# Patient Record
Sex: Female | Born: 1945
Health system: Southern US, Community
[De-identification: ages and names within clinical notes are randomized; demographics above are authoritative.]

## PROBLEM LIST (undated history)

## (undated) DIAGNOSIS — R059 Cough, unspecified: Secondary | ICD-10-CM

## (undated) DIAGNOSIS — K644 Residual hemorrhoidal skin tags: Secondary | ICD-10-CM

## (undated) DIAGNOSIS — I10 Essential (primary) hypertension: Secondary | ICD-10-CM

## (undated) DIAGNOSIS — K219 Gastro-esophageal reflux disease without esophagitis: Secondary | ICD-10-CM

## (undated) DIAGNOSIS — K589 Irritable bowel syndrome without diarrhea: Secondary | ICD-10-CM

## (undated) DIAGNOSIS — G4730 Sleep apnea, unspecified: Secondary | ICD-10-CM

## (undated) DIAGNOSIS — F329 Major depressive disorder, single episode, unspecified: Secondary | ICD-10-CM

## (undated) DIAGNOSIS — R112 Nausea with vomiting, unspecified: Secondary | ICD-10-CM

## (undated) DIAGNOSIS — L719 Rosacea, unspecified: Secondary | ICD-10-CM

## (undated) DIAGNOSIS — F419 Anxiety disorder, unspecified: Secondary | ICD-10-CM

## (undated) DIAGNOSIS — E785 Hyperlipidemia, unspecified: Secondary | ICD-10-CM

## (undated) DIAGNOSIS — T783XXA Angioneurotic edema, initial encounter: Secondary | ICD-10-CM

## (undated) DIAGNOSIS — F32A Depression, unspecified: Secondary | ICD-10-CM

## (undated) DIAGNOSIS — M1990 Unspecified osteoarthritis, unspecified site: Secondary | ICD-10-CM

## (undated) DIAGNOSIS — R05 Cough: Secondary | ICD-10-CM

## (undated) DIAGNOSIS — T782XXA Anaphylactic shock, unspecified, initial encounter: Principal | ICD-10-CM

## (undated) DIAGNOSIS — K648 Other hemorrhoids: Secondary | ICD-10-CM

## (undated) DIAGNOSIS — Z205 Contact with and (suspected) exposure to viral hepatitis: Secondary | ICD-10-CM

## (undated) DIAGNOSIS — Z9889 Other specified postprocedural states: Secondary | ICD-10-CM

## (undated) HISTORY — DX: Irritable bowel syndrome, unspecified: K58.9

## (undated) HISTORY — DX: Unspecified osteoarthritis, unspecified site: M19.90

## (undated) HISTORY — DX: Anaphylactic shock, unspecified, initial encounter: T78.2XXA

## (undated) HISTORY — PX: COSMETIC SURGERY: SHX468

## (undated) HISTORY — DX: Other hemorrhoids: K64.8

## (undated) HISTORY — DX: Residual hemorrhoidal skin tags: K64.4

## (undated) HISTORY — DX: Hyperlipidemia, unspecified: E78.5

## (undated) HISTORY — DX: Cough, unspecified: R05.9

## (undated) HISTORY — DX: Angioneurotic edema, initial encounter: T78.3XXA

## (undated) HISTORY — DX: Anxiety disorder, unspecified: F41.9

## (undated) HISTORY — DX: Rosacea, unspecified: L71.9

## (undated) HISTORY — DX: Sleep apnea, unspecified: G47.30

## (undated) HISTORY — DX: Cough: R05

## (undated) HISTORY — DX: Essential (primary) hypertension: I10

## (undated) HISTORY — DX: Contact with and (suspected) exposure to viral hepatitis: Z20.5

## (undated) HISTORY — DX: Depression, unspecified: F32.A

## (undated) HISTORY — DX: Major depressive disorder, single episode, unspecified: F32.9

## (undated) HISTORY — PX: TOTAL SHOULDER REPLACEMENT: SUR1217

---
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
## 1998-01-28 ENCOUNTER — Encounter: Admission: RE | Admit: 1998-01-28 | Discharge: 1998-04-28 | Payer: Self-pay | Admitting: Family Medicine

## 1998-09-26 ENCOUNTER — Ambulatory Visit (HOSPITAL_BASED_OUTPATIENT_CLINIC_OR_DEPARTMENT_OTHER): Admission: RE | Admit: 1998-09-26 | Discharge: 1998-09-26 | Payer: Self-pay | Admitting: Specialist

## 1998-10-15 HISTORY — PX: ABDOMINOPLASTY: SUR9

## 1999-02-23 ENCOUNTER — Other Ambulatory Visit: Admission: RE | Admit: 1999-02-23 | Discharge: 1999-02-23 | Payer: Self-pay | Admitting: Family Medicine

## 2000-03-18 ENCOUNTER — Encounter: Payer: Self-pay | Admitting: Family Medicine

## 2000-03-18 ENCOUNTER — Encounter: Admission: RE | Admit: 2000-03-18 | Discharge: 2000-03-18 | Payer: Self-pay | Admitting: Family Medicine

## 2000-10-15 HISTORY — PX: REDUCTION MAMMAPLASTY: SUR839

## 2001-02-26 ENCOUNTER — Other Ambulatory Visit: Admission: RE | Admit: 2001-02-26 | Discharge: 2001-02-26 | Payer: Self-pay | Admitting: Obstetrics and Gynecology

## 2002-03-05 ENCOUNTER — Encounter: Payer: Self-pay | Admitting: Family Medicine

## 2002-03-05 ENCOUNTER — Other Ambulatory Visit: Admission: RE | Admit: 2002-03-05 | Discharge: 2002-03-05 | Payer: Self-pay | Admitting: *Deleted

## 2002-07-16 ENCOUNTER — Encounter: Admission: RE | Admit: 2002-07-16 | Discharge: 2002-07-16 | Payer: Self-pay | Admitting: *Deleted

## 2002-07-16 ENCOUNTER — Encounter: Payer: Self-pay | Admitting: *Deleted

## 2002-07-22 ENCOUNTER — Inpatient Hospital Stay (HOSPITAL_COMMUNITY): Admission: EM | Admit: 2002-07-22 | Discharge: 2002-07-23 | Payer: Self-pay | Admitting: Emergency Medicine

## 2002-07-23 ENCOUNTER — Encounter (INDEPENDENT_AMBULATORY_CARE_PROVIDER_SITE_OTHER): Payer: Self-pay | Admitting: *Deleted

## 2002-10-15 HISTORY — PX: APPENDECTOMY: SHX54

## 2003-04-27 ENCOUNTER — Encounter: Admission: RE | Admit: 2003-04-27 | Discharge: 2003-07-26 | Payer: Self-pay | Admitting: *Deleted

## 2004-05-12 ENCOUNTER — Encounter: Payer: Self-pay | Admitting: Family Medicine

## 2004-05-12 ENCOUNTER — Other Ambulatory Visit: Admission: RE | Admit: 2004-05-12 | Discharge: 2004-05-12 | Payer: Self-pay | Admitting: *Deleted

## 2004-05-24 ENCOUNTER — Encounter: Admission: RE | Admit: 2004-05-24 | Discharge: 2004-05-24 | Payer: Self-pay | Admitting: *Deleted

## 2005-12-19 ENCOUNTER — Ambulatory Visit (HOSPITAL_COMMUNITY): Admission: RE | Admit: 2005-12-19 | Discharge: 2005-12-19 | Payer: Self-pay | Admitting: *Deleted

## 2006-02-20 ENCOUNTER — Encounter: Admission: RE | Admit: 2006-02-20 | Discharge: 2006-02-20 | Payer: Self-pay | Admitting: Sports Medicine

## 2006-03-08 ENCOUNTER — Encounter: Admission: RE | Admit: 2006-03-08 | Discharge: 2006-03-08 | Payer: Self-pay | Admitting: Sports Medicine

## 2007-01-17 ENCOUNTER — Ambulatory Visit (HOSPITAL_COMMUNITY): Admission: RE | Admit: 2007-01-17 | Discharge: 2007-01-17 | Payer: Self-pay | Admitting: Pediatric Nephrology

## 2007-10-16 HISTORY — PX: TOTAL SHOULDER REPLACEMENT: SUR1217

## 2008-01-01 ENCOUNTER — Encounter (INDEPENDENT_AMBULATORY_CARE_PROVIDER_SITE_OTHER): Payer: Self-pay | Admitting: *Deleted

## 2008-01-01 LAB — CONVERTED CEMR LAB
AST: 26 units/L
Albumin: 4 g/dL
BUN: 22 mg/dL
Glucose, Bld: 91 mg/dL
HDL: 46 mg/dL
LDL Cholesterol: 135 mg/dL
Sodium, serum: 140 mmol/L
Triglycerides: 179 mg/dL

## 2008-07-22 ENCOUNTER — Ambulatory Visit (HOSPITAL_COMMUNITY): Admission: RE | Admit: 2008-07-22 | Discharge: 2008-07-22 | Payer: Self-pay | Admitting: *Deleted

## 2008-08-16 ENCOUNTER — Encounter (INDEPENDENT_AMBULATORY_CARE_PROVIDER_SITE_OTHER): Payer: Self-pay | Admitting: *Deleted

## 2008-08-20 ENCOUNTER — Ambulatory Visit: Payer: Self-pay | Admitting: Family Medicine

## 2008-08-20 DIAGNOSIS — I10 Essential (primary) hypertension: Secondary | ICD-10-CM | POA: Insufficient documentation

## 2008-08-20 DIAGNOSIS — E785 Hyperlipidemia, unspecified: Secondary | ICD-10-CM

## 2008-08-20 DIAGNOSIS — F32A Depression, unspecified: Secondary | ICD-10-CM | POA: Insufficient documentation

## 2008-08-20 DIAGNOSIS — F329 Major depressive disorder, single episode, unspecified: Secondary | ICD-10-CM

## 2008-08-23 ENCOUNTER — Encounter (INDEPENDENT_AMBULATORY_CARE_PROVIDER_SITE_OTHER): Payer: Self-pay | Admitting: *Deleted

## 2008-08-24 ENCOUNTER — Ambulatory Visit: Payer: Self-pay | Admitting: Family Medicine

## 2008-08-24 ENCOUNTER — Encounter (INDEPENDENT_AMBULATORY_CARE_PROVIDER_SITE_OTHER): Payer: Self-pay | Admitting: *Deleted

## 2008-08-25 LAB — CONVERTED CEMR LAB
BUN: 24 mg/dL — ABNORMAL HIGH (ref 6–23)
CO2: 29 meq/L (ref 19–32)
Calcium: 9.4 mg/dL (ref 8.4–10.5)
Chloride: 106 meq/L (ref 96–112)
Cholesterol: 156 mg/dL (ref 0–200)
Creatinine, Ser: 1 mg/dL (ref 0.4–1.2)
Eosinophils Relative: 1.6 % (ref 0.0–5.0)
Glucose, Bld: 99 mg/dL (ref 70–99)
HDL: 38.8 mg/dL — ABNORMAL LOW (ref >39.0)
Monocytes Absolute: 0.4 10*3/uL (ref 0.1–1.0)
Neutrophils Relative %: 46.7 % (ref 43.0–77.0)
Platelets: 222 10*3/uL (ref 150–400)
Potassium: 4.7 meq/L (ref 3.5–5.1)
RBC: 4.18 M/uL (ref 3.87–5.11)
Sodium: 140 meq/L (ref 135–145)
Total CHOL/HDL Ratio: 4
Triglycerides: 86 mg/dL (ref 0–149)
WBC: 5.9 10*3/uL (ref 4.5–10.5)

## 2008-08-27 ENCOUNTER — Encounter (INDEPENDENT_AMBULATORY_CARE_PROVIDER_SITE_OTHER): Payer: Self-pay | Admitting: *Deleted

## 2008-09-01 ENCOUNTER — Ambulatory Visit: Payer: Self-pay | Admitting: Internal Medicine

## 2008-09-02 ENCOUNTER — Other Ambulatory Visit: Admission: RE | Admit: 2008-09-02 | Discharge: 2008-09-02 | Payer: Self-pay | Admitting: Obstetrics & Gynecology

## 2008-09-15 ENCOUNTER — Ambulatory Visit: Payer: Self-pay | Admitting: Internal Medicine

## 2008-09-15 HISTORY — PX: COLONOSCOPY: SHX5424

## 2008-09-20 ENCOUNTER — Ambulatory Visit: Payer: Self-pay | Admitting: Family Medicine

## 2009-03-21 ENCOUNTER — Encounter (INDEPENDENT_AMBULATORY_CARE_PROVIDER_SITE_OTHER): Payer: Self-pay | Admitting: *Deleted

## 2009-05-02 ENCOUNTER — Ambulatory Visit: Payer: Self-pay | Admitting: Family Medicine

## 2009-05-31 ENCOUNTER — Telehealth (INDEPENDENT_AMBULATORY_CARE_PROVIDER_SITE_OTHER): Payer: Self-pay | Admitting: *Deleted

## 2009-06-01 ENCOUNTER — Ambulatory Visit: Payer: Self-pay | Admitting: Internal Medicine

## 2009-10-04 ENCOUNTER — Ambulatory Visit (HOSPITAL_COMMUNITY): Admission: RE | Admit: 2009-10-04 | Discharge: 2009-10-04 | Payer: Self-pay | Admitting: Obstetrics & Gynecology

## 2009-10-13 ENCOUNTER — Encounter: Admission: RE | Admit: 2009-10-13 | Discharge: 2009-10-13 | Payer: Self-pay | Admitting: Obstetrics & Gynecology

## 2009-10-15 HISTORY — PX: AUGMENTATION MAMMAPLASTY: SUR837

## 2010-01-13 ENCOUNTER — Telehealth (INDEPENDENT_AMBULATORY_CARE_PROVIDER_SITE_OTHER): Payer: Self-pay | Admitting: *Deleted

## 2010-09-06 ENCOUNTER — Encounter: Payer: Self-pay | Admitting: Family Medicine

## 2010-09-06 ENCOUNTER — Encounter (INDEPENDENT_AMBULATORY_CARE_PROVIDER_SITE_OTHER): Payer: Self-pay | Admitting: *Deleted

## 2010-09-06 LAB — CONVERTED CEMR LAB
ALT: 30 units/L
AST: 26 units/L
Albumin: 3.9 g/dL
Alkaline Phosphatase: 78 units/L
BUN: 10 mg/dL
CO2, serum: 30 mmol/L
Chloride, Serum: 104 mmol/L
Glucose, Bld: 87 mg/dL
Potassium, serum: 4.5 mmol/L
Sodium, serum: 140 mmol/L
Total Bilirubin: 0.6 mg/dL
Total Protein: 7 g/dL
WBC, blood: 8.3 10*3/uL

## 2010-09-18 ENCOUNTER — Telehealth (INDEPENDENT_AMBULATORY_CARE_PROVIDER_SITE_OTHER): Payer: Self-pay | Admitting: *Deleted

## 2010-09-18 ENCOUNTER — Ambulatory Visit (HOSPITAL_BASED_OUTPATIENT_CLINIC_OR_DEPARTMENT_OTHER)
Admission: RE | Admit: 2010-09-18 | Discharge: 2010-09-18 | Payer: Self-pay | Source: Home / Self Care | Admitting: Family Medicine

## 2010-09-18 ENCOUNTER — Ambulatory Visit: Payer: Self-pay | Admitting: Family Medicine

## 2010-09-18 ENCOUNTER — Encounter (INDEPENDENT_AMBULATORY_CARE_PROVIDER_SITE_OTHER): Payer: Self-pay | Admitting: *Deleted

## 2010-09-18 ENCOUNTER — Encounter: Payer: Self-pay | Admitting: Family Medicine

## 2010-09-18 DIAGNOSIS — K219 Gastro-esophageal reflux disease without esophagitis: Secondary | ICD-10-CM | POA: Insufficient documentation

## 2010-10-26 ENCOUNTER — Telehealth (INDEPENDENT_AMBULATORY_CARE_PROVIDER_SITE_OTHER): Payer: Self-pay | Admitting: *Deleted

## 2010-11-01 ENCOUNTER — Encounter: Payer: Self-pay | Admitting: Family Medicine

## 2010-11-01 ENCOUNTER — Other Ambulatory Visit: Payer: Self-pay | Admitting: Family Medicine

## 2010-11-01 ENCOUNTER — Ambulatory Visit
Admission: RE | Admit: 2010-11-01 | Discharge: 2010-11-01 | Payer: Self-pay | Source: Home / Self Care | Attending: Family Medicine | Admitting: Family Medicine

## 2010-11-01 LAB — LIPID PANEL
Cholesterol: 222 mg/dL — ABNORMAL HIGH (ref 0–200)
HDL: 52.9 mg/dL (ref >39.00)
Total CHOL/HDL Ratio: 4
Triglycerides: 144 mg/dL (ref 0.0–149.0)
VLDL: 28.8 mg/dL (ref 0.0–40.0)

## 2010-11-01 LAB — LDL CHOLESTEROL, DIRECT: Direct LDL: 143.9 mg/dL

## 2010-11-01 LAB — TSH: TSH: 1.31 u[IU]/mL (ref 0.35–5.50)

## 2010-11-02 ENCOUNTER — Telehealth (INDEPENDENT_AMBULATORY_CARE_PROVIDER_SITE_OTHER): Payer: Self-pay | Admitting: *Deleted

## 2010-11-02 ENCOUNTER — Encounter: Payer: Self-pay | Admitting: Family Medicine

## 2010-11-05 ENCOUNTER — Encounter: Payer: Self-pay | Admitting: Obstetrics & Gynecology

## 2010-11-14 NOTE — Miscellaneous (Signed)
  Clinical Lists Changes  Observations: Added new observation of BILI TOTAL: 0.6 mg/dL (66/03/3015 01:09) Added new observation of ALK PHOS: 78 units/L (09/06/2010 14:32) Added new observation of SGPT (ALT): 30 units/L (09/06/2010 14:32) Added new observation of SGOT (AST): 26 units/L (09/06/2010 14:32) Added new observation of PROTEIN, TOT: 7.0 g/dL (32/35/5732 20:25) Added new observation of ALBUMIN: 3.9 g/dL (42/70/6237 62:83) Added new observation of CALCIUM: 9.6 mg/dL (15/17/6160 73:71) Added new observation of GLUCOSE SER: 87 mg/dL (04/09/9484 46:27) Added new observation of CREATININE: 1.0 mg/dL (03/50/0938 18:29) Added new observation of BUN: 10 mg/dL (93/71/6967 89:38) Added new observation of CO2 TOTAL: 30 mmol/L (09/06/2010 14:32) Added new observation of CHLORIDE: 104 mmol/L (09/06/2010 14:32) Added new observation of POTASSIUM: 4.5 mmol/L (09/06/2010 14:32) Added new observation of SODIUM: 140 mmol/L (09/06/2010 14:32) Added new observation of PLATELETS: 223 10*3/mm3 (09/06/2010 14:32) Added new observation of MCH: 30.4 pg (09/06/2010 14:32) Added new observation of MCV: 14.4 fL (09/06/2010 14:32) Added new observation of HCT: 40.4 % (09/06/2010 14:32) Added new observation of HGB: 13.9 g/dL (07/31/5101 58:52) Added new observation of RBC: 4.57 10*6/mm3 (09/06/2010 14:32) Added new observation of WBC: 8.3 10*3/mm3 (09/06/2010 14:32)

## 2010-11-14 NOTE — Progress Notes (Signed)
Summary: relpax refill   Phone Note Refill Request Message from:  Fax from Pharmacy on January 13, 2010 4:46 PM  Refills Requested: Medication #1:  RELPAX 20 MG TABS take 1 tablet at onset may repeat in 2 hrs. after first dose Initial call taken by: Doristine Devoid,  January 13, 2010 4:46 PM    Prescriptions: RELPAX 20 MG TABS (ELETRIPTAN HYDROBROMIDE) take 1 tablet at onset may repeat in 2 hrs. after first dose  #3 month x 0   Entered by:   Doristine Devoid   Authorized by:   Neena Rhymes MD   Signed by:   Doristine Devoid on 01/13/2010   Method used:   Electronically to        [facility]* (mail-order)             ,          Ph: [phone]       Fax: [phone]   RxID:   [id number]

## 2010-11-14 NOTE — Assessment & Plan Note (Signed)
Summary: cpx/ekg//ph PT HAVING SX 120611/CBS   Vital Signs:  Patient profile:   65 year old female Height:      62.50 inches Weight:      162 pounds BMI:     29.26 Pulse rate:   95 / minute BP sitting:   110 / 80  (left arm)  Vitals Entered By: Doristine Devoid CMA (September 18, 2010 10:39 AM) CC: CPX going to have cosmetic surgery , Pre-op Evaluation   History of Present Illness: 65 yo woman here today for CPE.  GYNHyacinth Meeker, UTD pap/mammo.  UTD on colonoscopy (2009).  no concerns today.   Pre-op Evaluation      I was asked to see this delightful patient today for Pre-op Evaluation.  The patient complains of respiratory symptoms (cough- most likely related to GERD), but denies GI bleeding, chest pain, edema, PND, heavy ETOH use, and smoking.  Patient has no history of acute or recent MI, unstable or severe angina, decompensated CHF, high grade AV block, symptomatic ventricular arrhythmia, and severe valvular disease.  Patient has no history of mild angina(m), previous MI(m), compensated CHF(m), diabetes(m), renal insufficiency(m), advanced age(l), low functional capacity(l), stroke history(l), and uncontrolled HTN(l).  Conditions requiring action prior to surgery include FH anesthesia reaction (mom had allergic reaction to anesthesia but patient has tolerated w/out difficulty).  There is no history of antiplatelet agents, chronic steroids, warfarin, diabetes meds, antianginal meds, and bleeding disorder.    Preventive Screening-Counseling & Management  Alcohol-Tobacco     Alcohol drinks/day: <1     Smoking Status: never  Caffeine-Diet-Exercise     Does Patient Exercise: yes     Type of exercise: yoga, cardio     Times/week: 3      Drug Use:  never.    Current Medications (verified): 1)  Alprazolam 0.5 Mg Tabs (Alprazolam) .... Take One Tablet Daily 2)  Minocycline Hcl 100 Mg Tabs (Minocycline Hcl) .... As Needed 3)  Fluoxetine Hcl 40 Mg Caps (Fluoxetine Hcl) .... Take One Tablet  Daily 4)  Relpax 20 Mg Tabs (Eletriptan Hydrobromide) .... Take 1 Tablet At Onset May Repeat in 2 Hrs. After First Dose 5)  Premarin 0.625 Mg/gm Crea (Estrogens, Conjugated) .... Per Gyn 6)  Omeprazole 40 Mg Cpdr (Omeprazole) .Marland Kitchen.. 1 Tab By Mouth Daily  Allergies (verified): 1)  Ace Inhibitors  Past History:  Past medical, surgical, family and social histories (including risk factors) reviewed, and no changes noted (except as noted below).  Past Medical History: Reviewed history from 08/20/2008 and no changes required. HTN Hyperlipidemia Migraines Depression- Dr Raquel James (psych) has GYN  Past Surgical History: Reviewed history from 08/20/2008 and no changes required. Appendectomy 2004 Tonsils as child Shoulder replacement 2009  Family History: Reviewed history from 08/20/2008 and no changes required. Mother- hyperlipidemia Father- DM, HTN, died of MI in 43s Sister- obese, renal problems Kids- healthy Colon Cancer- none Breast Cancer- none  Social History: Reviewed history from 08/20/2008 and no changes required. Masters level education, works as Engineer, manufacturing systems Divorced, 2 children (82, 79) living locally No tobacco, red wine dailyDrug Use:  never  Review of Systems       The patient complains of prolonged cough and severe indigestion/heartburn.  The patient denies anorexia, fever, weight loss, weight gain, vision loss, decreased hearing, hoarseness, chest pain, syncope, dyspnea on exertion, peripheral edema, headaches, abdominal pain, melena, hematochezia, hematuria, suspicious skin lesions, depression, abnormal bleeding, enlarged lymph nodes, and breast masses.    Physical Exam  General:  well-nourished,in no acute distress; alert,appropriate and cooperative throughout examination Head:  Normocephalic and atraumatic without obvious abnormalities. No apparent alopecia or balding. Eyes:  No corneal or conjunctival inflammation noted. EOMI. Perrla.  Funduscopic exam benign, without hemorrhages, exudates or papilledema. Vision grossly normal. Ears:  External ear exam shows no significant lesions or deformities.  Otoscopic examination reveals clear canals, tympanic membranes are intact bilaterally without bulging, retraction, inflammation or discharge. Hearing is grossly normal bilaterally. Nose:  External nasal examination shows no deformity or inflammation. Nasal mucosa are pink and moist without lesions or exudates. Mouth:  Oral mucosa and oropharynx without lesions or exudates.  Teeth in good repair. Neck:  No deformities, masses, or tenderness noted. Breasts:  deferred to gyn Lungs:  Normal respiratory effort, chest expands symmetrically. Lungs are clear to auscultation, no crackles or wheezes. Heart:  reg S1/S2, no M/R/G Abdomen:  Bowel sounds positive,abdomen soft and non-tender without masses, organomegaly or hernias noted. Genitalia:  deferred to GYN Pulses:  +2 carotid, radial, DP Extremities:  No clubbing, cyanosis, edema, or deformity noted Neurologic:  No cranial nerve deficits noted. Station and gait are normal. Plantar reflexes are down-going bilaterally. DTRs are symmetrical throughout. Sensory, motor and coordinative functions appear intact. Skin:  Intact without suspicious lesions or rashes Cervical Nodes:  No lymphadenopathy noted Psych:  Cognition and judgment appear intact. Alert and cooperative with normal attention span and concentration. No apparent delusions, illusions, hallucinations   Impression & Recommendations:  Problem # 1:  PHYSICAL EXAMINATION (ICD-V70.0) Assessment New pt's PE WNL.  no abnormalities identified.  EKG WNL.  pt is cleared for surgery at this time. Orders: EKG w/ Interpretation (93000)  Problem # 2:  COUGH (ICD-786.2) Assessment: New most likely related to reflux but given fact she has surgery tomorrow will check CXR.  start PPI. Orders: T-2 View CXR (71020TC)  Problem # 3:  GERD  (ICD-530.81) Assessment: New most likely cause of cough.  start PPI. Her updated medication list for this problem includes:    Omeprazole 40 Mg Cpdr (Omeprazole) .Marland Kitchen... 1 tab by mouth daily  Complete Medication List: 1)  Alprazolam 0.5 Mg Tabs (Alprazolam) .... Take one tablet daily 2)  Minocycline Hcl 100 Mg Tabs (Minocycline hcl) .... As needed 3)  Fluoxetine Hcl 40 Mg Caps (Fluoxetine hcl) .... Take one tablet daily 4)  Relpax 20 Mg Tabs (Eletriptan hydrobromide) .... Take 1 tablet at onset may repeat in 2 hrs. after first dose 5)  Premarin 0.625 Mg/gm Crea (Estrogens, conjugated) .... Per gyn 6)  Omeprazole 40 Mg Cpdr (Omeprazole) .Marland Kitchen.. 1 tab by mouth daily  Patient Instructions: 1)  [facility] to [facility] on [address] to get your chest xray- we'll call you with the results 2)  Keep up the good work on diet and exercise- your exam looks great! 3)  We'll notify you of your lab results 4)  Take the Omeprazole daily for your heartburn- this will also help with your cough 5)  Good luck tomorrow! 6)  Happy Holidays! Prescriptions: OMEPRAZOLE 40 MG CPDR (OMEPRAZOLE) 1 tab by mouth daily  #30 x 3   Entered and Authorized by:   Neena Rhymes MD   Signed by:   Neena Rhymes MD on 09/18/2010   Method used:   Electronically to        [facility]* (retail)       [address]       [place], [place]  [address]  Ph: [phone]       Fax: [phone]   RxID:   [id number] PREMARIN 0.625 MG/GM CREA (ESTROGENS, CONJUGATED) per GYN  #0 x 0   Entered and Authorized by:   Neena Rhymes MD   Signed by:   Neena Rhymes MD on 09/18/2010   Method used:   Historical   RxID:   [id number]    Orders Added: 1)  T-2 View CXR [71020TC] 2)  EKG w/ Interpretation [93000] 3)  Est. Patient 40-64 years [72536]

## 2010-11-14 NOTE — Miscellaneous (Signed)
Summary: [facility] Previsit/prep  Clinical Lists Changes  Medications: Added new medication of DULCOLAX 5 MG  TBEC (BISACODYL) Day before procedure take 2 at 3pm and 2 at 8pm. - Signed Added new medication of METOCLOPRAMIDE HCL 10 MG  TABS (METOCLOPRAMIDE HCL) As per prep instructions. - Signed Added new medication of MIRALAX   POWD (POLYETHYLENE GLYCOL 3350) As per prep  instructions. - Signed Rx of DULCOLAX 5 MG  TBEC (BISACODYL) Day before procedure take 2 at 3pm and 2 at 8pm.;  #4 x 0;  Signed;  Entered by: Wyona Almas RN;  Authorized by: Iva Boop MD;  Method used: Electronically to [facility] [address] [place], [place]  [address], Ph: [phone], Fax: [phone] Rx of METOCLOPRAMIDE HCL 10 MG  TABS (METOCLOPRAMIDE HCL) As per prep instructions.;  #2 x 0;  Signed;  Entered by: Wyona Almas RN;  Authorized by: Iva Boop MD;  Method used: Electronically to [facility]*, [address], [place], [place]  [address], Ph: [phone], Fax: [phone] Rx of MIRALAX   POWD (POLYETHYLENE GLYCOL 3350) As per prep  instructions.;  #255gm x 0;  Signed;  Entered by: Wyona Almas RN;  Authorized by: Iva Boop MD;  Method used: Electronically to [facility]*, [address] [place], [place]  [address], Ph: [phone], Fax: [phone] Observations: Added new observation of ALLERGY REV: Done (09/01/2008 10:31)    Prescriptions: MIRALAX   POWD (POLYETHYLENE GLYCOL 3350) As per prep  instructions.  #255gm x 0   Entered by:   Wyona Almas RN   Authorized by:   Iva Boop MD   Signed by:   Wyona Almas RN on 09/01/2008   Method used:   Electronically to        [facility]* (retail)       [address]       [place], [place]  [address]       Ph: [phone]       Fax: [phone]   RxID:   [id number] METOCLOPRAMIDE HCL 10 MG  TABS (METOCLOPRAMIDE HCL) As per prep instructions.  #2 x 0   Entered by:    Wyona Almas RN   Authorized by:   Iva Boop MD   Signed by:   Wyona Almas RN on 09/01/2008   Method used:   Electronically to        [facility]* (retail)       [address]       [place], [place]  [address]       Ph: [phone]       Fax: [phone]   RxID:   [id number] DULCOLAX 5 MG  TBEC (BISACODYL) Day before procedure take 2 at 3pm and 2 at 8pm.  #4 x 0   Entered by:   Wyona Almas RN   Authorized by:   Iva Boop MD   Signed by:   Wyona Almas RN on 09/01/2008   Method used:   Electronically to        [facility]* (retail)       [address]       [place], [place]  [address]       Ph: [phone]       Fax: [phone]   RxID:   [id number]

## 2010-11-14 NOTE — Progress Notes (Signed)
Summary: schedule labs  Phone Note Outgoing Call   Call placed by: Doristine Devoid CMA,  September 18, 2010 2:40 PM Call placed to: Patient Summary of Call: called patient to informed that we receieved labs from [facility] she just needs to schedule appt for fasting labs need LIPID, TSH-V70.0, VIT D-627.9 ok to wait until after surgery   left message on machine .........Marland KitchenDoristine Devoid CMA  September 18, 2010 2:43 PM   Follow-up for Phone Call        l patient informed will f/u after surgery........Marland KitchenDoristine Devoid CMA  September 18, 2010 3:28 PM

## 2010-11-16 NOTE — Progress Notes (Signed)
Summary: Prior Auth MED CHANGED   Phone Note Refill Request Call back at [phone] Message from:  Pharmacy on October 26, 2010 4:01 PM  Refills Requested: Medication #1:  OMEPRAZOLE 40 MG CPDR 1 tab by mouth daily.   Dosage confirmed as above?Dosage Confirmed Prior Auth  Next Appointment Scheduled: none Initial call taken by: Harold Barban,  October 26, 2010 4:02 PM  Follow-up for Phone Call        Tried to call Pt no VM to leave message need to verify if Pt has tried any other meds................Marland KitchenFelecia Deloach CMA  October 26, 2010 4:35 PM   Pt states that she has not tried any other meds besides current med...........Marland KitchenFelecia Deloach CMA  October 27, 2010 9:00 AM   Per Dr Beverely Low Pt to try OTC prilosec and if no relief Pt instructed to give [place] a call so that we may initiate PA to get med approved by insurance....Marland KitchenMarland KitchenFelecia Deloach CMA  October 27, 2010 9:56 AM     New/Updated Medications: PRILOSEC OTC 20 MG TBEC (OMEPRAZOLE MAGNESIUM) Take 1 tab once daily

## 2010-11-16 NOTE — Progress Notes (Signed)
Summary: labs-  Phone Note Outgoing Call   Call placed by: Doristine Devoid CMA,  November 02, 2010 3:05 PM Call placed to: Patient Summary of Call: total cholesterol and LDL are both elevated.  would recommend Simvastatin 20mg  at bedtime and recheck LFTs in 6-8 weeks.  Follow-up for Phone Call        spoke w/ patient aware of labs and that medication is to be started but would like to try diet and exercise first wanted to know if this was ok w/ Dr. Beverely Low...Marland KitchenMarland KitchenDoristine Devoid CMA  November 02, 2010 3:06 PM   Additional Follow-up for Phone Call Additional follow up Details #1::        ok to attempt for 6 months and then recheck Additional Follow-up by: Neena Rhymes MD,  November 02, 2010 3:23 PM    Additional Follow-up for Phone Call Additional follow up Details #2::    patient aware ....Marland KitchenMarland KitchenDoristine Devoid CMA  November 02, 2010 3:36 PM

## 2010-11-16 NOTE — Assessment & Plan Note (Signed)
Summary: cough x1 month/cdj   Vital Signs:  Patient profile:   65 year old female Weight:      162 pounds BMI:     29.26 O2 Sat:      98 % on Room air Pulse rate:   88 / minute BP sitting:   114 / 70  (left arm)  Vitals Entered By: Doristine Devoid CMA (November 01, 2010 9:12 AM)  O2 Flow:  Room air CC: dry hacking cough x1 month worse at night   History of Present Illness: 65 yo woman here today for cough.  sxs started 1 month ago.  2 weeks ago developed sinus congestion and pressure.  used Mucinex w/ good relief.  cough is intermittantly productive.  used PPI w/out results.  denies PND.  no fevers.  cough is worse in the evenings.  + sick contacts.  recent CXR was normal.  did have ET anesthesia recently- which in the past has caused airway inflammation for her.  Current Medications (verified): 1)  Alprazolam 0.5 Mg Tabs (Alprazolam) .... Take One Tablet Daily 2)  Minocycline Hcl 100 Mg Tabs (Minocycline Hcl) .... As Needed 3)  Fluoxetine Hcl 40 Mg Caps (Fluoxetine Hcl) .... Take One Tablet Daily 4)  Relpax 20 Mg Tabs (Eletriptan Hydrobromide) .... Take 1 Tablet At Onset May Repeat in 2 Hrs. After First Dose 5)  Premarin 0.625 Mg/gm Crea (Estrogens, Conjugated) .... Per Gyn  Allergies (verified): 1)  Ace Inhibitors  Review of Systems      See HPI  Physical Exam  General:  well-nourished,in no acute distress; alert,appropriate and cooperative throughout examination Ears:  External ear exam shows no significant lesions or deformities.  Otoscopic examination reveals clear canals, tympanic membranes are intact bilaterally without bulging, retraction, inflammation or discharge. Hearing is grossly normal bilaterally. Nose:  mild turbinate edema Mouth:  Oral mucosa and oropharynx without lesions or exudates.  Teeth in good repair. Neck:  No deformities, masses, or tenderness noted. Lungs:  Normal respiratory effort, chest expands symmetrically. Lungs are clear to auscultation, no  crackles or wheezes.  occasional dry cough Heart:  reg S1/S2, no M/R/G   Impression & Recommendations:  Problem # 1:  COUGH (ICD-786.2) Assessment New most likely cause would be GERD or PND.  since pt is on PPI will start nasal steroid given presence of turbinate edema.  recent cxr normal.  if no improvement on meds will refer for further evaluation.  Pt expresses understanding and is in agreement w/ this plan.  Complete Medication List: 1)  Alprazolam 0.5 Mg Tabs (Alprazolam) .... Take one tablet daily 2)  Minocycline Hcl 100 Mg Tabs (Minocycline hcl) .... As needed 3)  Fluoxetine Hcl 40 Mg Caps (Fluoxetine hcl) .... Take one tablet daily 4)  Relpax 20 Mg Tabs (Eletriptan hydrobromide) .... Take 1 tablet at onset may repeat in 2 hrs. after first dose 5)  Premarin 0.625 Mg/gm Crea (Estrogens, conjugated) .... Per gyn 6)  Nasonex 50 Mcg/act Susp (Mometasone furoate) .... 2 sprays each nostril once daily  Other Orders: Venipuncture ([address]) TLB-Lipid Panel (80061-LIPID) TLB-TSH (Thyroid Stimulating Hormone) (84443-TSH) T-Vitamin D (25-Hydroxy) ([address]) Specimen Handling (91478)  Patient Instructions: 1)  If no improvement in the cough in the next 2 weeks- please call 2)  Continue the Prilosec 3)  Add the nasal spray- 2 sprays each nostril daily- to decrease the post nasal drip 4)  If it is airway irritation from the anesthesia, ibuprofen will help 5)  Call with any questions or concerns  6)  Hang in there! 7)  Happy New Year! Prescriptions: NASONEX 50 MCG/ACT SUSP (MOMETASONE FUROATE) 2 sprays each nostril once daily  #1 x 3   Entered and Authorized by:   Neena Rhymes MD   Signed by:   Neena Rhymes MD on 11/01/2010   Method used:   Electronically to        [facility] [phone]* (retail)       [address]       [place], [place]  [address]       Ph: [phone]       Fax: [phone]   RxID:   [id number]    Orders Added: 1)   Venipuncture [27253] 2)  TLB-Lipid Panel [80061-LIPID] 3)  TLB-TSH (Thyroid Stimulating Hormone) [84443-TSH] 4)  T-Vitamin D (25-Hydroxy) [66440-34742] 5)  Specimen Handling [99000] 6)  Est. Patient Level III [59563]

## 2010-11-22 NOTE — Medication Information (Signed)
Summary: Action on Omeprazole Request Discontinued/[facility]  Action on Omeprazole Request Discontinued/[facility]   Imported By: Lanelle Bal 11/13/2010 11:49:12  _____________________________________________________________________  External Attachment:    Type:   Image     Comment:   External Document

## 2010-11-27 ENCOUNTER — Encounter: Payer: Self-pay | Admitting: Family Medicine

## 2010-11-27 ENCOUNTER — Ambulatory Visit (INDEPENDENT_AMBULATORY_CARE_PROVIDER_SITE_OTHER): Payer: BC Managed Care – PPO | Admitting: Family Medicine

## 2010-11-27 DIAGNOSIS — R05 Cough: Secondary | ICD-10-CM

## 2010-11-27 DIAGNOSIS — K219 Gastro-esophageal reflux disease without esophagitis: Secondary | ICD-10-CM

## 2010-11-29 ENCOUNTER — Telehealth: Payer: Self-pay | Admitting: Family Medicine

## 2010-11-30 ENCOUNTER — Encounter: Payer: Self-pay | Admitting: Family Medicine

## 2010-12-06 NOTE — Progress Notes (Signed)
Summary: Referral request/Nexium rx  Phone Note Call from Patient Call back at Work Phone [phone]   Summary of Call: Patient left message on triage that she would like to have referral to specialist about her cough and throat. Please advise. Lucious Groves CMA  November 29, 2010 10:38 AM   Follow-up for Phone Call        spoke w/ the GI doctor who agreed w/ the nexium but suggested that she increase it to two times a day.  told me to alert pt to fact that it may take 2-3 months for sxs to resolve.  did not feel she needed an endoscopy evaluation.  we can certainly refer her to pulmonary if she wants additional w/u. Follow-up by: Neena Rhymes MD,  November 29, 2010 10:43 AM  Additional Follow-up for Phone Call Additional follow up Details #1::        Patient notified and will try Nexium two times a day. She will call back later if above referral is needed. RX for Nexium sent per patient request.  Additional Follow-up by: Lucious Groves CMA,  November 29, 2010 11:22 AM    New/Updated Medications: NEXIUM 40 MG CPDR (ESOMEPRAZOLE MAGNESIUM) 1 by mouth two times a day Prescriptions: NEXIUM 40 MG CPDR (ESOMEPRAZOLE MAGNESIUM) 1 by mouth two times a day  #60 x 3   Entered by:   Lucious Groves CMA   Authorized by:   Neena Rhymes MD   Signed by:   Lucious Groves CMA on 11/29/2010   Method used:   [facility]ronically to        [facility] [phone]* (retail)       [address]       [place], [place]  [address]       Ph: [phone]       Fax: [phone]   RxID:   [id number]

## 2010-12-06 NOTE — Progress Notes (Signed)
Summary: prior auth---Nexium BID  Phone Note Refill Request Message from:  Fax from Pharmacy  Refills Requested: Medication #1:  NEXIUM 40 MG CPDR 1 by mouth two times a day. prior Berkley Harvey - [phone]  Initial call taken by: Okey Regal [place],  November 29, 2010 4:30 PM  Follow-up for Phone Call        BID therapy approved for 6 months. Follow-up by: Lucious Groves CMA,  November 30, 2010 9:10 AM

## 2010-12-06 NOTE — Assessment & Plan Note (Signed)
Summary: still coughing/cbs   Vital Signs:  Patient profile:   65 year old female Weight:      162 pounds BMI:     29.26 Pulse rate:   78 / minute BP sitting:   102 / 60  (left arm)  Vitals Entered By: Doristine Devoid CMA (November 27, 2010 1:08 PM) CC: still w/ cough    History of Present Illness: Cassandra Little here today w/ persistant cough.  using prilosec w/ some relief but cough remains.  using nasal spray regularly.  hasn't noticed any improvement since starting nasal spray.  did run out of prilosec temporarily- during which time the cough worsened.  cough is worse at night.  also worse when walking in the cold or wind.  sxs started in November after returning from [place].  denies night sweats, weight loss, hemoptysis.  Current Medications (verified): 1)  Alprazolam 0.5 Mg Tabs (Alprazolam) .... Take One Tablet Daily 2)  Minocycline Hcl 100 Mg Tabs (Minocycline Hcl) .... As Needed 3)  Fluoxetine Hcl 40 Mg Caps (Fluoxetine Hcl) .... Take One Tablet Daily 4)  Relpax 20 Mg Tabs (Eletriptan Hydrobromide) .... Take 1 Tablet At Onset May Repeat in 2 Hrs. After First Dose 5)  Premarin 0.625 Mg/gm Crea (Estrogens, Conjugated) .... Per Gyn 6)  Nasonex 50 Mcg/act Susp (Mometasone Furoate) .... 2 Sprays Each Nostril Once Daily  Allergies (verified): 1)  Ace Inhibitors  Review of Systems      See HPI  Physical Exam  General:  well-nourished,in no acute distress; alert,appropriate and cooperative throughout examination Nose:  External nasal examination shows no deformity or inflammation. Nasal mucosa are pink and moist without lesions or exudates. Mouth:  Oral mucosa and oropharynx without lesions or exudates.  Teeth in good repair. Neck:  No deformities, masses, or tenderness noted. Lungs:  Normal respiratory effort, chest expands symmetrically. Lungs are clear to auscultation, no crackles or wheezes.  occasional dry cough Heart:  reg S1/S2, no M/R/G   Impression &  Recommendations:  Problem # 1:  COUGH (ICD-786.2) Assessment New pt's persistant cough may be multifactorial- GERD, reactive airway dz.  stop OTC prilosec as this is not working for pt.  start samples of Nexium daily.  ProAir inhaler given for sxs that occur while outside- instructed on use.  pt to call if no improvement.  Problem # 2:  GERD (ICD-530.81) Assessment: Deteriorated switch to Nexium as OTC Prilosec is not controlling pt's sxs.  will contact GI and discuss when/if pt will need endoscopy.  Complete Medication List: 1)  Alprazolam 0.5 Mg Tabs (Alprazolam) .... Take one tablet daily 2)  Minocycline Hcl 100 Mg Tabs (Minocycline hcl) .... As needed 3)  Fluoxetine Hcl 40 Mg Caps (Fluoxetine hcl) .... Take one tablet daily 4)  Relpax 20 Mg Tabs (Eletriptan hydrobromide) .... Take 1 tablet at onset may repeat in 2 hrs. after first dose 5)  Premarin 0.625 Mg/gm Crea (Estrogens, conjugated) .... Per gyn 6)  Nasonex 50 Mcg/act Susp (Mometasone furoate) .... 2 sprays each nostril once daily  Patient Instructions: 1)  Call me in 2-3 weeks and let me know if your symptoms have improved 2)  STOP the Prilosec 3)  START the Nexium daily 4)  Use the inhaler as needed for coughing 5)  Call with any questions or concerns 6)  Hang in there!   Orders Added: 1)  Est. Patient Level III [04540]  Appended Document: still coughing/cbs    Clinical Lists Changes  Orders: Added new  Service order of [facility] Instruction [id number]) - Signed

## 2010-12-12 NOTE — Medication Information (Signed)
Summary: Nexium Approved  Nexium Approved   Imported By: Maryln Gottron 12/06/2010 13:15:26  _____________________________________________________________________  External Attachment:    Type:   Image     Comment:   External Document

## 2011-01-02 ENCOUNTER — Other Ambulatory Visit: Payer: Self-pay | Admitting: *Deleted

## 2011-01-03 MED ORDER — ESOMEPRAZOLE MAGNESIUM 40 MG PO CPDR: 40.0000 mg | DELAYED_RELEASE_CAPSULE | Freq: Two times a day (BID) | ORAL | Status: DC

## 2011-02-06 ENCOUNTER — Other Ambulatory Visit: Payer: Self-pay | Admitting: Obstetrics & Gynecology

## 2011-02-06 DIAGNOSIS — Z1231 Encounter for screening mammogram for malignant neoplasm of breast: Secondary | ICD-10-CM

## 2011-02-07 ENCOUNTER — Emergency Department (HOSPITAL_BASED_OUTPATIENT_CLINIC_OR_DEPARTMENT_OTHER)
Admission: EM | Admit: 2011-02-07 | Discharge: 2011-02-07 | Disposition: A | Discharge status: Home / Self Care | Payer: BC Managed Care – PPO | Attending: Emergency Medicine | Admitting: Emergency Medicine

## 2011-02-07 DIAGNOSIS — I10 Essential (primary) hypertension: Secondary | ICD-10-CM | POA: Insufficient documentation

## 2011-02-07 DIAGNOSIS — T782XXA Anaphylactic shock, unspecified, initial encounter: Secondary | ICD-10-CM | POA: Insufficient documentation

## 2011-02-07 DIAGNOSIS — R220 Localized swelling, mass and lump, head: Secondary | ICD-10-CM | POA: Insufficient documentation

## 2011-02-07 DIAGNOSIS — Z79899 Other long term (current) drug therapy: Secondary | ICD-10-CM | POA: Insufficient documentation

## 2011-02-07 DIAGNOSIS — R221 Localized swelling, mass and lump, neck: Secondary | ICD-10-CM | POA: Insufficient documentation

## 2011-02-08 ENCOUNTER — Ambulatory Visit (HOSPITAL_BASED_OUTPATIENT_CLINIC_OR_DEPARTMENT_OTHER): Payer: BC Managed Care – PPO

## 2011-02-13 ENCOUNTER — Encounter: Payer: Self-pay | Admitting: Family Medicine

## 2011-02-13 ENCOUNTER — Encounter: Payer: Self-pay | Admitting: *Deleted

## 2011-02-14 ENCOUNTER — Ambulatory Visit (INDEPENDENT_AMBULATORY_CARE_PROVIDER_SITE_OTHER): Payer: BC Managed Care – PPO | Admitting: Family Medicine

## 2011-02-14 ENCOUNTER — Encounter: Payer: Self-pay | Admitting: Family Medicine

## 2011-02-14 VITALS — BP 120/82 | Temp 98.7°F | Wt 163.1 lb

## 2011-02-14 DIAGNOSIS — F329 Major depressive disorder, single episode, unspecified: Secondary | ICD-10-CM

## 2011-02-14 DIAGNOSIS — T782XXA Anaphylactic shock, unspecified, initial encounter: Secondary | ICD-10-CM

## 2011-02-14 HISTORY — DX: Anaphylactic shock, unspecified, initial encounter: T78.2XXA

## 2011-02-14 NOTE — Assessment & Plan Note (Signed)
Pt w/ severe and life threatening reaction to unknown substance.  Pt now has epi-pen available.  Knows how to use pen and knows to call 911.  Will refer to allergist for testing to try and determine cause of rxn.

## 2011-02-14 NOTE — Assessment & Plan Note (Signed)
Pt's sxs have worsened w/ recent life threatening event.  Has a therapist that she sees prn- plans on scheduling appt.  Not interested in med changes.  Will follow closely.

## 2011-02-14 NOTE — Patient Instructions (Signed)
We will call you with your allergy appt Make sure you have your epi pen w/ you Always administer epi pen and then immediately call 9-1-1 Call with any questions or concerns Hang in there!!

## 2011-02-14 NOTE — Progress Notes (Signed)
  Subjective:    Patient ID: Cassandra Little, female    DOB: February 12, 1946, 65 y.o.   MRN: [id number]  HPI Allergic rxn- pt went to ER w/ anaphylactic rxn on 4/25.  Had a similar rxn 10 yrs ago and cause was never identified.  This episode was the worst- required multiple rounds of epi, steroids, benadryl.  D/c'd w/ epipen and prednisone.  Only thing new or different was the house was recently sprayed for ants and she may have been exposed when pulling weeds.  Pt was in ER for 4 hrs which pt reports is 'a long time' compared to previous episodes.  Pt is now taking Zyrtec daily and using Nasonex.  Fears this occuring again- overwhelmed by severity of response.  ER notes reviewed.   Review of Systems For ROS see HPI     Objective:   Physical Exam  Constitutional: She appears well-developed and well-nourished. She appears distressed.       Tearful and anxious throughout visit.  HENT:  Head: Normocephalic and atraumatic.  Nose: Nose normal.  Mouth/Throat: Oropharynx is clear and moist. No oropharyngeal exudate.  Neck: Normal range of motion. Neck supple. No tracheal deviation present. No thyromegaly present.  Cardiovascular: Normal rate, regular rhythm, normal heart sounds and intact distal pulses.   No murmur heard. Pulmonary/Chest: Effort normal and breath sounds normal. No stridor. No respiratory distress. She has no wheezes.  Lymphadenopathy:    She has no cervical adenopathy.  Psychiatric:       anxious          Assessment & Plan:

## 2011-03-02 NOTE — Op Note (Signed)
Cassandra Little, Cassandra Little                           ACCOUNT NO.:  [id number]   MEDICAL RECORD NO.:  [id number]                   PATIENT TYPE:  INP   LOCATION:  1826                                 FACILITY:  [facility]   PHYSICIAN:  Sandria Bales. Ezzard Standing, MD                 DATE OF BIRTH:  10/05/1946   DATE OF PROCEDURE:  07/23/2002  DATE OF DISCHARGE:                                 OPERATIVE REPORT   PREOPERATIVE DIAGNOSES:  Acute appendicitis.   POSTOPERATIVE DIAGNOSES:  Acute suppurative appendicitis.   OPERATION PERFORMED:  Laparoscopic appendectomy.   SURGEON:  Sandria Bales. Ezzard Standing, MD   ASSISTANT:  None.   ANESTHESIA:  General endotracheal.   ESTIMATED BLOOD LOSS:  Minimal.   INDICATIONS FOR PROCEDURE:  The patient is a 65 year old white female who  comes with the signs and symptoms of acute appendicitis and a CT scan  confirming this.  She now comes for attempted laparoscopic appendectomy.  The indications and potential complications were explained to the patient  and her husband including the possibility of open surgery.   DESCRIPTION OF PROCEDURE:  The patient was placed in supine position with a  Foley catheter in place.  She had her left arm tucked and right arm out.  She was given 3 gm of Unasyn at the beginning of the procedure.  Her abdomen  was prepped with Betadine solution and sterilely draped.  She has a Foley  catheter in place.  I made an infraumbilical incision.  The tissue was a  little gritty because she had a prior tummy tuck.  I was able to get in  the abdominal cavity without a lot of difficulty.  I placed a 12 mm Hasson  trocar, secured with a 0 Vicryl suture and passed a 0 degree 10 mm  laparoscope into the abdominal cavity.  The abdomen was explored.  The right  and left lobes of the liver were unremarkable.  The patient had a lot of fat  that overlay the bowel.  I was able to look down in the pelvis, found an  acutely inflamed appendix.  She also had bilateral  tubal ligation and no  evidence of abdominal hernia.  Two additional trocar's were placed, a 10 mm  Ethicon trocar on the left lower quadrant and a 5 mm Ethicon trocar in the  right upper quadrant.  The appendix was identified.  I took down the  mesentery of the appendix using the Harmonic scalpel back to the base of the  appendix.  I then used the vascular  30 mm GIA Endo GIA stapler.  I fired this across the base of the appendix.  I inspected the base.  There was good staple line with no bleeding or  leakage.  I placed the appendix in an endo catch bag and delivered it  through the umbilicus and sent it to pathology.   I then  irrigated out the abdomen with about 500 cc of saline.  There was no  bleeding and no other obvious bowel injury.  I then removed the trocars  under direct visualization.  There was no bleeding from any trocar site.  The  umbilical trocar was placed two 0 Vicryl sutures.  The skin at each site was  closed with a 5-0 Vicryl suture painted with tincture of benzoin, steri-  stripped and sterilely dressed.  The patient tolerated the procedure well  and was transported to the recovery room in good condition.  Sponge and  needle counts were correct at the end of this case.                                                 Sandria Bales. Ezzard Standing, MD    DHN/MEDQ  D:  07/22/2002  T:  07/23/2002  Job:  [id number]   cc:   Al Decant. [name], M.D.  [address]  [place]  [place] [address]  Fax: [phone]

## 2011-03-02 NOTE — H&P (Signed)
Cassandra Little, Cassandra Little                           ACCOUNT NO.:  [id number]   MEDICAL RECORD NO.:  [id number]                   PATIENT TYPE:  EMS   LOCATION:  MINO                                 FACILITY:  [facility]   PHYSICIAN:  Sandria Bales. Ezzard Standing, MD                 DATE OF BIRTH:  April 11, 1946   DATE OF ADMISSION:  07/22/2002  DATE OF DISCHARGE:                                HISTORY & PHYSICAL   HISTORY OF PRESENT ILLNESS:  This is a 65 year old white female who is a  patient of Al Decant. [name], M.D., who has had about a 24-hour history of  nausea and increasing abdominal pain.  Her pain began at 5 p.m. on the  afternoon of July 21, 2002.  She has been nauseated but has not vomited.  Her pain is increased and now localized to the right lower quadrant.  She  presented to the [facility] Emergency Room by ambulance. She has undergone a  CT scan which is consistent with acute appendicitis.   She denies any history of peptic ulcer disease, liver disease, pancreatic  disease, change in bowel habits.  She has had no history of Crohn's disease  or colitis.  Her prior abdominal surgeries include C-sections in 1982 and  1986 and then she had a tummy tuck by Earvin Hansen L. Shon Hough, M.D., in 1994.   ALLERGIES:  No known drug allergies.   MEDICATIONS:  She is on Pravachol.  She has just been on it for a month or  two and she is on Prozac for which she has been on for about 12 years for  depression.   REVIEW OF SYSTEMS:  NEUROLOGIC:  She has had a history of depression and has  been treated with Prozac by Dr. [name] for  about 12 years.  PULMONARY:  No  history of pneumonia or tuberculosis.  CARDIAC:  No history of heart  disease, chest pain or hypertension.  GASTROINTESTINAL:  See history of  present illness.  UROLOGIC:  No history of kidney stones or kidney  infections.  GYN:  Her last period was a year ago.  She has two children  both delivered by C-sections, who are 70 and 64 years of age.   She has had a  Pap smear by Dr. [name] and a mammogram just within the last month, which  report she has not heard yet.   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  She is accompanied by  her husband in the emergency room.   PHYSICAL EXAMINATION:  GENERAL APPEARANCE:  She is a well-nourished,  pleasant white female.  She has been given some morphine which makes her a  little sleepy.  VITAL SIGNS:  Temperature is 100.6, blood pressure 130/44, pulse 72,  respiratory rate 18.  HEENT:  Unremarkable.  NECK:  Supple without masses, thyromegaly or lymphadenopathy.  LUNGS:  Clear to auscultation.  CARDIOVASCULAR:  There is a regular rate and rhythm without murmur or rub.  BREASTS:  Examination not done.  ABDOMEN:  Mildly distended.  She is tender with guarding and rebound on her  right side, particularly her right lower quadrant.  She has a well-healed  transverse lower incision which goes along with her prior tummy tuck.  RECTAL:  Examination not done.  EXTREMITIES:  She has good strength in all four extremities.  NEUROLOGIC:  Grossly intact.   LABORATORY DATA:  Hemoglobin 14, hematocrit 43, white blood cell count  12,000, platelet count 222,000.  Sodium 136, potassium 3.8, chloride 102,  CO2 27.  Amylase 73.  Urine pregnancy was negative.  Urinalysis was normal.   I reviewed her CT scan which is consistent with acute appendicitis.   IMPRESSION:  1. Acute appendicitis.  I discussed with the patient and her husband that of     appendectomy, talked about the chance of doing a laparoscopic versus open     appendectomy and the risks including bleeding, infection, and recovery     from the surgery.  Will schedule her for appendectomy later this evening.  2. Depression, on Prozac, which is stable on this medicine for long term.                                               Sandria Bales. Ezzard Standing, MD    DHN/MEDQ  D:  07/22/2002  T:  07/22/2002  Job:  [id number]

## 2011-08-27 ENCOUNTER — Encounter: Payer: Self-pay | Admitting: Family Medicine

## 2011-08-27 ENCOUNTER — Ambulatory Visit (HOSPITAL_BASED_OUTPATIENT_CLINIC_OR_DEPARTMENT_OTHER)
Admission: RE | Admit: 2011-08-27 | Discharge: 2011-08-27 | Disposition: A | Discharge status: Home / Self Care | Payer: BC Managed Care – PPO | Source: Ambulatory Visit | Attending: Obstetrics & Gynecology | Admitting: Obstetrics & Gynecology

## 2011-08-27 ENCOUNTER — Ambulatory Visit (INDEPENDENT_AMBULATORY_CARE_PROVIDER_SITE_OTHER): Payer: BC Managed Care – PPO | Admitting: Family Medicine

## 2011-08-27 VITALS — BP 120/80 | HR 80 | Temp 97.8°F | Ht 61.0 in

## 2011-08-27 DIAGNOSIS — Z1231 Encounter for screening mammogram for malignant neoplasm of breast: Secondary | ICD-10-CM | POA: Insufficient documentation

## 2011-08-27 DIAGNOSIS — D485 Neoplasm of uncertain behavior of skin: Secondary | ICD-10-CM

## 2011-08-27 NOTE — Patient Instructions (Signed)
Schedule your complete physical for January- do not eat before this Someone will call you with your derm appt Do not stress about this! Happy Holidays!!!

## 2011-08-27 NOTE — Progress Notes (Signed)
  Subjective:    Patient ID: Cassandra Little, female    DOB: Aug 23, 1946, 65 y.o.   MRN: [id number]  HPI Spot on arm- has a derm appt in March.  L forearm.  Not painful.  First noticed ~1 yr ago- feels it is changing in color.  No drainage.  Size is unchanged.   Review of Systems For ROS see HPI     Objective:   Physical Exam  Constitutional: She appears well-developed and well-nourished. No distress.  Skin: Skin is warm and dry.       0.75 cm pink, slightly raised, scaling lesion on L forearm.          Assessment & Plan:

## 2011-08-28 ENCOUNTER — Telehealth: Payer: Self-pay | Admitting: Family Medicine

## 2011-08-28 ENCOUNTER — Other Ambulatory Visit: Payer: Self-pay | Admitting: Dermatology

## 2011-08-31 NOTE — Telephone Encounter (Signed)
error 

## 2011-09-02 NOTE — Assessment & Plan Note (Signed)
New area on L forearm is somewhat suspicious for squamous or basal cell.  Will attempt to get pt earlier appt w/ derm.

## 2011-09-17 ENCOUNTER — Ambulatory Visit (INDEPENDENT_AMBULATORY_CARE_PROVIDER_SITE_OTHER): Payer: BC Managed Care – PPO | Admitting: Family Medicine

## 2011-09-17 ENCOUNTER — Encounter: Payer: Self-pay | Admitting: Family Medicine

## 2011-09-17 DIAGNOSIS — Z0000 Encounter for general adult medical examination without abnormal findings: Secondary | ICD-10-CM | POA: Insufficient documentation

## 2011-09-17 DIAGNOSIS — K219 Gastro-esophageal reflux disease without esophagitis: Secondary | ICD-10-CM

## 2011-09-17 LAB — CBC WITH DIFFERENTIAL/PLATELET
Basophils Absolute: 0 10*3/uL (ref 0.0–0.1)
Eosinophils Absolute: 0.1 10*3/uL (ref 0.0–0.7)
Lymphocytes Relative: 37.6 % (ref 12.0–46.0)
Lymphs Abs: 2.2 10*3/uL (ref 0.7–4.0)
MCHC: 33.2 g/dL (ref 30.0–36.0)
Monocytes Relative: 8.4 % (ref 3.0–12.0)
Platelets: 231 10*3/uL (ref 150.0–400.0)
RDW: 14.5 % (ref 11.5–14.6)

## 2011-09-17 LAB — HEPATIC FUNCTION PANEL
Alkaline Phosphatase: 76 U/L (ref 39–117)
Bilirubin, Direct: 0 mg/dL (ref 0.0–0.3)
Total Bilirubin: 0.6 mg/dL (ref 0.3–1.2)
Total Protein: 7.1 g/dL (ref 6.0–8.3)

## 2011-09-17 LAB — LIPID PANEL
Cholesterol: 248 mg/dL — ABNORMAL HIGH (ref 0–200)
HDL: 61.6 mg/dL (ref >39.00)
Total CHOL/HDL Ratio: 4
Triglycerides: 76 mg/dL (ref 0.0–149.0)
VLDL: 15.2 mg/dL (ref 0.0–40.0)

## 2011-09-17 LAB — LDL CHOLESTEROL, DIRECT: Direct LDL: 165 mg/dL

## 2011-09-17 LAB — BASIC METABOLIC PANEL
CO2: 28 mEq/L (ref 19–32)
Calcium: 8.9 mg/dL (ref 8.4–10.5)
Chloride: 106 mEq/L (ref 96–112)
Creatinine, Ser: 0.9 mg/dL (ref 0.4–1.2)
Sodium: 142 mEq/L (ref 135–145)

## 2011-09-17 NOTE — Progress Notes (Signed)
  Subjective:    Patient ID: Cassandra Little, female    DOB: 10-May-1946, 65 y.o.   MRN: [id number]  HPI CPE- UTD on GYN, colonoscopy.  Has upcoming cosmetic surgery scheduled- needs EKG.  GERD- chronic problem, on Nexium BID due to severe sxs.  In last 2-3 weeks has noticed return of cough and throat irritation.  Has not had endoscopy that she remembers.  + dysphagia intermittently.   Review of Systems Patient reports no vision/ hearing changes, adenopathy,fever, weight change, chest pain, palpitations, edema, hemoptysis, dyspnea (rest/exertional/paroxysmal nocturnal), gastrointestinal bleeding (melena, rectal bleeding), abdominal pain, bowel changes, GU symptoms (dysuria, hematuria, incontinence), Gyn symptoms (abnormal  bleeding, pain),  syncope, focal weakness, memory loss, numbness & tingling, skin/hair/nail changes, abnormal bruising or bleeding, anxiety, or depression.     Objective:   Physical Exam General Appearance:    Alert, cooperative, no distress, appears stated age  Head:    Normocephalic, without obvious abnormality, atraumatic  Eyes:    PERRL, conjunctiva/corneas clear, EOM's intact, fundi    benign, both eyes  Ears:    Normal TM's and external ear canals, both ears  Nose:   Nares normal, septum midline, mucosa normal, no drainage    or sinus tenderness  Throat:   Lips, mucosa, and tongue normal; teeth and gums normal  Neck:   Supple, symmetrical, trachea midline, no adenopathy;    Thyroid: no enlargement/tenderness/nodules  Back:     Symmetric, no curvature, ROM normal, no CVA tenderness  Lungs:     Clear to auscultation bilaterally, respirations unlabored  Chest Wall:    No tenderness or deformity   Heart:    Regular rate and rhythm, S1 and S2 normal, no murmur, rub   or gallop  Breast Exam:    Deferred to GYN  Abdomen:     Soft, non-tender, bowel sounds active all four quadrants,    no masses, no organomegaly  Genitalia:    Deferred to GYN  Rectal:    Extremities:    Extremities normal, atraumatic, no cyanosis or edema  Pulses:   2+ and symmetric all extremities  Skin:   Skin color, texture, turgor normal, no rashes or lesions  Lymph nodes:   Cervical, supraclavicular, and axillary nodes normal  Neurologic:   CNII-XII intact, normal strength, sensation and reflexes    throughout          Assessment & Plan:

## 2011-09-17 NOTE — Patient Instructions (Signed)
We'll notify you of your lab results Someone will call you with your GI appt to address the reflux when you return You look great!  Keep up the good work! Call with any questions or concerns Good luck w/ surgery! Happy Holidays!

## 2011-09-18 ENCOUNTER — Telehealth: Payer: Self-pay | Admitting: *Deleted

## 2011-09-18 ENCOUNTER — Encounter: Payer: Self-pay | Admitting: *Deleted

## 2011-09-18 MED ORDER — ATORVASTATIN CALCIUM 10 MG PO TABS: 10.0000 mg | ORAL_TABLET | Freq: Every day | ORAL | Status: DC

## 2011-09-18 MED ORDER — TETRACYCLINE HCL 500 MG PO CAPS: 500.0000 mg | ORAL_CAPSULE | Freq: Four times a day (QID) | ORAL | Status: DC

## 2011-09-18 MED ORDER — BISMUTH 262 MG PO CHEW: CHEWABLE_TABLET | ORAL | Status: DC

## 2011-09-18 MED ORDER — METRONIDAZOLE 500 MG PO TABS: 500.0000 mg | ORAL_TABLET | Freq: Three times a day (TID) | ORAL | Status: AC

## 2011-09-18 NOTE — Telephone Encounter (Signed)
Spoke to pt to advise results/instructions. Pt understood. letter mailed to patients home address with results. Advised pt medication regimine to start lipitor AFTER she finishes new rx + H pylori (likely the cause of her severe reflux). Needs to start bismuth subsalicylate 525mg  TID plus metronidazole 500mg  TID plus tetracycline 500mg  TID all for 14 days- plus continue her Nexium  Once she has completed the course of meds she will need to start Lipitor 10mg  nightly Advised pt that she needs to contact surgeron to verify if it is ok with him/her to take these new medications before the surgery on Friday To note that the issue is a bacteria that causes stomach ulcers so this medication will help with the bacteria  changed the order of bismuth to 2 chew tabs TID per pt did not want sup liqaud due to travel Pt understood.

## 2011-09-19 ENCOUNTER — Other Ambulatory Visit: Payer: Self-pay | Admitting: *Deleted

## 2011-09-19 ENCOUNTER — Encounter: Payer: Self-pay | Admitting: *Deleted

## 2011-09-19 ENCOUNTER — Telehealth: Payer: Self-pay | Admitting: *Deleted

## 2011-09-19 LAB — VITAMIN D 1,25 DIHYDROXY: Vitamin D 1, 25 (OH)2 Total: 52 pg/mL (ref 18–72)

## 2011-09-19 MED ORDER — DOXYCYCLINE HYCLATE 100 MG PO TABS: 100.0000 mg | ORAL_TABLET | Freq: Two times a day (BID) | ORAL | Status: AC

## 2011-09-19 NOTE — Telephone Encounter (Signed)
Pt left vm to advise that she did not receive her 4th medication that I advised that she would have at [facility]. I advised that we received a fax from [facility] stating the medication is NA and that we sent another medication today for Doxycylcine 100mg  BID x 14days. Pt understood and will pick up at pharmacy. Pt asked if she needs to be eating differantly per the ulcer. I advised that the medication is to diminish the bacteria that she currently has and that the bacteria ITSELF can cause and Ulcer but that there is no notification stating she has an ulcer per was explained yesterday that she has a bacteria that can cause ulcer. Pt then asked why she has this bacteria? I advised that I would speak with MD when she comes back in office. Pt wanted to advise Tabori that she decided NOT to take all the medications till after her surgery in order to not complicate things. Will advise MD Beverely Low

## 2011-09-19 NOTE — Telephone Encounter (Signed)
Noted fax to change medication to Doxycycline from tetracycline. Sent new rx for 100mg  Doxycycline BID x 14days via escribe

## 2011-09-20 NOTE — Telephone Encounter (Signed)
Ok to take meds when she feels ready.  We cannot dx ulcer w/out upper GI scope- given lack of abdominal pain it is unlikely she currently has one.  tx of H pylori is to prevent ulcer formation.  There is no reason people 'get' this bacteria- it just happens.

## 2011-09-20 NOTE — Telephone Encounter (Signed)
Spoke to pt to advise results/instructions. Pt understood.  

## 2011-09-20 NOTE — Telephone Encounter (Signed)
.  left message to have patient return my call.  

## 2011-09-25 NOTE — Assessment & Plan Note (Signed)
Pt's PE WNL.  UTD on gyn.  Check labs.  Anticipatory guidance provided.  

## 2011-09-25 NOTE — Assessment & Plan Note (Signed)
Pt reports recent worsening of sxs.  Check H pylori.  Treat prn.  Refer to GI.  Pt expressed understanding and is in agreement w/ plan.

## 2011-09-27 ENCOUNTER — Telehealth: Payer: Self-pay | Admitting: Family Medicine

## 2011-09-27 NOTE — Telephone Encounter (Signed)
Patient started taking about 4 new meds last week - since taking them stool is very dark -

## 2011-09-27 NOTE — Telephone Encounter (Signed)
Last OV 09-17-11

## 2011-09-27 NOTE — Telephone Encounter (Signed)
Bismuth will make stool very dark.

## 2011-09-28 NOTE — Telephone Encounter (Signed)
.  left message to have patient return my call. On cell and home

## 2011-10-17 ENCOUNTER — Telehealth: Payer: Self-pay | Admitting: Internal Medicine

## 2011-10-17 NOTE — Telephone Encounter (Signed)
Patient is scheduled for an office visit not an procedure.  All questions answered

## 2011-10-18 ENCOUNTER — Ambulatory Visit (INDEPENDENT_AMBULATORY_CARE_PROVIDER_SITE_OTHER): Payer: BC Managed Care – PPO | Admitting: Internal Medicine

## 2011-10-18 ENCOUNTER — Encounter: Payer: Self-pay | Admitting: Internal Medicine

## 2011-10-18 VITALS — BP 122/86 | HR 80 | Ht 61.5 in | Wt 162.4 lb

## 2011-10-18 DIAGNOSIS — R490 Dysphonia: Secondary | ICD-10-CM

## 2011-10-18 DIAGNOSIS — R05 Cough: Secondary | ICD-10-CM

## 2011-10-18 MED ORDER — DEXLANSOPRAZOLE 60 MG PO CPDR: 60.0000 mg | DELAYED_RELEASE_CAPSULE | Freq: Every day | ORAL | Status: DC

## 2011-10-18 NOTE — Patient Instructions (Signed)
Your prescription(s) has(have) been sent to your pharmacy for you to pick up (Dexilant). Return to see Dr. Leone Payor in 2 months. Please top at the front desk and schedule that before leavimg. Please reduce your caffeine in take to 1-2 cups a day.

## 2011-10-18 NOTE — Progress Notes (Signed)
Subjective:    Patient ID: Cassandra Little, female    DOB: 03/25/1946, 66 y.o.   MRN: [id number]  HPI This 66 year old white woman is here for evaluation of cough and hoarseness thought possibly related to GERD. There is a history of a cough attributed to ACE inhibitors going back several years ago. She stopped the ACE inhibitor and the cough improved. Sometime in the last year or so she started having a recurrent dry cough. He will occur without clear trigger, frequently when she is talking he meetings. It starts gradually and worsens. It may improve with drinking some water. There some intermittent hoarseness as well. The cough is intermittent as well. For the last day she's been fine. There may be some sore throat at times. There was some question of dysphagia but when questioned more closely she is really more concerned about the possibility of swallowing difficulties and this really became evident after she had anaphylaxis episode in the spring and had swelling and edema problems in the throat area. Subsequent to that she had allergy testing and they could not turn up any particular cause or trigger for that anaphylaxis. I don't think is the first time she's had issues like that. She has not had problems since but she's admittedly fearful that she could have a recurrent problem. She does not have any clear esophageal or oral pharyngeal dysphagia.  She has been treated with Nexium, currently using 40 mg twice a day and she feels like she had benefit but then that started a slip away. H. pylori serology testing was done and was positive and she was treated with antibiotics for the H. pylori and these caused diarrhea, some dark stools and abdominal discomfort which is not gone but improving now that she's completed the treatment. She was given tetracycline and metronidazole with bismuth also.  He denies wheezing or dyspnea. There is no postnasal drip. He cannot identify any food triggers associated with her  cough, throat clearing and hoarseness.  Allergies  Allergen Reactions  . Ace Inhibitors     REACTION: cough   Outpatient Prescriptions Prior to Visit  Medication Sig Dispense Refill  . ALPRAZolam (XANAX) 0.5 MG tablet Take 0.5 mg by mouth daily.        Marland Kitchen buPROPion (WELLBUTRIN XL) 300 MG 24 hr tablet Take 300 mg by mouth daily.       Marland Kitchen eletriptan (RELPAX) 20 MG tablet One tablet by mouth as needed for migraine headache.  If the headache improves and then returns, dose may be repeated after 2 hours have elapsed since first dose (do not exceed 80 mg per day). may repeat in 2 hours if necessary       . FLUoxetine (PROZAC) 40 MG capsule Take 40 mg by mouth daily.       . minocycline (MINOCIN,DYNACIN) 100 MG capsule Take 100 mg by mouth as needed. Take 3 times per week as needed      . mometasone (NASONEX) 50 MCG/ACT nasal spray Place 2 sprays into the nose daily as needed.       Marland Kitchen PREMARIN vaginal cream Apply once per week vaginally      . Bismuth 262 MG CHEW Take 2 chew tablets/TID  90 each  0  . esomeprazole (NEXIUM) 40 MG capsule Take 1 capsule (40 mg total) by mouth 2 (two) times daily.  180 capsule  3  . atorvastatin (LIPITOR) 10 MG tablet Take 1 tablet (10 mg total) by mouth daily.  30 tablet  11  . cetirizine (ZYRTEC) 10 MG tablet Take 10 mg by mouth daily as needed.        Marland Kitchen estrogens, conjugated, (PREMARIN) 0.625 MG tablet Take 0.625 mg by mouth as directed. Take daily for 21 days then do not take for 7 days.      Marland Kitchen ketorolac (TORADOL) 10 MG tablet       . oxyCODONE-acetaminophen (PERCOCET) 7.5-325 MG per tablet       . tetracycline (ACHROMYCIN,SUMYCIN) 500 MG capsule Take 1 capsule (500 mg total) by mouth 4 (four) times daily.  45 capsule  0   Past Medical History  Diagnosis Date  . Hypertension   . Hyperlipidemia   . Migraine   . Depression     Dr. Raquel James  . Anaphylaxis   . Internal and external hemorrhoids without complication   . Anxiety   . Arthritis   . Sleep apnea      ??  Rosacea Past Surgical History  Procedure Date  . Appendectomy 2004  . Tonsillectomy   . Total shoulder replacement 2009    right  . Colonoscopy 09/15/2008    internal and external hemorroids  . Cosmetic surgery     multiple  . Cesarean section     x 2   History   Social History  . Marital Status: Divorced                 Social History Main Topics  . Smoking status: Never Smoker   . Smokeless tobacco: Never Used  . Alcohol Use: Yes     occasional wine  . Drug Use: No  .       Family History  Problem Relation Age of Onset  . Hyperlipidemia Mother   . Diabetes Father   . Hypertension Father   . Heart attack Father     86s  . Obesity Sister   . Kidney disease Sister   . Colon cancer Neg Hx         Review of Systems Recent cosmetic surgery, breast, tolerated well did not need pain medication. She has some ongoing intermittent stressed with work and family but denies any recent changes her flares and that. All other review of systems negative.    Objective:   Physical Exam General:  Well-developed, well-nourished and in no acute distress Eyes:  anicteric. ENT:   Mouth and posterior pharynx free of lesions mild erythema posterior, strong gag Neck:   supple w/o thyromegaly or mass.  Lungs: Clear to auscultation bilaterally. Heart:  S1S2, no rubs, murmurs, gallops. Abdomen:  soft, non-tender, no hepatosplenomegaly, hernia, or mass and BS+.   Lymph:  no cervical or supraclavicular adenopathy. Extremities:   no edema Skin   no rash. Neuro:  A&O x 3.  Psych:  Slightly flat affect   Data Reviewed: Previous office notes, colonoscopy report, recent lab testing         CHEST - 2 VIEW 09/18/2010 Comparison: None  Findings: Cardiomediastinal silhouette is within normal limits.  The lungs are free of focal consolidations and pleural effusions.  There is S-shaped scoliosis of the thoracolumbar spine.  Degenerative changes are seen in the spine. Patient has  had prior  right shoulder arthroplasty.  IMPRESSION:  No evidence for acute cardiopulmonary abnormality.       Assessment & Plan:   1. Cough   2. Hoarseness     These problems could be related to GERD. Reality of it is is that we do not know  and may never now. She has noted improvement with Nexium. She had cough previously attributed to ACE inhibitor, since it has recurred one has to question if that was so. I have explained to her that this is always difficult to sort out and that it is often multifactorial. She does not seem to have any significant nasal drainage or postnasal drip etc. She is on nasal steroid. She is concerned about things like esophageal cancer etc. I told her I think that is extremely unlikely and in fact I don't think there is any good formation likely to come from an endoscopy. We discussed the possibility of pH probe testing to see if she truly does have acid reflux, this could be done with nasal catheter or pH probe attachment to the esophagus. Endoscopy would be required for the latter. We discussed the possibility of empiric treatment with a different PPI and followup. She chose to try that and to reduce her caffeine intake as she drinks a large amount of iced tea throughout the day at least a few cups of coffee.  Other possible evaluation plans include referral to ENT for evaluation of the larynx, or referral to a pulmonary specialist. She does not seem to have dyspnea or wheezing I don't think she has asthma that remains possible.  She has been treated for H. pylori, there is a common misconcepction that this is a cause of GERD but that is not so. I explained that since she tested positive was appropriate to treat, but I do not think any followup testing was warranted.  The history of anaphylaxis is a curious feature here. The allergy testing she had done does not in the electronic medical record. She seems to be a reliable historian and presumably there is no  relationship.  She will try Dexilant 60 mg daily, she will reduce caffeine, she will return in 2 months for reassessment. Nexium has been discontinued.

## 2011-10-23 ENCOUNTER — Telehealth: Payer: Self-pay | Admitting: *Deleted

## 2011-10-23 NOTE — Telephone Encounter (Signed)
Morrie Sheldon from [facility] office called to ask if the pt has taking Amoxicillin lately per pt was not sure what medication she had been given, noted on 09-19-11 pt was given doxycylcine, tetracycline and flagyl no Amoxicillin for pt noted any time from this office, per pt protocol from [facility] office is for pt to take Amoxicillin before she has her procedure there. I advised per [facility] the Doxycycline is stronger than the Amoxicillin however the pt has not taken any ABT since 09-29-11 if she took the medication as directed, they can verify with pt if she received Amoxicillin from another source however she has not received any from [place] and [facility] will have to make the call to proceed with the procedure or not per MD/ their office protocol. Morrie Sheldon understood instructions

## 2011-10-23 NOTE — Telephone Encounter (Signed)
Agree.  If pt has been taking doxy, she is covered.  If she has not been on abx during the recommended window it will be up to surgeon to determine if the procedure needs to be postponed.

## 2011-10-29 ENCOUNTER — Telehealth: Payer: Self-pay | Admitting: Family Medicine

## 2011-10-29 MED ORDER — ATORVASTATIN CALCIUM 10 MG PO TABS: 10.0000 mg | ORAL_TABLET | Freq: Every day | ORAL | Status: DC

## 2011-10-29 NOTE — Telephone Encounter (Signed)
rx sent to pharmacy by e-script For #90 with 3 refills to [facility] on [address]

## 2011-10-29 NOTE — Telephone Encounter (Signed)
Patient states that she called [facility] to refill atorvastatin. Pharmacy told her to call us. She states that her drug plan requires 90 day supply instead of 30 day. Refill to be sent to [facility] on [address]

## 2011-11-06 ENCOUNTER — Other Ambulatory Visit: Payer: Self-pay | Admitting: Family Medicine

## 2011-11-06 MED ORDER — ATORVASTATIN CALCIUM 10 MG PO TABS: 10.0000 mg | ORAL_TABLET | Freq: Every day | ORAL | Status: DC

## 2011-11-06 NOTE — Telephone Encounter (Signed)
rx sent to pharmacy by e-script  

## 2011-11-08 ENCOUNTER — Telehealth: Payer: Self-pay | Admitting: Gastroenterology

## 2011-11-08 NOTE — Telephone Encounter (Signed)
Received a denial letter. Talked the pharmacist and they want the patient to try a lower dose of Dexilant first.

## 2011-11-08 NOTE — Telephone Encounter (Signed)
Called [employer] to get Dexilant approved. Talked to someone named Medlisa, couldn't understand a lot of thing she was saying because English was not clear. She stated that she would give her manager for review. Waiting on approval/denial letter.

## 2011-11-09 ENCOUNTER — Other Ambulatory Visit: Payer: Self-pay | Admitting: Gastroenterology

## 2011-11-09 ENCOUNTER — Encounter: Payer: Self-pay | Admitting: Family Medicine

## 2011-11-09 ENCOUNTER — Ambulatory Visit (INDEPENDENT_AMBULATORY_CARE_PROVIDER_SITE_OTHER): Payer: BC Managed Care – PPO | Admitting: Family Medicine

## 2011-11-09 VITALS — BP 120/75 | HR 76 | Temp 98.3°F | Ht 61.75 in | Wt 161.2 lb

## 2011-11-09 DIAGNOSIS — T8131XA Disruption of external operation (surgical) wound, not elsewhere classified, initial encounter: Secondary | ICD-10-CM | POA: Insufficient documentation

## 2011-11-09 DIAGNOSIS — Y849 Medical procedure, unspecified as the cause of abnormal reaction of the patient, or of later complication, without mention of misadventure at the time of the procedure: Secondary | ICD-10-CM

## 2011-11-09 DIAGNOSIS — K649 Unspecified hemorrhoids: Secondary | ICD-10-CM

## 2011-11-09 DIAGNOSIS — T8130XA Disruption of wound, unspecified, initial encounter: Secondary | ICD-10-CM | POA: Insufficient documentation

## 2011-11-09 MED ORDER — DEXLANSOPRAZOLE 30 MG PO CPDR: 30.0000 mg | DELAYED_RELEASE_CAPSULE | Freq: Every day | ORAL | Status: DC

## 2011-11-09 NOTE — Progress Notes (Signed)
  Subjective:    Patient ID: Cassandra Little, female    DOB: January 26, 1946, 66 y.o.   MRN: [id number]  HPI Stitch removal- pt had recent breast surgery.  Has 1 stitch poking through skin under R breast.  No drainage or pain.  Hemorrhoids- pt would like referral for tx   Review of Systems For ROS see HPI     Objective:   Physical Exam  Vitals reviewed. Constitutional: She appears well-developed and well-nourished. No distress.  Skin: Skin is warm and dry.       1 retained stitch under R breast poking through skin- appears to be internal suture.  No redness, induration, warmth or drainage.          Assessment & Plan:

## 2011-11-09 NOTE — Telephone Encounter (Signed)
Patient's insurance denied approval for Dexilant 60 mg stating patient should needs to try lower dose of medication first. Okayed by Dr. Leone Payor. Patient informed and medication sent in.

## 2011-11-12 ENCOUNTER — Telehealth: Payer: Self-pay | Admitting: *Deleted

## 2011-11-12 NOTE — Telephone Encounter (Signed)
Pt states that at last OV that it was discuss about getting her hemorrhoid taking care of. Pt would like for Dr Beverely Low to get her a appt with Dr Claud Kelp since Dr Beverely Low advise he was a good Careers adviser.

## 2011-11-12 NOTE — Telephone Encounter (Signed)
Referral put in by Dr Beverely Low. Pt aware

## 2011-11-12 NOTE — Telephone Encounter (Signed)
Please refer to general surgery- Dr Claud Kelp- dx hemorrhoids

## 2011-11-18 NOTE — Assessment & Plan Note (Signed)
Pt w/ retained internal suture.  No evidence of infxn.  Will not remove stitch for fear of disrupting internal adjustments.  Pt expressed understanding and is in agreement w/ plan.

## 2011-11-18 NOTE — Assessment & Plan Note (Signed)
Pt requesting referral- discussed GI vs surgery.  Pt to think about it and call when she decides.

## 2011-11-28 ENCOUNTER — Ambulatory Visit (INDEPENDENT_AMBULATORY_CARE_PROVIDER_SITE_OTHER): Payer: BC Managed Care – PPO | Admitting: General Surgery

## 2011-11-28 ENCOUNTER — Encounter (INDEPENDENT_AMBULATORY_CARE_PROVIDER_SITE_OTHER): Payer: Self-pay | Admitting: General Surgery

## 2011-11-28 VITALS — BP 122/80 | HR 68 | Temp 97.9°F | Resp 18 | Ht 61.5 in | Wt 160.4 lb

## 2011-11-28 DIAGNOSIS — K649 Unspecified hemorrhoids: Secondary | ICD-10-CM

## 2011-11-28 NOTE — Patient Instructions (Signed)
Your symptoms of itching and burning and difficulty with cleansing the skin are related to your small external hemorrhoids. You do have internal hemorrhoids but I do not perceive that you had any symptoms from this.  In the short term, I recommend that you use baby wipes instead of dry toilet paper. I recommended to stay very well hydrated and take fiber supplementation daily. You have been given a prescription for Analpram-HC 2.5% cream to put a thin film on the hemorrhoids twice a day.  Return to see me if your symptoms are not improved after one month.

## 2011-11-28 NOTE — Progress Notes (Signed)
Patient ID: Cassandra Little, female   DOB: [birth year], 66 y.o.   MRN: [id number]  Chief Complaint  Patient presents with  . Hemorrhoids    HPI Cassandra Little is a 66 y.o. female.  She is referred to me by Dr. Natalia Leatherwood Taborifor evaluation of hemorrhoids. Dr. Leone Payor is her gastroenterologist.  The patient states that she has had symptoms from her hemorrhoids for over 20 years. Her symptoms are primarily itching and burning and difficulty to keep them clean. She states that she had one thrombosis in the past. I question her very carefully and I could not get a history of any significant bleeding, prolapse, or painful bowel movements. She says she takes fiber daily and her bowels are fairly normal. She had a colonoscopy 3 years ago. No gastrointestinal diseases. HPI  Past Medical History  Diagnosis Date  . Hypertension   . Hyperlipidemia   . Migraine   . Depression     Dr. Raquel James  . Anaphylaxis   . Internal and external hemorrhoids without complication   . Anxiety   . Arthritis   . Sleep apnea     ??  . Rosacea   . Cough   . Rectal bleeding     Past Surgical History  Procedure Date  . Tonsillectomy 1962  . Total shoulder replacement 2009    right  . Colonoscopy 09/15/2008    internal and external hemorroids  . Cosmetic surgery     multiple  . Appendectomy 2004  . Cesarean section 1982, 1985    x 2    Family History  Problem Relation Age of Onset  . Hyperlipidemia Mother   . Diabetes Father   . Hypertension Father   . Heart attack Father     76s  . Obesity Sister   . Kidney disease Sister   . Colon cancer Neg Hx     Social History History  Substance Use Topics  . Smoking status: Never Smoker   . Smokeless tobacco: Never Used  . Alcohol Use: Yes     occasional wine    Allergies  Allergen Reactions  . Ace Inhibitors     REACTION: cough    Current Outpatient Prescriptions  Medication Sig Dispense Refill  . NEXIUM 40 MG capsule Daily.      Marland Kitchen ALPRAZolam  (XANAX) 0.5 MG tablet Take 0.5 mg by mouth daily.        Marland Kitchen atorvastatin (LIPITOR) 10 MG tablet Take 1 tablet (10 mg total) by mouth daily.  90 tablet  3  . buPROPion (WELLBUTRIN XL) 300 MG 24 hr tablet Take 300 mg by mouth daily.       . cetirizine (ZYRTEC) 10 MG tablet Take 10 mg by mouth daily as needed.        . eletriptan (RELPAX) 20 MG tablet One tablet by mouth as needed for migraine headache.  If the headache improves and then returns, dose may be repeated after 2 hours have elapsed since first dose (do not exceed 80 mg per day). may repeat in 2 hours if necessary       . estrogens, conjugated, (PREMARIN) 0.625 MG tablet Take 0.625 mg by mouth as directed. Take daily for 21 days then do not take for 7 days.      Marland Kitchen FLUoxetine (PROZAC) 40 MG capsule Take 40 mg by mouth daily.       . minocycline (MINOCIN,DYNACIN) 100 MG capsule Take 100 mg by mouth as needed. Take 3 times per week as  needed      . mometasone (NASONEX) 50 MCG/ACT nasal spray Place 2 sprays into the nose daily as needed.       Marland Kitchen PREMARIN vaginal cream Apply once per week vaginally        Review of Systems Review of Systems  Constitutional: Negative for fever, chills and unexpected weight change.  HENT: Negative for hearing loss, congestion, sore throat, trouble swallowing and voice change.   Eyes: Negative for visual disturbance.  Respiratory: Negative for cough and wheezing.   Cardiovascular: Negative for chest pain, palpitations and leg swelling.  Gastrointestinal: Positive for rectal pain. Negative for nausea, vomiting, abdominal pain, diarrhea, constipation, blood in stool, abdominal distention and anal bleeding.  Genitourinary: Negative for hematuria, vaginal bleeding and difficulty urinating.  Musculoskeletal: Negative for arthralgias.  Skin: Negative for rash and wound.  Neurological: Negative for seizures, syncope and headaches.  Hematological: Negative for adenopathy. Does not bruise/bleed easily.    Psychiatric/Behavioral: Negative for confusion.    Blood pressure 122/80, pulse 68, temperature 97.9 F (36.6 C), temperature source Temporal, resp. rate 18, height 5' 1.5" (1.562 m), weight 160 lb 6.4 oz (72.757 kg).  Physical Exam Physical Exam  Constitutional: She is oriented to person, place, and time. She appears well-developed and well-nourished. No distress.  HENT:  Head: Normocephalic and atraumatic.  Nose: Nose normal.  Mouth/Throat: No oropharyngeal exudate.  Eyes: Conjunctivae and EOM are normal. Pupils are equal, round, and reactive to light. Left eye exhibits no discharge. No scleral icterus.  Neck: Neck supple. No JVD present. No tracheal deviation present. No thyromegaly present.  Cardiovascular: Normal rate, regular rhythm, normal heart sounds and intact distal pulses.   No murmur heard. Pulmonary/Chest: Effort normal and breath sounds normal. No respiratory distress. She has no wheezes. She has no rales. She exhibits no tenderness.  Abdominal: Soft. Bowel sounds are normal. She exhibits no distension and no mass. There is no tenderness. There is no rebound and no guarding.  Genitourinary:       Small, circumferential external hemorrhoids. Uncomplicated. No thrombosis. Skin healthy. No dermatitis. No fissure. No fistula. Digital exam normal. Anoscopy reveals small internal hemorrhoids, stage I  Musculoskeletal: She exhibits no edema and no tenderness.  Lymphadenopathy:    She has no cervical adenopathy.  Neurological: She is alert and oriented to person, place, and time. She exhibits normal muscle tone. Coordination normal.  Skin: Skin is warm. No rash noted. She is not diaphoretic. No erythema. No pallor.  Psychiatric: She has a normal mood and affect. Her behavior is normal. Judgment and thought content normal.    Data Reviewed I reviewed an office note from Dr. Beverely Low Assessment    External hemorrhoids, mildly symptomatic with itching burning and historically,  trouble cleansing.  No evidence of dermatitis.  Stage I internal hemorrhoids, asymptomatic by history  Anxiety disorder  Hypertension   Plan    There is no indication for surgical intervention at this point.  Analpram-HC 2.5% cream b.i.d. for 10 days.   Hopefully this will resolve her symptoms of itching and burning.  Continue high-fiber diet, stress  hydration  No indication for intervention on internal hemorrhoids.... they are asymptomatic.  If she continues to be symptomatic, I advised her to return for repeat exam. We can always do more invasive procedures if symptoms progress. Otherwise see me p.r.n.       Guthrie Lemme M 11/28/2011, 3:27 PM

## 2011-12-04 ENCOUNTER — Ambulatory Visit: Payer: BC Managed Care – PPO | Attending: Obstetrics & Gynecology | Admitting: Physical Therapy

## 2011-12-04 DIAGNOSIS — IMO0001 Reserved for inherently not codable concepts without codable children: Secondary | ICD-10-CM | POA: Insufficient documentation

## 2011-12-04 DIAGNOSIS — M2420 Disorder of ligament, unspecified site: Secondary | ICD-10-CM | POA: Insufficient documentation

## 2011-12-04 DIAGNOSIS — M629 Disorder of muscle, unspecified: Secondary | ICD-10-CM | POA: Insufficient documentation

## 2011-12-10 ENCOUNTER — Ambulatory Visit: Payer: BC Managed Care – PPO | Admitting: Physical Therapy

## 2011-12-19 ENCOUNTER — Ambulatory Visit: Payer: BC Managed Care – PPO | Admitting: Internal Medicine

## 2011-12-20 ENCOUNTER — Encounter: Payer: BC Managed Care – PPO | Admitting: Physical Therapy

## 2012-01-01 ENCOUNTER — Other Ambulatory Visit: Payer: Self-pay

## 2012-01-18 ENCOUNTER — Telehealth: Payer: Self-pay | Admitting: Family Medicine

## 2012-01-18 MED ORDER — MOMETASONE FUROATE 50 MCG/ACT NA SUSP: 2.0000 | Freq: Every day | NASAL | Status: DC | PRN

## 2012-01-18 MED ORDER — EPINEPHRINE 0.3 MG/0.3ML IJ DEVI: 0.3000 mg | Freq: Once | INTRAMUSCULAR | Status: DC

## 2012-01-18 NOTE — Telephone Encounter (Signed)
rx sent to pharmacy by e-script  

## 2012-01-18 NOTE — Telephone Encounter (Signed)
Refill: Epipen 0.3 mg auto-inj 2 pak qty 2. Give as needed for allergic reaction. Last fill 02-07-11  Nasonex 50 mcg nasal spray. Qty 3. Use 2 sprays in each nostril daily. Last fill 11-11-10

## 2012-04-23 DIAGNOSIS — G43909 Migraine, unspecified, not intractable, without status migrainosus: Secondary | ICD-10-CM | POA: Insufficient documentation

## 2012-07-21 ENCOUNTER — Telehealth: Payer: Self-pay | Admitting: Family Medicine

## 2012-07-21 MED ORDER — ESOMEPRAZOLE MAGNESIUM 40 MG PO CPDR: 40.0000 mg | DELAYED_RELEASE_CAPSULE | Freq: Every day | ORAL | Status: DC

## 2012-07-21 NOTE — Telephone Encounter (Signed)
rx sent to pharmacy by e-script for #30 with 1 refill Letter has been mailed to pt address noted in the chart to advise they are overdue for cpe/ov/labs and the pt needs to contact office to set up appt  Pt needs follow up OV with fasting labs

## 2012-07-21 NOTE — Telephone Encounter (Signed)
Refill: Nexium dr 40 mg capsule. Take one capsule twice a day. Qty 180. Last fill 2.13.13

## 2012-08-04 ENCOUNTER — Other Ambulatory Visit: Payer: Self-pay | Admitting: Family Medicine

## 2012-08-04 DIAGNOSIS — Z1231 Encounter for screening mammogram for malignant neoplasm of breast: Secondary | ICD-10-CM

## 2012-08-27 ENCOUNTER — Ambulatory Visit (HOSPITAL_BASED_OUTPATIENT_CLINIC_OR_DEPARTMENT_OTHER)
Admission: RE | Admit: 2012-08-27 | Discharge: 2012-08-27 | Disposition: A | Discharge status: Home / Self Care | Payer: BC Managed Care – PPO | Source: Ambulatory Visit | Attending: Family Medicine | Admitting: Family Medicine

## 2012-08-27 DIAGNOSIS — Z1231 Encounter for screening mammogram for malignant neoplasm of breast: Secondary | ICD-10-CM

## 2012-10-15 HISTORY — PX: HEMORRHOID BANDING: SHX5850

## 2012-11-09 ENCOUNTER — Emergency Department (HOSPITAL_BASED_OUTPATIENT_CLINIC_OR_DEPARTMENT_OTHER)
Admission: EM | Admit: 2012-11-09 | Discharge: 2012-11-09 | Disposition: A | Discharge status: Home / Self Care | Payer: BC Managed Care – PPO | Attending: Emergency Medicine | Admitting: Emergency Medicine

## 2012-11-09 ENCOUNTER — Encounter (HOSPITAL_BASED_OUTPATIENT_CLINIC_OR_DEPARTMENT_OTHER): Payer: Self-pay | Admitting: Emergency Medicine

## 2012-11-09 DIAGNOSIS — H43399 Other vitreous opacities, unspecified eye: Secondary | ICD-10-CM | POA: Insufficient documentation

## 2012-11-09 DIAGNOSIS — F3289 Other specified depressive episodes: Secondary | ICD-10-CM | POA: Insufficient documentation

## 2012-11-09 DIAGNOSIS — E785 Hyperlipidemia, unspecified: Secondary | ICD-10-CM | POA: Insufficient documentation

## 2012-11-09 DIAGNOSIS — H43392 Other vitreous opacities, left eye: Secondary | ICD-10-CM

## 2012-11-09 DIAGNOSIS — I10 Essential (primary) hypertension: Secondary | ICD-10-CM | POA: Insufficient documentation

## 2012-11-09 DIAGNOSIS — K625 Hemorrhage of anus and rectum: Secondary | ICD-10-CM | POA: Insufficient documentation

## 2012-11-09 DIAGNOSIS — F329 Major depressive disorder, single episode, unspecified: Secondary | ICD-10-CM | POA: Insufficient documentation

## 2012-11-09 DIAGNOSIS — F411 Generalized anxiety disorder: Secondary | ICD-10-CM | POA: Insufficient documentation

## 2012-11-09 DIAGNOSIS — Z79899 Other long term (current) drug therapy: Secondary | ICD-10-CM | POA: Insufficient documentation

## 2012-11-09 DIAGNOSIS — L719 Rosacea, unspecified: Secondary | ICD-10-CM | POA: Insufficient documentation

## 2012-11-09 DIAGNOSIS — M129 Arthropathy, unspecified: Secondary | ICD-10-CM | POA: Insufficient documentation

## 2012-11-09 MED ORDER — TROPICAMIDE 1 % OP SOLN
1.0000 [drp] | Freq: Once | OPHTHALMIC | Status: AC
  Administered 2012-11-09: 1 [drp] via OPHTHALMIC
  Filled 2012-11-09: qty 2

## 2012-11-09 MED ORDER — TETRACAINE HCL 0.5 % OP SOLN
2.0000 [drp] | Freq: Once | OPHTHALMIC | Status: AC
  Administered 2012-11-09: 2 [drp] via OPHTHALMIC
  Filled 2012-11-09: qty 2

## 2012-11-09 NOTE — ED Notes (Signed)
Pt having increased floaters and lights flashing to left eye since Friday.  Pt does have history of migraines but the symptoms are not the same.  Pt was concerned about retinal detachment or stroke.  No symptoms of weakness, headache, drooping or numbness.  Pt describes lights as circle to left peripheral area.  Denies any darkness.

## 2012-11-09 NOTE — ED Notes (Signed)
Slit lamp at bedside.  

## 2012-11-09 NOTE — ED Provider Notes (Signed)
History     CSN: [id number]  Arrival date & time 11/09/12  4098   First MD Initiated Contact with Patient 11/09/12 [phone]      Chief Complaint  Patient presents with  . Eye Problem    (Consider location/radiation/quality/duration/timing/severity/associated sxs/prior treatment) HPI Patient with colored circular light flashes to left upper visual field since Friday pm occurs with blinking. No loss of vision or change in vision or visual field.  Patient has had some increased floaters since that time but denies any "showers".  Patient with glasses on and states baseline astygmatism and farsighted but states glasses since grade one.  Eye exam [place]  Patient denies headache but history of migraines but has had rare occasions of scotoma but has had visual loss in both eyes with those.  No pain in eye.  Did take migraine pill Friday night.  Past Medical History  Diagnosis Date  . Hypertension   . Hyperlipidemia   . Migraine   . Depression     Dr. Raquel James  . Anaphylaxis   . Internal and external hemorrhoids without complication   . Anxiety   . Arthritis   . Sleep apnea     ??  . Rosacea   . Cough   . Rectal bleeding     Past Surgical History  Procedure Date  . Tonsillectomy 1962  . Total shoulder replacement 2009    right  . Colonoscopy 09/15/2008    internal and external hemorroids  . Cosmetic surgery     multiple  . Appendectomy 2004  . Cesarean section 1982, 1985    x 2    Family History  Problem Relation Age of Onset  . Hyperlipidemia Mother   . Diabetes Father   . Hypertension Father   . Heart attack Father     56s  . Obesity Sister   . Kidney disease Sister   . Colon cancer Neg Hx     History  Substance Use Topics  . Smoking status: Never Smoker   . Smokeless tobacco: Never Used  . Alcohol Use: Yes     Comment: occasional wine    OB History    Grav Para Term Preterm Abortions TAB SAB Ect Mult Living   2 2              Review of  Systems  Allergies  Ace inhibitors  Home Medications   Current Outpatient Rx  Name  Route  Sig  Dispense  Refill  . ALPRAZOLAM 0.5 MG PO TABS   Oral   Take 0.5 mg by mouth daily.           . BUPROPION HCL ER (XL) 300 MG PO TB24   Oral   Take 300 mg by mouth daily.          Marland Kitchen CETIRIZINE HCL 10 MG PO TABS   Oral   Take 10 mg by mouth daily as needed.           Marland Kitchen ELETRIPTAN HYDROBROMIDE 20 MG PO TABS   Oral   One tablet by mouth as needed for migraine headache.  If the headache improves and then returns, dose may be repeated after 2 hours have elapsed since first dose (do not exceed 80 mg per day). may repeat in 2 hours if necessary          . ESTROGENS CONJUGATED 0.625 MG PO TABS   Oral   Take 0.625 mg by mouth as directed. Take daily for 21  days then do not take for 7 days.         Marland Kitchen FLUOXETINE HCL 40 MG PO CAPS   Oral   Take 40 mg by mouth daily.          Marland Kitchen PREMARIN 0.625 MG/GM VA CREA      Apply once per week vaginally         . ATORVASTATIN CALCIUM 10 MG PO TABS   Oral   Take 1 tablet (10 mg total) by mouth daily.   90 tablet   3   . EPINEPHRINE 0.3 MG/0.3ML IJ DEVI   Intramuscular   Inject 0.3 mLs (0.3 mg total) into the muscle once.   2 Device   1     BP 139/86  Pulse 76  Temp 98 F (36.7 C) (Oral)  Resp 20  SpO2 99%  Physical Exam  Nursing note and vitals reviewed. Constitutional: She appears well-developed and well-nourished.  HENT:  Head: Normocephalic and atraumatic.  Right Ear: External ear normal.  Left Ear: External ear normal.  Nose: Nose normal.  Mouth/Throat: Oropharynx is clear and moist.  Eyes: Conjunctivae normal and EOM are normal. Pupils are equal, round, and reactive to light. Right conjunctiva is not injected. Right conjunctiva has no hemorrhage. Left conjunctiva is not injected. Left conjunctiva has no hemorrhage.  Fundoscopic exam:      The right eye shows no arteriolar narrowing, no AV nicking, no exudate, no  hemorrhage and no papilledema. The right eye shows no venous pulsations.      The left eye shows no arteriolar narrowing, no AV nicking, no exudate, no hemorrhage and no papilledema. The left eye shows no venous pulsations. Slit lamp exam:      The right eye shows no corneal abrasion, no corneal flare, no corneal ulcer, no hyphema and no hypopyon.       The left eye shows no corneal abrasion, no corneal flare, no corneal ulcer, no hyphema and no hypopyon.       Vision 20/25 od and 20/30 os, no visual field deficits noted .  Eyes dilated and fundus viewed with no obvious retinal detachment or vitreous hemorrhage. No vitreous pigment noted on slit lamp exam.      ED Course  Procedures (including critical care time)  Labs Reviewed - No data to display No results found.   No diagnosis found.    MDM  Patient states flashes occur with blinking less frequently now only q 15 minutes.  No s/s retinal detachment or vitreous hemmorhage on pe.     Discussed with Dr. Noel Gerold and she will see patient in office at 0800 tomorrow morning.  Patient voices understanding of plan.  She is cautiioned regarding activity, increasing iop, and signs and symptoms of retinal detachment to return immediately if any visual loss.       Hilario Quarry, MD 11/09/12 [phone]

## 2012-11-09 NOTE — ED Notes (Signed)
Tropicamide not in pyxis.  [facility] and ordered.  Courier to pick up and deliver.

## 2013-01-06 ENCOUNTER — Telehealth: Payer: Self-pay | Admitting: Obstetrics & Gynecology

## 2013-01-06 NOTE — Telephone Encounter (Signed)
PT SAYS RX WILL BE COVERED BY HER INSURANCE PLEASE CALL TO [facility] FA[facility] [phone]  I.D#  [id number]

## 2013-01-08 ENCOUNTER — Telehealth: Payer: Self-pay | Admitting: *Deleted

## 2013-01-08 MED ORDER — ESTROGENS, CONJUGATED 0.625 MG/GM VA CREA: TOPICAL_CREAM | Freq: Every day | VAGINAL | Status: DC

## 2013-01-08 NOTE — Telephone Encounter (Signed)
Pt says Premarin will be covered by insurance rx X 1 year sent to [facility]

## 2013-01-08 NOTE — Telephone Encounter (Signed)
Premarin #42.5 grm/ 1 refill sent to [facility]; pt is aware

## 2013-01-19 ENCOUNTER — Other Ambulatory Visit: Payer: Self-pay | Admitting: Obstetrics & Gynecology

## 2013-01-19 ENCOUNTER — Other Ambulatory Visit (INDEPENDENT_AMBULATORY_CARE_PROVIDER_SITE_OTHER): Payer: BC Managed Care – PPO

## 2013-01-19 DIAGNOSIS — Z0000 Encounter for general adult medical examination without abnormal findings: Secondary | ICD-10-CM

## 2013-01-19 LAB — LIPID PANEL
LDL Cholesterol: 191 mg/dL — ABNORMAL HIGH (ref 0–99)
Triglycerides: 118 mg/dL (ref <150)
VLDL: 24 mg/dL (ref 0–40)

## 2013-01-19 LAB — COMPREHENSIVE METABOLIC PANEL
ALT: 21 U/L (ref 0–35)
AST: 19 U/L (ref 0–37)
CO2: 29 mEq/L (ref 19–32)
Calcium: 9.4 mg/dL (ref 8.4–10.5)
Chloride: 105 mEq/L (ref 96–112)
Potassium: 4.7 mEq/L (ref 3.5–5.3)
Sodium: 140 mEq/L (ref 135–145)
Total Protein: 6.4 g/dL (ref 6.0–8.3)

## 2013-01-19 LAB — TSH: TSH: 2.986 u[IU]/mL (ref 0.350–4.500)

## 2013-01-21 NOTE — Progress Notes (Signed)
Her chart is in your box on desk.

## 2013-01-22 ENCOUNTER — Other Ambulatory Visit: Payer: Self-pay | Admitting: Family Medicine

## 2013-01-22 NOTE — Telephone Encounter (Signed)
Pt left VM that she just had her cholesterol checked and it was elevated.  Pt notes that result were to be faxed to Dr Beverely Low. Pt would like to discuss treatment options other then statin and determine which area/part of cholesterol  Is elevated. Pt indicated that she is scared to take a statin. Pt last OV 11-09-11,  Called Pt back advise Pt that OV will be needed in order to discuss specific concerns about med and cholesterol risk factor in detail. Pt schedule to come in for OV on tomorrow.

## 2013-01-23 ENCOUNTER — Encounter: Payer: Self-pay | Admitting: Family Medicine

## 2013-01-23 ENCOUNTER — Ambulatory Visit (INDEPENDENT_AMBULATORY_CARE_PROVIDER_SITE_OTHER): Payer: BC Managed Care – PPO | Admitting: Family Medicine

## 2013-01-23 VITALS — BP 120/82 | HR 87 | Temp 98.6°F | Wt 164.0 lb

## 2013-01-23 DIAGNOSIS — E785 Hyperlipidemia, unspecified: Secondary | ICD-10-CM

## 2013-01-23 LAB — HEMOGLOBIN, FINGERSTICK: Hemoglobin, fingerstick: 13.6 g/dL (ref 12.0–16.0)

## 2013-01-23 MED ORDER — EZETIMIBE 10 MG PO TABS: 10.0000 mg | ORAL_TABLET | Freq: Every day | ORAL | Status: DC

## 2013-01-23 NOTE — Patient Instructions (Addendum)
Follow up in 3 months to recheck cholesterol Keep up the good work on healthy diet and regular exercise Call with any questions or concerns Have a great weekend!!!

## 2013-01-23 NOTE — Assessment & Plan Note (Signed)
Chronic problem.  Pt's levels continue to rise.  Pt is very resistant to idea of statins.  Will start Zetia and see how much reduction pt is able to get.  Will plan on NMR profile in 3 months.  Reviewed supportive care and red flags that should prompt return.  Pt expressed understanding and is in agreement w/ plan.

## 2013-01-23 NOTE — Progress Notes (Signed)
  Subjective:    Patient ID: Beryle Flock, female    DOB: [birth year], 67 y.o.   MRN: [id number]  HPI Hyperlipidemia- had GYN appt and had blood work done.  Was started on Lipitor last year but did not continue meds.  Total cholesterol 272, LDL 191.  Pt is 'terrified' of taking something that would harm joints or muscles or cause dementia.  Aware that her levels put her at risk for MI and CVA- 'i guess that's scary too'.  Exercising regularly.  Eating well.   Review of Systems For ROS see HPI     Objective:   Physical Exam  Vitals reviewed. Constitutional: She is oriented to person, place, and time. She appears well-developed and well-nourished. No distress.  Neurological: She is alert and oriented to person, place, and time.  Psychiatric: She has a normal mood and affect. Her behavior is normal. Judgment and thought content normal.          Assessment & Plan:

## 2013-03-03 ENCOUNTER — Telehealth: Payer: Self-pay | Admitting: General Practice

## 2013-03-03 NOTE — Telephone Encounter (Signed)
Pt called stating she has been on the Zetia samples for 7 weeks. Would like to know if she can have more samples or could we send in a prescription. Seen in last OV that we are going to recheck lipids in 3 months. Please advise.   [facility] [address]

## 2013-03-03 NOTE — Telephone Encounter (Signed)
Ok for Zetia samples if available- can also send script for Zetia, 1 tab daily, #30, 6 refills

## 2013-03-04 MED ORDER — EZETIMIBE 10 MG PO TABS: 10.0000 mg | ORAL_TABLET | Freq: Every day | ORAL | Status: DC

## 2013-03-04 NOTE — Telephone Encounter (Signed)
Spoke with the pt and informed her that Dr. Tawni Carnes the samples of Zetia, which will be left up front for her to pick-up.  Also will sent in a rx to the pharmacy.  Pt agreed.  Rx sent to the pharmacy by e-script.//AB/CMA

## 2013-04-26 ENCOUNTER — Other Ambulatory Visit: Payer: Self-pay | Admitting: Obstetrics & Gynecology

## 2013-04-27 NOTE — Telephone Encounter (Signed)
eScribe request for refill on PREMARIN VAGINAL CREAM Last filled - 01/08/13, 42.5G X 1 REFILL (please see 01/08/13 phone note) Last AEX - 12/25/12 Next AEX - not scheduled Please advise refills.  Chart on your desk.

## 2013-07-29 ENCOUNTER — Other Ambulatory Visit: Payer: Self-pay | Admitting: Obstetrics & Gynecology

## 2013-07-29 ENCOUNTER — Telehealth: Payer: Self-pay | Admitting: Obstetrics & Gynecology

## 2013-07-29 DIAGNOSIS — Z1231 Encounter for screening mammogram for malignant neoplasm of breast: Secondary | ICD-10-CM

## 2013-07-29 DIAGNOSIS — Z13820 Encounter for screening for osteoporosis: Secondary | ICD-10-CM

## 2013-07-29 NOTE — Telephone Encounter (Signed)
Needs 3D.  Yes fine to do Dexa.  Do you want me to place orders?

## 2013-07-29 NOTE — Telephone Encounter (Signed)
Dr. Hyacinth Meeker,  Last AEX 12/25/12.  Patient with implants.  Last Dexa 10/04/2009.  Last screening mammogram 08/04/2009.  Do you have recommendations for type of Mammogram she should receive? Okay to order Dexa?

## 2013-07-29 NOTE — Telephone Encounter (Signed)
Spoke with patient. She would like to have tests done at [facility].  Order placed for Dexa.  Advised patient she would need to request 3D and sometimes there is an additional $50 charge for 3D. Patient will call to schedule appointments.

## 2013-07-29 NOTE — Telephone Encounter (Signed)
Patient got a letter in the mail that it was time for her to schedule her mammogram and she couldn't remember what dr. Hyacinth Meeker said about what she wanted. She said something about going to a specific place that does a 360 view and something about a bone density.

## 2013-08-10 ENCOUNTER — Telehealth: Payer: Self-pay | Admitting: Obstetrics & Gynecology

## 2013-08-10 NOTE — Telephone Encounter (Signed)
Patient would like a recommendation on who to see regarding evaluation and treatment of hemorrhoids.

## 2013-08-11 NOTE — Telephone Encounter (Signed)
For surgical removal of external hemorrhoids, Cassandra Little. For banding of internal hemorrhoids, [facility].

## 2013-08-12 NOTE — Telephone Encounter (Signed)
I called and spoke with patient and gave her the information she requested regarding evaluating/treating her hemorrhoids.

## 2013-08-24 ENCOUNTER — Encounter (INDEPENDENT_AMBULATORY_CARE_PROVIDER_SITE_OTHER): Payer: Self-pay | Admitting: General Surgery

## 2013-08-24 ENCOUNTER — Telehealth (INDEPENDENT_AMBULATORY_CARE_PROVIDER_SITE_OTHER): Payer: Self-pay | Admitting: *Deleted

## 2013-08-24 ENCOUNTER — Ambulatory Visit (INDEPENDENT_AMBULATORY_CARE_PROVIDER_SITE_OTHER): Payer: BC Managed Care – PPO | Admitting: General Surgery

## 2013-08-24 VITALS — BP 120/78 | HR 80 | Temp 98.6°F | Resp 15 | Ht 61.0 in | Wt 164.0 lb

## 2013-08-24 DIAGNOSIS — K644 Residual hemorrhoidal skin tags: Secondary | ICD-10-CM

## 2013-08-24 MED ORDER — HYDROCORTISONE 2.5 % RE CREA: 1.0000 "application " | TOPICAL_CREAM | Freq: Two times a day (BID) | RECTAL | Status: DC

## 2013-08-24 NOTE — Patient Instructions (Signed)
HEMORRHOIDS    Did you know... Hemorrhoids are one of the most common ailments known.  More than half the population will develop hemorrhoids, usually after age 65.  Millions of Americans currently suffer from hemorrhoids.  The average person suffers in silence for a long period before seeking medical care.  Today's treatment methods make some types of hemorrhoid removal much less painful.  What are hemorrhoids? Often described as "varicose veins of the anus and rectum", hemorrhoids are enlarged, bulging blood vessels in and about the anus and lower rectum. There are two types of hemorrhoids: external and internal, which refer to their location.  External (outside) hemorrhoids develop near the anus and are covered by very sensitive skin. These are usually painless. However, if a blood clot (thrombosis) develops in an external hemorrhoid, it becomes a painful, hard lump. The external hemorrhoid may bleed if it ruptures. Internal (inside) hemorrhoids develop within the anus beneath the lining. Painless bleeding and protrusion during bowel movements are the most common symptom. However, an internal hemorrhoid can cause severe pain if it is completely "prolapsed" - protrudes from the anal opening and cannot be pushed back inside.   What causes hemorrhoids? An exact cause is unknown; however, the upright posture of humans alone forces a great deal of pressure on the rectal veins, which sometimes causes them to bulge. Other contributing factors include:  . Aging  . Chronic constipation or diarrhea  . Pregnancy  . Heredity  . Straining during bowel movements  . Faulty bowel function due to overuse of laxatives or enemas . Spending long periods of time (e.g., reading) on the toilet  Whatever the cause, the tissues supporting the vessels stretch. As a result, the vessels dilate; their walls become thin and bleed. If the stretching and pressure continue, the weakened vessels protrude.  What are the  symptoms? If you notice any of the following, you could have hemorrhoids:  . Bleeding during bowel movements  . Protrusion during bowel movements . Itching in the anal area  . Pain  . Sensitive lump(s)  How are hemorrhoids treated? Mild symptoms can be relieved frequently by increasing the amount of fiber (e.g., fruits, vegetables, breads and cereals) and fluids in the diet. Eliminating excessive straining reduces the pressure on hemorrhoids and helps prevent them from protruding. A sitz bath - sitting in plain warm water for about 10 minutes - can also provide some relief . With these measures, the pain and swelling of most symptomatic hemorrhoids will decrease in two to seven days, and the firm lump should recede within four to six weeks. In cases of severe or persistent pain from a thrombosed hemorrhoid, your physician may elect to remove the hemorrhoid containing the clot with a small incision. Performed under local anesthesia as an outpatient, this procedure generally provides relief. Severe hemorrhoids may require special treatment, much of which can be performed on an outpatient basis.  . Ligation - the rubber band treatment - works effectively on internal hemorrhoids that protrude with bowel movements. A small rubber band is placed over the hemorrhoid, cutting off its blood supply. The hemorrhoid and the band fall off in a few days and the wound usually heals in a week or two. This procedure sometimes produces mild discomfort and bleeding and may need to be repeated for a full effect.  There is a more intense version of this procedure that is done in the OR as outpatient surgery called THD.  It involves identifying blood vessels leading to the   hemorrhoids and then tying them off with sutures.  This method is a little more painful than rubber band ligation but less painful than traditional hemorrhoidectomy and usually does not have to be repeated.  It is best for internal hemorrhoids that  bleed.  Rubber Band Ligation of Internal Hemorrhoids:  A.  Bulging, bleeding, internal hemorrhoid B.  Rubber band applied at the base of the hemorrhoid C.  About 7 days later, the banded hemorrhoid has fallen off leaving a small scar (arrow)  . Injection and Coagulation can also be used on bleeding hemorrhoids that do not protrude. Both methods are relatively painless and cause the hemorrhoid to shrivel up. Marland Kitchen Hemorrhoidectomy - surgery to remove the hemorrhoids - is the most complete method for removal of internal and external hemorrhoids. It is necessary when (1) clots repeatedly form in external hemorrhoids; (2) ligation fails to treat internal hemorrhoids; (3) the protruding hemorrhoid cannot be reduced; or (4) there is persistent bleeding. A hemorrhoidectomy removes excessive tissue that causes the bleeding and protrusion. It is done under anesthesia using sutures, and may, depending upon circumstances, require hospitalization and a period of inactivity. Laser hemorrhoidectomies do not offer any advantage over standard operative techniques. They are also quite expensive, and contrary to popular belief, are no less painful.  Do hemorrhoids lead to cancer? No. There is no relationship between hemorrhoids and cancer. However, the symptoms of hemorrhoids, particularly bleeding, are similar to those of colorectal cancer and other diseases of the digestive system. Therefore, it is important that all symptoms are investigated by a physician specially trained in treating diseases of the colon and rectum and that everyone 50 years or older undergo screening tests for colorectal cancer. Do not rely on over-the-counter medications or other self-treatments. See a colorectal surgeon first so your symptoms can be properly evaluated and effective treatment prescribed.  2012 American Society of Colon & Rectal Surgeons      GETTING TO GOOD BOWEL HEALTH. Irregular bowel habits such as diarrhea can lead to  many problems over time.  Having one soft, formed bowel movement a day is the most important way to prevent further problems.  The anorectal canal is designed to handle stretching and feces to safely manage our ability to get rid of solid waste (feces, poop, stool) out of our body.  BUT, diarrhea can be a burning fire to this very sensitive area of our body, causing inflamed hemorrhoids, anal fissures, increasing risk is perirectal abscesses, abdominal pain and bloating.     The goal: ONE SOFT BOWEL MOVEMENT A DAY!  To have soft, regular bowel movements:    Drink at least 8 tall glasses of water a day.     Take plenty of fiber.  Fiber is the undigested part of plant food that passes into the colon, acting s "natures broom" to encourage bowel motility and movement.  Fiber can absorb and hold large amounts of water. This results in a larger, bulkier stool, which is soft and easier to pass. Work gradually over several weeks up to 6 servings a day of fiber (25g a day even more if needed) in the form of: o Vegetables -- Root (potatoes, carrots, turnips), leafy green (lettuce, salad greens, celery, spinach), or cooked high residue (cabbage, broccoli, etc) o Fruit -- Fresh (unpeeled skin & pulp), Dried (prunes, apricots, cherries, etc ),  or stewed ( applesauce)  o Whole grain breads, pasta, etc (whole wheat)  o Bran cereals    Bulking Agents -- This type  of water-retaining fiber generally is easily obtained each day by one of the following:  o Methylcellulose -- This is another fiber derived from wood which also retains water. It is available as Citrucel and Benefiber   Controlling diarrhea o Switch to liquids and simpler foods for a few days to avoid stressing your intestines further. o Avoid dairy products (especially milk & ice cream) for a short time.  The intestines often can lose the ability to digest lactose when stressed. o Avoid foods that cause gassiness or bloating.  Typical foods include beans and  other legumes, cabbage, broccoli, and dairy foods.  Every person has some sensitivity to other foods, so listen to our body and avoid those foods that trigger problems for you. o Adding fiber (Citrucel, Metamucil, psyllium, Miralax) gradually can help thicken stools by absorbing excess fluid and retrain the intestines to act more normally.  Slowly increase the dose over a few weeks.  Too much fiber too soon can backfire and cause cramping & bloating. o Probiotics (such as active yogurt, Align, etc) may help repopulate the intestines and colon with normal bacteria and calm down a sensitive digestive tract.  Most studies show it to be of mild help, though, and such products can be costly. o Medicines:   Bismuth subsalicylate (ex. Kayopectate, Pepto Bismol) every 30 minutes for up to 6 doses can help control diarrhea.  Avoid if pregnant.   Loperamide (Immodium) can slow down diarrhea.  Start with two tablets (4mg  total) first and then try one tablet every 6 hours.  Avoid if you are having fevers or severe pain.  If you are not better or start feeling worse, stop all medicines and call your doctor for advice o Call your doctor if you are getting worse or not better.  Sometimes further testing (cultures, endoscopy, X-ray studies, bloodwork, etc) may be needed to help diagnose and treat the cause of the diarrhea. o

## 2013-08-24 NOTE — Telephone Encounter (Signed)
I spoke with pt to inform her of her appt with Dr. Leone Payor at [facility] on 09/25/13 at 3:45pm.  I provided pt with their address and phone number.  Pt agreeable.  [facility] [address] [place] [phone]

## 2013-08-24 NOTE — Progress Notes (Signed)
Chief Complaint  Patient presents with  . New Evaluation    2nd opinion [facility]    HISTORY: Cassandra Little is a 67 y.o. female who presents to the office with diarrhea and anal burning.  Other symptoms include itching and anal pain.  This had been occurring for about a year.  She has tried suppositories, Rx creams in the past which helps some.  Multiple BM's and sitting makes the symptoms worse.   It is continuous in nature.  Her bowel habits regular (3-4 BM's/day) and her bowel movements are loose.  Her fiber intake is dietary.  Her last colonoscopy was 12/09 and is due again in 2019.   Past Medical History  Diagnosis Date  . Hypertension   . Hyperlipidemia   . Migraine   . Depression     Dr. Raquel Little  . Anaphylaxis   . Internal and external hemorrhoids without complication   . Anxiety   . Arthritis   . Sleep apnea     ??  . Rosacea   . Cough   . Rectal bleeding       Past Surgical History  Procedure Laterality Date  . Tonsillectomy  1962  . Total shoulder replacement  2009    right  . Colonoscopy  09/15/2008    internal and external hemorroids  . Cosmetic surgery      multiple  . Appendectomy  2004  . Cesarean section  1982, 1985    x 2        Current Outpatient Prescriptions  Medication Sig Dispense Refill  . ALPRAZolam (XANAX) 0.5 MG tablet Take 0.5 mg by mouth daily.        Marland Kitchen buPROPion (WELLBUTRIN XL) 300 MG 24 hr tablet Take 300 mg by mouth daily.       . cetirizine (ZYRTEC) 10 MG tablet Take 10 mg by mouth daily as needed.        . conjugated estrogens (PREMARIN) vaginal cream Place vaginally 2 (two) times a week. Place 1/2 gram vaginally twice a week at bedtime.  42.5 g  2  . eletriptan (RELPAX) 20 MG tablet One tablet by mouth as needed for migraine headache.  If the headache improves and then returns, dose may be repeated after 2 hours have elapsed since first dose (do not exceed 80 mg per day). may repeat in 2 hours if necessary       . EPINEPHrine (EPIPEN) 0.3  mg/0.3 mL DEVI Inject 0.3 mLs (0.3 mg total) into the muscle once.  2 Device  1  . FLUoxetine (PROZAC) 40 MG capsule Take 40 mg by mouth daily.       . hydrocortisone (PROCTOZONE-HC) 2.5 % rectal cream Place 1 application rectally 2 (two) times daily.  30 g  0   No current facility-administered medications for this visit.      Allergies  Allergen Reactions  . Ace Inhibitors     REACTION: cough      Family History  Problem Relation Age of Onset  . Hyperlipidemia Mother   . Heart disease Mother   . Diabetes Father   . Hypertension Father   . Heart attack Father     55s  . Heart disease Father   . Obesity Sister   . Kidney disease Sister   . Colon cancer Neg Hx     History   Social History  . Marital Status: Divorced    Spouse Name: N/A    Number of Children: N/A  . Years  of Education: N/A   Social History Main Topics  . Smoking status: Never Smoker   . Smokeless tobacco: Never Used  . Alcohol Use: Yes     Comment: occasional wine  . Drug Use: No  . Sexual Activity: None   Other Topics Concern  . None   Social History Narrative  . None      REVIEW OF SYSTEMS - PERTINENT POSITIVES ONLY: Review of Systems - General ROS: negative for - chills, fever or weight loss Hematological and Lymphatic ROS: negative for - bleeding problems, blood clots or bruising Respiratory ROS: no cough, shortness of breath, or wheezing Cardiovascular ROS: no chest pain or dyspnea on exertion Gastrointestinal ROS: positive for - abdominal pain negative for - blood in stools, constipation, gas/bloating or nausea/vomiting Genito-Urinary ROS: no dysuria, trouble voiding, or hematuria  EXAM: Filed Vitals:   08/24/13 1139  BP: 120/78  Pulse: 80  Temp: 98.6 F (37 C)  Resp: 15    General appearance: alert and cooperative Resp: clear to auscultation bilaterally Cardio: regular rate and rhythm GI: normal findings: soft, non-tender   Procedure: Anoscopy Surgeon: Cassandra Fus Diagnosis:  anal pain  Assistant: Cassandra Little After the risks and benefits were explained, verbal consent was obtained for above procedure  Anesthesia: none Findings: Moderate external skin tags with anterior inflammation, grade 2 internal non-inflamed hemorrhoids, moderate rectocele    ASSESSMENT AND PLAN: Cassandra Little is a 67 y.o. F with new onset diarrhea.  On exam she has some mildly inflamed external hemorrhoids.  I have suggested starting benefiber and imodium before meals.  I would like her to see Dr Cassandra Little again as well.  I will give her a hemorrhoid cream to help with irritation.  If this fails to work, then the next step would be hemorrhoidectomy.      Vanita Panda, MD Colon and Rectal Surgery / General Surgery [facility]      Visit Diagnoses: 1. External hemorrhoids with complication     Primary Care Physician: Cassandra Rhymes, MD

## 2013-08-31 ENCOUNTER — Ambulatory Visit (HOSPITAL_COMMUNITY)
Admission: RE | Admit: 2013-08-31 | Discharge: 2013-08-31 | Disposition: A | Discharge status: Home / Self Care | Payer: BC Managed Care – PPO | Source: Ambulatory Visit | Attending: Obstetrics & Gynecology | Admitting: Obstetrics & Gynecology

## 2013-08-31 ENCOUNTER — Other Ambulatory Visit: Payer: Self-pay | Admitting: Obstetrics & Gynecology

## 2013-08-31 DIAGNOSIS — Z1231 Encounter for screening mammogram for malignant neoplasm of breast: Secondary | ICD-10-CM

## 2013-08-31 DIAGNOSIS — Z780 Asymptomatic menopausal state: Secondary | ICD-10-CM | POA: Insufficient documentation

## 2013-08-31 DIAGNOSIS — Z13820 Encounter for screening for osteoporosis: Secondary | ICD-10-CM | POA: Insufficient documentation

## 2013-09-25 ENCOUNTER — Encounter: Payer: Self-pay | Admitting: Internal Medicine

## 2013-09-25 ENCOUNTER — Ambulatory Visit (INDEPENDENT_AMBULATORY_CARE_PROVIDER_SITE_OTHER): Payer: BC Managed Care – PPO | Admitting: Internal Medicine

## 2013-09-25 VITALS — BP 126/82 | HR 70 | Ht 61.0 in | Wt 162.0 lb

## 2013-09-25 DIAGNOSIS — K648 Other hemorrhoids: Secondary | ICD-10-CM

## 2013-09-25 DIAGNOSIS — R197 Diarrhea, unspecified: Secondary | ICD-10-CM | POA: Insufficient documentation

## 2013-09-25 NOTE — Patient Instructions (Signed)
HEMORRHOID BANDING PROCEDURE    FOLLOW-UP CARE   1. The procedure you have had should have been relatively painless since the banding of the area involved does not have nerve endings and there is no pain sensation.  The rubber band cuts off the blood supply to the hemorrhoid and the band may fall off as soon as 48 hours after the banding (the band may occasionally be seen in the toilet bowl following a bowel movement). You may notice a temporary feeling of fullness in the rectum which should respond adequately to plain Tylenol or Motrin.  2. Following the banding, avoid strenuous exercise that evening and resume full activity the next day.  A sitz bath (soaking in a warm tub) or bidet is soothing, and can be useful for cleansing the area after bowel movements.     3. To avoid constipation, take two tablespoons of natural wheat bran, natural oat bran, flax, Benefiber or any over the counter fiber supplement and increase your water intake to 7-8 glasses daily.    4. Unless you have been prescribed anorectal medication, do not put anything inside your rectum for two weeks: No suppositories, enemas, fingers, etc.  5. Occasionally, you may have more bleeding than usual after the banding procedure.  This is often from the untreated hemorrhoids rather than the treated one.  Don't be concerned if there is a tablespoon or so of blood.  If there is more blood than this, lie flat with your bottom higher than your head and apply an ice pack to the area. If the bleeding does not stop within a half an hour or if you feel faint, call our office at [phone] or go to the emergency room.  6. Problems are not common; however, if there is a substantial amount of bleeding, severe pain, chills, fever or difficulty passing urine (very rare) or other problems, you should call us at [phone] or report to the nearest emergency room.  7. Do not stay seated continuously for more than 2-3 hours for a day or two  after the procedure.  Tighten your buttock muscles 10-15 times every two hours and take 10-15 deep breaths every 1-2 hours.  Do not spend more than a few minutes on the toilet if you cannot empty your bowel; instead re-visit the toilet at a later time.    Use your Imodium as discussed.  We will see you in January for your next banding appointment.  I appreciate the opportunity to care for you.

## 2013-09-25 NOTE — Progress Notes (Signed)
    Subjective:    Patient ID: Cassandra Little, female    DOB: 1946-05-07, 67 y.o.   MRN: [id number]  HPI Patient is a pleasant lady I know from previous colonoscopy 5 years ago. She recently had problems with irritated hemorrhoids in the setting of diarrhea. She said for the past several years she's had frequent urgent loose stools. When she saw Dr. Maisie Fus of surgery, she was noted that internal next oral hemorrhoids as well as skin tags. Dr. Maisie Fus told her to start Benefiber and the use of Imodium to, diarrhea down above the hemorrhoids with improved. The diarrhea is really much better, only taking Imodium every 2 days or so life is good the patient says. She is still concerned about her hemorrhoids and is asking for treatment. She describes intermittent swelling irritation itching and rectal bleeding. It comes and goes and she's had problems for decades. She does not strain to stool. She might feel prolapse, from what I can tell it sounds like she has some bulging in the anorectum when she defecates at times it retract spontaneously. She has had what sounds like thrombosed hemorrhoids in the past. She has difficulty cleansing, with skin tags are present and understands that those would need to be surgically removed. Medications, allergies, past medical history, past surgical history, family history and social history are reviewed and updated in the EMR.  Review of Systems As per history of present illness    Objective:   Physical Exam Well-developed well-nourished no acute distress Rectal exam with female chaperone present shows some small to medium anal tags. Digital rectal exam reveals no mass. An anoscopic evaluation revealed grade 2 internal hemorrhoids in all positions and some smaller external hemorrhoids in the canal.    Assessment & Plan:   1. Internal hemorrhoids with bleeding, prolapse, itching   2. Diarrhea question IBS, could be a microscopic colitis, symptomatically improved  with every other day to every second day loperamide     PROCEDURE NOTE: The patient presents with symptomatic grade 2  hemorrhoids, requesting rubber band ligation of his/her hemorrhoidal disease.  All risks, benefits and alternative forms of therapy were described and informed consent was obtained.  The decision was made to band the RP internal hemorrhoid, and the [facility] System was used to perform band ligation without complication.  Digital anorectal examination was then performed to assure proper positioning of the band, and to adjust the banded tissue as required.  The patient was discharged home without pain or other issues.  Dietary and behavioral recommendations were given and along with follow-up instructions.     She will continue loperamide for diarrhea.  The patient will return in early January 2015  for  follow-up and possible additional banding as required. No complications were encountered and the patient tolerated the procedure well.

## 2013-09-25 NOTE — Assessment & Plan Note (Addendum)
Asking for banding today - does not want a hemorrhoidectomy. She is better with control of diarrhea but is concerned about recurrent hemorrhoid problems. I explained nature of banding, risks and benefits. I explained that anal tags will not resolve but could possibly improve with banding, but would take time. She wanted to proceed with hemorrhoidal banding so this was done - RP column banded without difficulty and she will return in Jan 2015 for RA and LL banding.

## 2013-09-25 NOTE — Assessment & Plan Note (Signed)
She is well on loperamide every 1-2 days so will continue. I reviewed possible causes like IBD or microscopic colitis and how colonoscopy could detect these. She prefers to treat with loperamide and given negative 2009 colonoscopy I am comfortable with that and will f/u with me in January.

## 2013-10-20 ENCOUNTER — Encounter: Payer: Self-pay | Admitting: Internal Medicine

## 2013-10-20 ENCOUNTER — Ambulatory Visit (INDEPENDENT_AMBULATORY_CARE_PROVIDER_SITE_OTHER): Payer: BC Managed Care – PPO | Admitting: Internal Medicine

## 2013-10-20 VITALS — BP 130/84 | HR 84 | Ht 61.0 in | Wt 164.1 lb

## 2013-10-20 DIAGNOSIS — K648 Other hemorrhoids: Secondary | ICD-10-CM

## 2013-10-20 NOTE — Progress Notes (Signed)
Patient ID: Cassandra Little, female   DOB: April 11, 1946, 68 y.o.   MRN: [id number]              PROCEDURE NOTE: The patient presents with symptomatic grade 1  hemorrhoids, requesting rubber band ligation of his/her hemorrhoidal disease.  All risks, benefits and alternative forms of therapy were described and informed consent was obtained.  The decision was made to band the right anterior and left lateral internal hemorrhoids, and the [place] was used to perform band ligation without complication.  Digital anorectal examination was then performed to assure proper positioning of the band, and to adjust the banded tissue as required.  The patient was discharged home without pain or other issues.  Dietary and behavioral recommendations were given and along with follow-up instructions.     The following adjunctive treatments were recommended:  Continue Imodium for diarrhea predominant IBS  The patient will return as needed for  follow-up and possible additional banding as required. No complications were encountered and the patient tolerated the procedure well.

## 2013-10-20 NOTE — Patient Instructions (Addendum)
HEMORRHOID BANDING PROCEDURE    FOLLOW-UP CARE   1. The procedure you have had should have been relatively painless since the banding of the area involved does not have nerve endings and there is no pain sensation.  The rubber band cuts off the blood supply to the hemorrhoid and the band may fall off as soon as 48 hours after the banding (the band may occasionally be seen in the toilet bowl following a bowel movement). You may notice a temporary feeling of fullness in the rectum which should respond adequately to plain Tylenol or Motrin.  2. Following the banding, avoid strenuous exercise that evening and resume full activity the next day.  A sitz bath (soaking in a warm tub) or bidet is soothing, and can be useful for cleansing the area after bowel movements.     3. To avoid constipation, take two tablespoons of natural wheat bran, natural oat bran, flax, Benefiber or any over the counter fiber supplement and increase your water intake to 7-8 glasses daily.    4. Unless you have been prescribed anorectal medication, do not put anything inside your rectum for two weeks: No suppositories, enemas, fingers, etc.  5. Occasionally, you may have more bleeding than usual after the banding procedure.  This is often from the untreated hemorrhoids rather than the treated one.  Don't be concerned if there is a tablespoon or so of blood.  If there is more blood than this, lie flat with your bottom higher than your head and apply an ice pack to the area. If the bleeding does not stop within a half an hour or if you feel faint, call our office at [phone] or go to the emergency room.  6. Problems are not common; however, if there is a substantial amount of bleeding, severe pain, chills, fever or difficulty passing urine (very rare) or other problems, you should call us at [phone] or report to the nearest emergency room.  7. Do not stay seated continuously for more than 2-3 hours for a day or two  after the procedure.  Tighten your buttock muscles 10-15 times every two hours and take 10-15 deep breaths every 1-2 hours.  Do not spend more than a few minutes on the toilet if you cannot empty your bowel; instead re-visit the toilet at a later time.      I appreciate the opportunity to care for you.         

## 2014-06-11 ENCOUNTER — Other Ambulatory Visit: Payer: Self-pay | Admitting: Family Medicine

## 2014-06-11 NOTE — Telephone Encounter (Signed)
Please advise pt has not been seen in a year.

## 2014-06-14 NOTE — Telephone Encounter (Signed)
Med filled.  

## 2014-06-18 ENCOUNTER — Telehealth: Payer: Self-pay | Admitting: General Practice

## 2014-06-18 NOTE — Telephone Encounter (Signed)
Last OV 01-23-13, no upcoming appts  I have this rx on pt med list but do not show [place] ever filling it. Please advise.

## 2014-06-22 NOTE — Telephone Encounter (Signed)
[facility] for refill but pt needs to schedule CPE

## 2014-06-23 MED ORDER — ELETRIPTAN HYDROBROMIDE 20 MG PO TABS: 20.0000 mg | ORAL_TABLET | ORAL | Status: DC | PRN

## 2014-06-23 NOTE — Telephone Encounter (Signed)
Med filled.  

## 2014-07-05 ENCOUNTER — Telehealth: Payer: Self-pay

## 2014-07-05 NOTE — Telephone Encounter (Signed)
Have you seen this ?

## 2014-07-05 NOTE — Telephone Encounter (Signed)
Cassandra Little [phone] [facility]  [place] called to check on her refill for eletriptan (RELPAX) 20 MG tablet, she understood from pharmacy we had not sent in, after some checking I found out we sent it in but insurance wants prior authorization on it.

## 2014-07-16 ENCOUNTER — Telehealth: Payer: Self-pay | Admitting: *Deleted

## 2014-07-16 NOTE — Telephone Encounter (Signed)
PA for Relpax 20 mg tablet initiated through [email or web address]. Awaiting determination. JG//CMA

## 2014-07-19 NOTE — Telephone Encounter (Signed)
PA for Relpax approved effective 07/16/2014 through 07/16/2016. Approval letter sent for scanning. JG//CMA

## 2014-07-22 NOTE — Telephone Encounter (Signed)
error 

## 2014-08-12 ENCOUNTER — Other Ambulatory Visit: Payer: Self-pay | Admitting: Obstetrics & Gynecology

## 2014-08-12 DIAGNOSIS — Z1231 Encounter for screening mammogram for malignant neoplasm of breast: Secondary | ICD-10-CM

## 2014-08-16 ENCOUNTER — Encounter: Payer: Self-pay | Admitting: Internal Medicine

## 2014-08-17 ENCOUNTER — Telehealth: Payer: Self-pay | Admitting: Internal Medicine

## 2014-08-17 NOTE — Telephone Encounter (Signed)
Patient would like to have rectal skin tags removed.  I have advised her that she would need to see Dr. Marcello Moores again at [place], that Dr. Carlean Purl can't remove.

## 2014-09-06 ENCOUNTER — Other Ambulatory Visit: Payer: Self-pay | Admitting: Obstetrics & Gynecology

## 2014-09-06 ENCOUNTER — Ambulatory Visit (HOSPITAL_COMMUNITY)
Admission: RE | Admit: 2014-09-06 | Discharge: 2014-09-06 | Disposition: A | Discharge status: Home / Self Care | Payer: BC Managed Care – PPO | Source: Ambulatory Visit | Attending: Obstetrics & Gynecology | Admitting: Obstetrics & Gynecology

## 2014-09-06 DIAGNOSIS — Z1231 Encounter for screening mammogram for malignant neoplasm of breast: Secondary | ICD-10-CM | POA: Diagnosis not present

## 2014-11-25 ENCOUNTER — Ambulatory Visit (INDEPENDENT_AMBULATORY_CARE_PROVIDER_SITE_OTHER): Payer: BLUE CROSS/BLUE SHIELD | Admitting: Medical

## 2014-11-25 ENCOUNTER — Encounter: Payer: Self-pay | Admitting: Medical

## 2014-11-25 VITALS — BP 138/91 | HR 71 | Temp 98.5°F | Ht 61.0 in | Wt 162.6 lb

## 2014-11-25 DIAGNOSIS — K2100 Gastro-esophageal reflux disease with esophagitis, without bleeding: Secondary | ICD-10-CM

## 2014-11-25 NOTE — Progress Notes (Signed)
Pre visit review using our clinic review tool, if applicable. No additional management support is needed unless otherwise documented below in the visit note. 

## 2014-11-25 NOTE — Progress Notes (Signed)
Subjective:    Patient ID: Cassandra Little, female    DOB: Sep 17, 1946, 69 y.o.   MRN: [id number]  HPI  Pt has history of reflux. She does not want to be on meds. Pt has had some burning of  her upper throat/esophagus. Mild hoarse voice and dry cough. Pt tries to eat healthy. Some recent alcohol after recent very stressful event. Gerd symptoms for about a week.  When she was being yelled at by other employee at work her throat felt like it was tightening. But no sob or wheezing. Hx of anaphylactic reaction(etiology ??) but she does not describe type event. She has epipen available if that were to occur randomly.     Review of Systems  Constitutional: Negative for fever, chills, diaphoresis, activity change and fatigue.  Respiratory: Negative for cough, chest tightness and shortness of breath.   Cardiovascular: Negative for chest pain, palpitations and leg swelling.  Gastrointestinal: Positive for abdominal pain. Negative for nausea and vomiting.       But more upper esophaugus gerd symptoms.  Musculoskeletal: Negative for back pain, neck pain and neck stiffness.  Neurological: Negative for dizziness, tremors, seizures, syncope, facial asymmetry, speech difficulty, weakness, light-headedness, numbness and headaches.  Psychiatric/Behavioral: Negative for behavioral problems, confusion and agitation. The patient is not nervous/anxious.     Past Medical History  Diagnosis Date  . Hypertension   . Hyperlipidemia   . Migraine   . Depression     Dr. Abner Greenspan  . Anaphylaxis   . Anxiety   . Arthritis   . Sleep apnea     ??  . Rosacea   . Cough   . Internal and external bleeding hemorrhoids     History   Social History  . Marital Status: Divorced    Spouse Name: N/A  . Number of Children: N/A  . Years of Education: N/A   Occupational History  . Not on file.   Social History Main Topics  . Smoking status: Never Smoker   . Smokeless tobacco: Never Used  . Alcohol Use: Yes     Comment: occasional wine  . Drug Use: No  . Sexual Activity: Not on file   Other Topics Concern  . Not on file   Social History Narrative    Past Surgical History  Procedure Laterality Date  . Tonsillectomy  1962  . Total shoulder replacement  2009    right  . Colonoscopy  09/15/2008    internal and external hemorroids  . Cosmetic surgery      multiple  . Appendectomy  2004  . Cesarean section  1982, 1985    x 2  . Hemorrhoid banding  2014    Family History  Problem Relation Age of Onset  . Hyperlipidemia Mother   . Heart disease Mother   . Diabetes Father   . Hypertension Father   . Heart attack Father     53s  . Heart disease Father   . Obesity Sister   . Kidney disease Sister   . Colon cancer Neg Hx     Allergies  Allergen Reactions  . Ace Inhibitors     REACTION: cough    Current Outpatient Prescriptions on File Prior to Visit  Medication Sig Dispense Refill  . buPROPion (WELLBUTRIN XL) 300 MG 24 hr tablet Take 300 mg by mouth daily.     . cetirizine (ZYRTEC) 10 MG tablet Take 10 mg by mouth daily as needed.      . eletriptan (  RELPAX) 20 MG tablet Take 1 tablet (20 mg total) by mouth as needed. may repeat in 2 hours if necessary 10 tablet 2  . EPIPEN 2-PAK 0.3 MG/0.3ML SOAJ injection INJECT 0.3 MLS (0.3 MG TOTAL) INTO THE MUSCLE ONCE. 2 Device 1  . FLUoxetine (PROZAC) 40 MG capsule Take 40 mg by mouth daily.     . hydrocortisone (PROCTOZONE-HC) 2.5 % rectal cream Place 1 application rectally 2 (two) times daily. 30 g 0  . conjugated estrogens (PREMARIN) vaginal cream Place vaginally 2 (two) times a week. Place 1/2 gram vaginally twice a week at bedtime. (Patient not taking: Reported on 11/25/2014) 42.5 g 2   No current facility-administered medications on file prior to visit.    BP 138/91 mmHg  Pulse 71  Temp(Src) 98.5 F (36.9 C) (Oral)  Ht 5\' 1"  (1.549 m)  Wt 162 lb 9.6 oz (73.755 kg)  BMI 30.74 kg/m2  SpO2 98%       Objective:   Physical  Exam  General Appearance- Not in acute distress.  HEENT Eyes- Scleraeral/Conjuntiva-bilat- Not Yellow. Mouth & Throat- Normal.  Chest and Lung Exam Auscultation: Breath sounds:-Normal. Adventitious sounds:- No Adventitious sounds.  Cardiovascular Auscultation:Rythm - Regular. Heart Sounds -Normal heart sounds.  Abdomen Inspection:-Inspection Normal.  Palpation/Perucssion: Palpation and Percussion of the abdomen reveal- Non Tender, No Rebound tenderness, No rigidity(Guarding) and No Palpable abdominal masses.  Liver:-Normal.  Spleen:- Normal.        Assessment & Plan:

## 2014-11-25 NOTE — Patient Instructions (Addendum)
GERD Recent severe symptoms of Gerd. I would advise zantac 150 mg po bid.  Follow strict gerd diet. I would recommend GI evaluation for EGD due to long hx of reflux. Please let me know if you will agree to go.  Follow up in 2-3 weeks or as needed.

## 2014-11-25 NOTE — Assessment & Plan Note (Signed)
Recent severe symptoms of Gerd. I would advise zantac 150 mg po bid.  Follow strict gerd diet. I would recommend GI evaluation for EGD due to long hx of reflux. Please let me know if you will agree to go.  Follow up in 2-3 weeks or as needed.

## 2015-01-05 ENCOUNTER — Encounter: Payer: Self-pay | Admitting: Obstetrics & Gynecology

## 2015-01-05 ENCOUNTER — Ambulatory Visit (INDEPENDENT_AMBULATORY_CARE_PROVIDER_SITE_OTHER): Payer: BLUE CROSS/BLUE SHIELD | Admitting: Obstetrics & Gynecology

## 2015-01-05 VITALS — BP 138/88 | HR 68 | Resp 16 | Ht 60.75 in | Wt 165.2 lb

## 2015-01-05 DIAGNOSIS — N393 Stress incontinence (female) (male): Secondary | ICD-10-CM

## 2015-01-05 DIAGNOSIS — M1990 Unspecified osteoarthritis, unspecified site: Secondary | ICD-10-CM | POA: Diagnosis not present

## 2015-01-05 DIAGNOSIS — Z124 Encounter for screening for malignant neoplasm of cervix: Secondary | ICD-10-CM

## 2015-01-05 DIAGNOSIS — Z0000 Encounter for general adult medical examination without abnormal findings: Secondary | ICD-10-CM

## 2015-01-05 DIAGNOSIS — Z01419 Encounter for gynecological examination (general) (routine) without abnormal findings: Secondary | ICD-10-CM | POA: Diagnosis not present

## 2015-01-05 LAB — POCT URINALYSIS DIPSTICK
Bilirubin, UA: NEGATIVE
Blood, UA: NEGATIVE
Glucose, UA: NEGATIVE
Ketones, UA: NEGATIVE
Leukocytes, UA: NEGATIVE
Nitrite, UA: NEGATIVE
Protein, UA: NEGATIVE
Urobilinogen, UA: NEGATIVE
pH, UA: 5

## 2015-01-05 LAB — COMPREHENSIVE METABOLIC PANEL
ALK PHOS: 74 U/L (ref 39–117)
ALT: 28 U/L (ref 0–35)
AST: 19 U/L (ref 0–37)
Albumin: 4.1 g/dL (ref 3.5–5.2)
BUN: 26 mg/dL — AB (ref 6–23)
CHLORIDE: 103 meq/L (ref 96–112)
CO2: 24 mEq/L (ref 19–32)
CREATININE: 0.89 mg/dL (ref 0.50–1.10)
Calcium: 9.3 mg/dL (ref 8.4–10.5)
Glucose, Bld: 97 mg/dL (ref 70–99)
POTASSIUM: 4.8 meq/L (ref 3.5–5.3)
SODIUM: 138 meq/L (ref 135–145)
Total Bilirubin: 0.3 mg/dL (ref 0.2–1.2)
Total Protein: 6.5 g/dL (ref 6.0–8.3)

## 2015-01-05 LAB — LIPID PANEL
Cholesterol: 213 mg/dL — ABNORMAL HIGH (ref 0–200)
HDL: 51 mg/dL
LDL Cholesterol: 132 mg/dL — ABNORMAL HIGH (ref 0–99)
Total CHOL/HDL Ratio: 4.2 ratio
Triglycerides: 152 mg/dL — ABNORMAL HIGH
VLDL: 30 mg/dL (ref 0–40)

## 2015-01-05 LAB — TSH: TSH: 3.164 u[IU]/mL (ref 0.350–4.500)

## 2015-01-05 LAB — HEMOGLOBIN, FINGERSTICK: Hemoglobin, fingerstick: 12.8 g/dL (ref 12.0–16.0)

## 2015-01-05 NOTE — Progress Notes (Signed)
69 y.o. G2P2 DivorcedCaucasianF here for annual exam.  Doing well.  No vaginal bleeding.  Dr. Bennye Alm is PCP.  Pt hasn't seen in a year.  Pt is having some issues with urinary incontinence.  Worse with sneezing.  Also has some urinary urgency.  Did see Ileana Roup.  Would like more formal evaluation.  Options discussed.  She will retire in May.  Going to Yoga.  Having some pain issues.    No LMP recorded. Patient is postmenopausal.          Sexually active: No.  The current method of family planning is post menopausal status.    Exercising: Yes.    yoga, walking, and bike Smoker:  no  Health Maintenance: Pap:  12/25/12 WNL/negative HR HPV History of abnormal Pap:  no MMG:  09/06/14 3D-normal Colonoscopy:  2009-repeat in 10 years BMD:   08/31/13, 2.3/-0.8 TDaP:  ? PCP Screening Labs: today, Hb today: 12.8, Urine today: negative    reports that she has never smoked. She has never used smokeless tobacco. She reports that she drinks about 0.6 oz of alcohol per week. She reports that she does not use illicit drugs.  Past Medical History  Diagnosis Date  . Hypertension   . Hyperlipidemia   . Migraine   . Depression     Dr. Abner Greenspan  . Anaphylaxis   . Anxiety   . Arthritis   . Sleep apnea     ??  . Rosacea   . Cough   . Internal and external bleeding hemorrhoids     Past Surgical History  Procedure Laterality Date  . Tonsillectomy  1962  . Total shoulder replacement  2009    right  . Colonoscopy  09/15/2008    internal and external hemorroids  . Cosmetic surgery      multiple  . Appendectomy  2004  . Cesarean section  1982, 1985    x 2  . Hemorrhoid banding  2014    Current Outpatient Prescriptions  Medication Sig Dispense Refill  . buPROPion (WELLBUTRIN XL) 300 MG 24 hr tablet Take 300 mg by mouth daily.     . cetirizine (ZYRTEC) 10 MG tablet Take 10 mg by mouth daily as needed.      . eletriptan (RELPAX) 20 MG tablet Take 1 tablet (20 mg total) by mouth as needed.  may repeat in 2 hours if necessary 10 tablet 2  . EPIPEN 2-PAK 0.3 MG/0.3ML SOAJ injection INJECT 0.3 MLS (0.3 MG TOTAL) INTO THE MUSCLE ONCE. 2 Device 1  . FLUoxetine (PROZAC) 40 MG capsule Take 40 mg by mouth daily.     . hydrocortisone (PROCTOZONE-HC) 2.5 % rectal cream Place 1 application rectally 2 (two) times daily. 30 g 0  . conjugated estrogens (PREMARIN) vaginal cream Place vaginally 2 (two) times a week. Place 1/2 gram vaginally twice a week at bedtime. (Patient not taking: Reported on 01/05/2015) 42.5 g 2   No current facility-administered medications for this visit.    Family History  Problem Relation Age of Onset  . Hyperlipidemia Mother   . Heart disease Mother   . Diabetes Father   . Hypertension Father   . Heart attack Father     51s  . Heart disease Father   . Obesity Sister   . Kidney disease Sister   . Colon cancer Neg Hx     ROS:  Pertinent items are noted in HPI.  Otherwise, a comprehensive ROS was negative.  Exam:   BP  138/88 mmHg  Pulse 68  Resp 16  Ht 5' 0.75" (1.543 m)  Wt 165 lb 3.2 oz (74.934 kg)  BMI 31.47 kg/m2  LMP    Height: 5' 0.75" (154.3 cm)  Ht Readings from Last 3 Encounters:  01/05/15 5' 0.75" (1.543 m)  11/25/14 _0  (1.549 m)  10/20/13 _1  (1.549 m)    General appearance: alert, cooperative and appears stated age Head: Normocephalic, without obvious abnormality, atraumatic Neck: no adenopathy, supple, symmetrical, trachea midline and thyroid normal to inspection and palpation Lungs: clear to auscultation bilaterally Breasts: normal appearance, no masses or tenderness Heart: regular rate and rhythm Abdomen: soft, non-tender; bowel sounds normal; no masses,  no organomegaly Extremities: extremities normal, atraumatic, no cyanosis or edema Skin: Skin color, texture, turgor normal. No rashes or lesions Lymph nodes: Cervical, supraclavicular, and axillary nodes normal. No abnormal inguinal nodes palpated Neurologic: Grossly  normal   Pelvic: External genitalia:  no lesions              Urethra:  normal appearing urethra with no masses, tenderness or lesions              Bartholins and Skenes: normal                 Vagina: normal appearing vagina with normal color and discharge, no lesions, no prolapse              Cervix: no lesions              Pap taken: Yes.   Bimanual Exam:  Uterus:  normal size, contour, position, consistency, mobility, non-tender              Adnexa: normal adnexa and no mass, fullness, tenderness               Rectovaginal: Confirms               Anus:  normal sphincter tone, no lesions  Chaperone was present for exam.  A:  Well Woman with normal exam SUI. Desirous of evaluation and treatment recommendations. PMP, no HRT Breast implants Elevated lipids Arthritis External hemorrhoids.  S/p internal banding.  P:   Mammogram yearly pap smear.  H/O neg HR HPV 2014. Will have pt see TSH, Vit D, CMP, Lipids ESR Pneumonia vaccination due.  Pt will see Dr. Bennye Alm for this. return annually or prn

## 2015-01-06 ENCOUNTER — Telehealth: Payer: Self-pay | Admitting: *Deleted

## 2015-01-06 LAB — IPS PAP TEST WITH REFLEX TO HPV

## 2015-01-06 LAB — SEDIMENTATION RATE: Sed Rate: 1 mm/hr (ref 0–30)

## 2015-01-06 LAB — VITAMIN D 25 HYDROXY (VIT D DEFICIENCY, FRACTURES): VIT D 25 HYDROXY: 28 ng/mL — AB (ref 30–100)

## 2015-01-06 NOTE — Telephone Encounter (Signed)
Call to patient. Consult appointment scheduled with Dr Quincy Simmonds for Monday 01-17-15 at 11am per Dr Sabra Heck recommendation.  Brief explanation regarding urodynamic testing. Will schedule after consult with Dr Quincy Simmonds.  Routing to provider for final review. Patient agreeable to disposition. Will close encounter  CC: Dr Quincy Simmonds, Juluis Rainier.

## 2015-01-17 ENCOUNTER — Ambulatory Visit (INDEPENDENT_AMBULATORY_CARE_PROVIDER_SITE_OTHER): Payer: BLUE CROSS/BLUE SHIELD | Admitting: Obstetrics and Gynecology

## 2015-01-17 ENCOUNTER — Encounter: Payer: Self-pay | Admitting: Obstetrics and Gynecology

## 2015-01-17 VITALS — BP 140/86 | HR 76 | Temp 98.3°F | Ht 60.75 in | Wt 165.0 lb

## 2015-01-17 DIAGNOSIS — N3946 Mixed incontinence: Secondary | ICD-10-CM | POA: Diagnosis not present

## 2015-01-17 NOTE — Patient Instructions (Signed)
Let me know how I can help you further.

## 2015-01-17 NOTE — Progress Notes (Signed)
GYNECOLOGY  VISIT   HPI: 69 y.o.   Divorced  Caucasian  female   G2P2 with No LMP recorded. Patient is postmenopausal.   here for  Urinary Incontinence increasing for the last 2 - 3 years. Referred by Dr. Sabra Heck.   Leaks with sneeze.  Can leak sometimes with cough, sometimes with exercise.  Not subtracting from her life due to leakage.  Does wear panty liner at times.  Cannot wait to empty bladder.  DF - every 2 hours, increased with drinking water. NF - once per night.  No enuresis.   No dysuria, hematuria, UTIs/pyelonephritis/stones.   Voiding well.   Years ago had constant diarrhea and hemorrhoids.  Had hemorrhoidal banding.  Immodium helped.  Uses it sometimes now - once every 1 - 3 months.  No fecal incontinence.   Had PT with Ileana Roup.  One or two visits.  No other therapies or treatments for bladder.   C/S x 2. BTL with last C/S. Status post abdominoplasty.   GYNECOLOGIC HISTORY: No LMP recorded. Patient is postmenopausal. Contraception:  Post Menopausal   Menopausal hormone therapy: Premarin cream        OB History    Gravida Para Term Preterm AB TAB SAB Ectopic Multiple Living   2 2                 Patient Active Problem List   Diagnosis Date Noted  . Stress incontinence 01/05/2015  . Arthritis 01/05/2015  . Diarrhea, ? IBS, could be microscopic colitis 09/25/2013  . Disruption of suture line 11/09/2011  . Internal hemorrhoids with bleeding, prolapse, itching 11/09/2011  . General medical examination 09/17/2011  . Neoplasm of uncertain behavior of skin 08/27/2011  . Anaphylaxis 02/14/2011  . GERD 09/18/2010  . HYPERLIPIDEMIA 08/20/2008  . DEPRESSIVE DISORDER 08/20/2008  . HYPERTENSION, BENIGN ESSENTIAL 08/20/2008    Past Medical History  Diagnosis Date  . Hypertension   . Hyperlipidemia   . Migraine   . Depression     Dr. Abner Greenspan  . Anaphylaxis   . Anxiety   . Arthritis   . Sleep apnea     ??  . Rosacea   . Cough   . Internal and  external bleeding hemorrhoids     Past Surgical History  Procedure Laterality Date  . Tonsillectomy  1962  . Total shoulder replacement  2009    right  . Colonoscopy  09/15/2008    internal and external hemorroids  . Cosmetic surgery      multiple  . Appendectomy  2004  . Cesarean section  1982, 1985    x 2  . Hemorrhoid banding  2014    Current Outpatient Prescriptions  Medication Sig Dispense Refill  . buPROPion (WELLBUTRIN XL) 300 MG 24 hr tablet Take 300 mg by mouth daily.     . cetirizine (ZYRTEC) 10 MG tablet Take 10 mg by mouth daily as needed.      . conjugated estrogens (PREMARIN) vaginal cream Place vaginally 2 (two) times a week. Place 1/2 gram vaginally twice a week at bedtime. 42.5 g 2  . eletriptan (RELPAX) 20 MG tablet Take 1 tablet (20 mg total) by mouth as needed. may repeat in 2 hours if necessary 10 tablet 2  . FLUoxetine (PROZAC) 40 MG capsule Take 40 mg by mouth daily.     Marland Kitchen EPIPEN 2-PAK 0.3 MG/0.3ML SOAJ injection INJECT 0.3 MLS (0.3 MG TOTAL) INTO THE MUSCLE ONCE. (Patient not taking: Reported on 01/17/2015) 2 Device  1  . hydrocortisone (PROCTOZONE-HC) 2.5 % rectal cream Place 1 application rectally 2 (two) times daily. (Patient not taking: Reported on 01/17/2015) 30 g 0   No current facility-administered medications for this visit.     ALLERGIES: Ace inhibitors  Family History  Problem Relation Age of Onset  . Hyperlipidemia Mother   . Heart disease Mother   . Diabetes Father   . Hypertension Father   . Heart attack Father     84s  . Heart disease Father   . Obesity Sister   . Kidney disease Sister   . Colon cancer Neg Hx     History   Social History  . Marital Status: Divorced    Spouse Name: N/A  . Number of Children: N/A  . Years of Education: N/A   Occupational History  . Not on file.   Social History Main Topics  . Smoking status: Never Smoker   . Smokeless tobacco: Never Used  . Alcohol Use: 0.6 oz/week    1 Standard drinks or  equivalent per week     Comment: occasional wine  . Drug Use: No  . Sexual Activity: No   Other Topics Concern  . Not on file   Social History Narrative    ROS:  Pertinent items are noted in HPI.  PHYSICAL EXAMINATION:    BP 140/86 mmHg  Pulse 76  Temp(Src) 98.3 F (36.8 C) (Oral)  Ht 5' 0.75" (1.543 m)  Wt 165 lb (74.844 kg)  BMI 31.44 kg/m2     General appearance: alert, cooperative and appears stated age   Abdomen: Abdominoplasty incision, soft, non-tender; no masses,  no organomegaly No abnormal inguinal nodes palpated  Pelvic: External genitalia:  no lesions              Urethra:  normal appearing urethra with no masses, tenderness or lesions              Bartholins and Skenes: normal                 Vagina: normal appearing vagina with normal color and discharge, no lesions              Cervix: normal appearance                   Bimanual Exam:  Uterus:  uterus is normal size, shape, consistency and nontender                                      Adnexa: normal adnexa in size, nontender and no masses                                      Rectovaginal:  Yes.                                                                Confirms                                      Anus:  normal sphincter tone, no lesions  ASSESSMENT  Mixed incontinence.  Good pelvic support.   PLAN  I have had a comprehensive discussion with the patient regarding prolapse and urinary incontinence.  I have provided reading materials from ACOG regarding incontinence in general as well as medical and surgical treatment for these conditions. Medical treatments may include physical therapy, anticholinergic/antimuscarinic therapy, and dietary modifications - which were reviewed.   We discussed a TVT Exact midurethral sling and cystoscopy.   We discussed success rates of 85 - 90% and  benefits and risks of surgery which include but are not limited to bleeding, infection, damage to surrounding organs,  ureteral damage, vaginal pain with intercourse, mesh erosion and exposure, dyspareunia, urinary retention and need for prolonged catheterization and/or self catheterization, slower voiding, increased urgency symptoms, reoperation, recurrence of incontinence,  DVT, PE, death, and reaction to anesthesia.      Patient wishes to avoid surgery at this time.  If she wishes to proceed with surgery, she will need multi channel urodynamic testing.  Procedure explained.    An After Visit Summary was printed and given to the patient.  ___25___ minutes face to face time of which over 50% was spent in counseling.

## 2015-02-02 ENCOUNTER — Encounter: Payer: Self-pay | Admitting: Physician Assistant

## 2015-02-02 ENCOUNTER — Ambulatory Visit (INDEPENDENT_AMBULATORY_CARE_PROVIDER_SITE_OTHER): Payer: BLUE CROSS/BLUE SHIELD | Admitting: Physician Assistant

## 2015-02-02 VITALS — BP 124/77 | HR 86 | Temp 98.3°F | Resp 16 | Ht 60.75 in | Wt 164.0 lb

## 2015-02-02 DIAGNOSIS — M79671 Pain in right foot: Secondary | ICD-10-CM | POA: Diagnosis not present

## 2015-02-02 MED ORDER — MELOXICAM 7.5 MG PO TABS: 7.5000 mg | ORAL_TABLET | Freq: Every day | ORAL | Status: DC

## 2015-02-02 NOTE — Progress Notes (Signed)
Pre visit review using our clinic review tool, if applicable. No additional management support is needed unless otherwise documented below in the visit note/SLS  

## 2015-02-02 NOTE — Patient Instructions (Signed)
Please take the Mobic daily with food over the next 7-10 days. Keep ACE wrap around foot during the day.  Compression will help with pain. Listen to your body -- if something is hurting, then ease off some with yoga and other activities. Always wear supportive foot wear.  If symptoms are not improving, we will need to get an x-ray to further assess.

## 2015-02-02 NOTE — Progress Notes (Signed)
Patient presents to clinic today c/o pain in R mid foot during yoga first noticed 6 weeks ago. Denies trauma or injury. Denies numbness or tingling.  Has significant history of OA in multiple joints. Has not taken anything for symptoms.  Past Medical History  Diagnosis Date  . Hypertension   . Hyperlipidemia   . Migraine   . Depression     Dr. Abner Greenspan  . Anaphylaxis   . Anxiety   . Arthritis   . Sleep apnea     ??  . Rosacea   . Cough   . Internal and external bleeding hemorrhoids     Current Outpatient Prescriptions on File Prior to Visit  Medication Sig Dispense Refill  . buPROPion (WELLBUTRIN XL) 300 MG 24 hr tablet Take 300 mg by mouth daily.     . cetirizine (ZYRTEC) 10 MG tablet Take 10 mg by mouth daily as needed.      . eletriptan (RELPAX) 20 MG tablet Take 1 tablet (20 mg total) by mouth as needed. may repeat in 2 hours if necessary 10 tablet 2  . EPIPEN 2-PAK 0.3 MG/0.3ML SOAJ injection INJECT 0.3 MLS (0.3 MG TOTAL) INTO THE MUSCLE ONCE. 2 Device 1  . FLUoxetine (PROZAC) 40 MG capsule Take 40 mg by mouth daily.     . hydrocortisone (PROCTOZONE-HC) 2.5 % rectal cream Place 1 application rectally 2 (two) times daily. 30 g 0  . conjugated estrogens (PREMARIN) vaginal cream Place vaginally 2 (two) times a week. Place 1/2 gram vaginally twice a week at bedtime. (Patient not taking: Reported on 02/02/2015) 42.5 g 2   No current facility-administered medications on file prior to visit.    Allergies  Allergen Reactions  . Ace Inhibitors     REACTION: cough    Family History  Problem Relation Age of Onset  . Hyperlipidemia Mother   . Heart disease Mother   . Diabetes Father   . Hypertension Father   . Heart attack Father     2s  . Heart disease Father   . Obesity Sister   . Kidney disease Sister   . Colon cancer Neg Hx     History   Social History  . Marital Status: Divorced    Spouse Name: N/A  . Number of Children: N/A  . Years of Education: N/A    Social History Main Topics  . Smoking status: Never Smoker   . Smokeless tobacco: Never Used  . Alcohol Use: 0.6 oz/week    1 Standard drinks or equivalent per week     Comment: occasional wine  . Drug Use: No  . Sexual Activity: No   Other Topics Concern  . None   Social History Narrative    Review of Systems - See HPI.  All other ROS are negative.  BP 124/77 mmHg  Pulse 86  Temp(Src) 98.3 F (36.8 C) (Oral)  Resp 16  Ht 5' 0.75" (1.543 m)  Wt 164 lb (74.39 kg)  BMI 31.25 kg/m2  SpO2 98%  Physical Exam  Constitutional: She is oriented to person, place, and time and well-developed, well-nourished, and in no distress.  HENT:  Head: Normocephalic and atraumatic.  Cardiovascular: Normal rate, regular rhythm, normal heart sounds and intact distal pulses.   Pulmonary/Chest: Effort normal and breath sounds normal. No respiratory distress. She has no wheezes. She has no rales. She exhibits no tenderness.  Musculoskeletal:       Right foot: There is tenderness. There is normal range  of motion, no bony tenderness and no crepitus.  Neurological: She is alert and oriented to person, place, and time.  Skin: Skin is warm and dry. No rash noted.  Vitals reviewed.   Recent Results (from the past 2160 hour(s))  Hemoglobin, fingerstick     Status: None   Collection Time: 01/05/15  9:22 AM  Result Value Ref Range   Hemoglobin, fingerstick 12.8 12.0 - 16.0 g/dL  POCT Urinalysis Dipstick     Status: None   Collection Time: 01/05/15  9:35 AM  Result Value Ref Range   Color, UA     Clarity, UA     Glucose, UA n    Bilirubin, UA n    Ketones, UA n    Spec Grav, UA     Blood, UA n    pH, UA 5.0    Protein, UA n    Urobilinogen, UA negative    Nitrite, UA n    Leukocytes, UA Negative   Comprehensive metabolic panel     Status: Abnormal   Collection Time: 01/05/15 11:09 AM  Result Value Ref Range   Sodium 138 135 - 145 mEq/L   Potassium 4.8 3.5 - 5.3 mEq/L   Chloride 103 96  - 112 mEq/L   CO2 24 19 - 32 mEq/L   Glucose, Bld 97 70 - 99 mg/dL   BUN 26 (H) 6 - 23 mg/dL   Creat 0.89 0.50 - 1.10 mg/dL   Total Bilirubin 0.3 0.2 - 1.2 mg/dL   Alkaline Phosphatase 74 39 - 117 U/L   AST 19 0 - 37 U/L   ALT 28 0 - 35 U/L   Total Protein 6.5 6.0 - 8.3 g/dL   Albumin 4.1 3.5 - 5.2 g/dL   Calcium 9.3 8.4 - 10.5 mg/dL  TSH     Status: None   Collection Time: 01/05/15 11:09 AM  Result Value Ref Range   TSH 3.164 0.350 - 4.500 uIU/mL  Vit D  25 hydroxy (rtn osteoporosis monitoring)     Status: Abnormal   Collection Time: 01/05/15 11:09 AM  Result Value Ref Range   Vit D, 25-Hydroxy 28 (L) 30 - 100 ng/mL    Comment: Vitamin D Status           25-OH Vitamin D        Deficiency                <20 ng/mL        Insufficiency         20 - 29 ng/mL        Optimal             > or = 30 ng/mL   For 25-OH Vitamin D testing on patients on D2-supplementation and patients for whom quantitation of D2 and D3 fractions is required, the QuestAssureD 25-OH VIT D, (D2,D3), LC/MS/MS is recommended: order code [phone] (patients > 2 yrs).   Lipid panel     Status: Abnormal   Collection Time: 01/05/15 11:09 AM  Result Value Ref Range   Cholesterol 213 (H) 0 - 200 mg/dL    Comment: ATP III Classification:       < 200        mg/dL        Desirable      200 - 239     mg/dL        Borderline High      >= 240  mg/dL        High      Triglycerides 152 (H) <150 mg/dL   HDL 51 >=46 mg/dL    Comment: ** Please note change in reference range(s). **   Total CHOL/HDL Ratio 4.2 Ratio   VLDL 30 0 - 40 mg/dL   LDL Cholesterol 132 (H) 0 - 99 mg/dL    Comment:   Total Cholesterol/HDL Ratio:CHD Risk                        Coronary Heart Disease Risk Table                                        Men       Women          1/2 Average Risk              3.4        3.3              Average Risk              5.0        4.4           2X Average Risk              9.6        7.1           3X  Average Risk             23.4       11.0 Use the calculated Patient Ratio above and the CHD Risk table  to determine the patient's CHD Risk. ATP III Classification (LDL):       < 100        mg/dL         Optimal      100 - 129     mg/dL         Near or Above Optimal      130 - 159     mg/dL         Borderline High      160 - 189     mg/dL         High       > 190        mg/dL         Very High     PAP with Reflex to HPV (IPS)     Status: None   Collection Time: 01/05/15 11:09 AM  Result Value Ref Range   COMMENTS: [facility]     Comment: [address], [place] [address] [place] [address] GYN CYTOLOGY REPORT  PATIENT NAME:Taussig, Amenda PATHOLOGY#:[id number]: F DOB: [birth year] (Age: 69) MEDICAL RECORD WLNLGX:[id number] DOCTOR:Suzanne Sabra Heck, M.D. DATE OBTAINED:3/23/2016CLIENT:[facility] RECEIVED:3/24/2016OTHER PHYS: DATE SIGNED:01/06/2015 PAP Thinlayer with Reflex to HPV Final Cytologic Interpretation:       Cervical, ThinLayer with Automated Imaging and Dual Review, CPT 88175      Negative for Intraepithelial Lesions or Malignancy.       ADEQUACY OF SPECIMEN:           Satisfactory for evaluation. No Endocervical cells/transformation zone component identified.             NOTE: This Pap test has been evaluated with computer assisted technology.  Electronically signed by: Rejeana Brock, CT(ASCP), [address] [place] [address] (Med. Dir.: Darren Wirthwein) cem/3/24/2016The Pap test is a screening mechanism with excellent but not perfect ability to preve nt cervical carcinoma.  It has a low, but  significant, diagnostic error rate. The pap test is suboptimal  for detection of glandular lesions.  It should be noted that a negative result does not definitively rule out the presence of disease.Ref: DeMay, RM, The Art and Science of Cytopathology,  [employer], [phone]. Last Menstrual Period: 10/15/1998 Menstrual/Pregnancy  History: Post-menopausal     Sed Rate (ESR)     Status: None   Collection Time: 01/05/15 11:09 AM  Result Value Ref Range   Sed Rate 1 0 - 30 mm/hr    Assessment/Plan: Right foot pain Suspect OA.  Will attempt trial of conservative measures before imaging per patient request. Rx Mobic daily. RICE discussed.  Will proceed with x-ray if not improving.

## 2015-02-02 NOTE — Assessment & Plan Note (Signed)
Suspect OA.  Will attempt trial of conservative measures before imaging per patient request. Rx Mobic daily. RICE discussed.  Will proceed with x-ray if not improving.

## 2015-03-16 DIAGNOSIS — M25561 Pain in right knee: Secondary | ICD-10-CM | POA: Diagnosis not present

## 2015-03-22 DIAGNOSIS — L821 Other seborrheic keratosis: Secondary | ICD-10-CM | POA: Diagnosis not present

## 2015-03-22 DIAGNOSIS — Z85828 Personal history of other malignant neoplasm of skin: Secondary | ICD-10-CM | POA: Diagnosis not present

## 2015-03-22 DIAGNOSIS — L570 Actinic keratosis: Secondary | ICD-10-CM | POA: Diagnosis not present

## 2015-03-22 DIAGNOSIS — L738 Other specified follicular disorders: Secondary | ICD-10-CM | POA: Diagnosis not present

## 2015-03-22 DIAGNOSIS — L814 Other melanin hyperpigmentation: Secondary | ICD-10-CM | POA: Diagnosis not present

## 2015-04-27 DIAGNOSIS — M5136 Other intervertebral disc degeneration, lumbar region: Secondary | ICD-10-CM | POA: Diagnosis not present

## 2015-04-27 DIAGNOSIS — M1711 Unilateral primary osteoarthritis, right knee: Secondary | ICD-10-CM | POA: Diagnosis not present

## 2015-04-27 DIAGNOSIS — M545 Low back pain: Secondary | ICD-10-CM | POA: Diagnosis not present

## 2015-04-27 DIAGNOSIS — M25561 Pain in right knee: Secondary | ICD-10-CM | POA: Diagnosis not present

## 2015-05-05 DIAGNOSIS — M545 Low back pain: Secondary | ICD-10-CM | POA: Diagnosis not present

## 2015-05-12 DIAGNOSIS — M545 Low back pain: Secondary | ICD-10-CM | POA: Diagnosis not present

## 2015-05-30 DIAGNOSIS — M25561 Pain in right knee: Secondary | ICD-10-CM | POA: Diagnosis not present

## 2015-05-30 DIAGNOSIS — M1711 Unilateral primary osteoarthritis, right knee: Secondary | ICD-10-CM | POA: Diagnosis not present

## 2015-06-06 DIAGNOSIS — M1711 Unilateral primary osteoarthritis, right knee: Secondary | ICD-10-CM | POA: Diagnosis not present

## 2015-06-14 DIAGNOSIS — M1711 Unilateral primary osteoarthritis, right knee: Secondary | ICD-10-CM | POA: Diagnosis not present

## 2015-06-14 DIAGNOSIS — M25561 Pain in right knee: Secondary | ICD-10-CM | POA: Diagnosis not present

## 2015-08-10 DIAGNOSIS — M1711 Unilateral primary osteoarthritis, right knee: Secondary | ICD-10-CM | POA: Diagnosis not present

## 2015-08-10 DIAGNOSIS — M25561 Pain in right knee: Secondary | ICD-10-CM | POA: Diagnosis not present

## 2015-08-16 ENCOUNTER — Other Ambulatory Visit: Payer: Self-pay

## 2015-08-16 DIAGNOSIS — Z1231 Encounter for screening mammogram for malignant neoplasm of breast: Secondary | ICD-10-CM

## 2015-08-26 DIAGNOSIS — M1711 Unilateral primary osteoarthritis, right knee: Secondary | ICD-10-CM | POA: Diagnosis not present

## 2015-08-26 DIAGNOSIS — M25561 Pain in right knee: Secondary | ICD-10-CM | POA: Diagnosis not present

## 2015-09-26 ENCOUNTER — Ambulatory Visit
Admission: RE | Admit: 2015-09-26 | Discharge: 2015-09-26 | Disposition: A | Discharge status: Home / Self Care | Payer: Medicare Other | Source: Ambulatory Visit

## 2015-09-26 DIAGNOSIS — Z1231 Encounter for screening mammogram for malignant neoplasm of breast: Secondary | ICD-10-CM | POA: Diagnosis not present

## 2015-09-30 DIAGNOSIS — M1711 Unilateral primary osteoarthritis, right knee: Secondary | ICD-10-CM | POA: Diagnosis not present

## 2015-10-27 ENCOUNTER — Encounter: Payer: BLUE CROSS/BLUE SHIELD | Admitting: Internal Medicine

## 2015-11-09 ENCOUNTER — Ambulatory Visit (INDEPENDENT_AMBULATORY_CARE_PROVIDER_SITE_OTHER): Payer: Medicare Other | Admitting: Family Medicine

## 2015-11-09 ENCOUNTER — Encounter: Payer: Self-pay | Admitting: Family Medicine

## 2015-11-09 ENCOUNTER — Other Ambulatory Visit: Payer: Self-pay

## 2015-11-09 ENCOUNTER — Telehealth: Payer: Self-pay | Admitting: Family Medicine

## 2015-11-09 VITALS — BP 140/92 | HR 85 | Temp 98.2°F | Ht 61.0 in | Wt 160.8 lb

## 2015-11-09 DIAGNOSIS — E785 Hyperlipidemia, unspecified: Secondary | ICD-10-CM | POA: Diagnosis not present

## 2015-11-09 DIAGNOSIS — Z01818 Encounter for other preprocedural examination: Secondary | ICD-10-CM | POA: Diagnosis not present

## 2015-11-09 DIAGNOSIS — K644 Residual hemorrhoidal skin tags: Secondary | ICD-10-CM

## 2015-11-09 LAB — LIPID PANEL
CHOL/HDL RATIO: 4
Cholesterol: 229 mg/dL — ABNORMAL HIGH (ref 0–200)
HDL: 57.1 mg/dL (ref >39.00)
NONHDL: 172.15
Triglycerides: 224 mg/dL — ABNORMAL HIGH (ref 0.0–149.0)
VLDL: 44.8 mg/dL — ABNORMAL HIGH (ref 0.0–40.0)

## 2015-11-09 LAB — LDL CHOLESTEROL, DIRECT: LDL DIRECT: 146 mg/dL

## 2015-11-09 LAB — HEPATIC FUNCTION PANEL
ALK PHOS: 71 U/L (ref 39–117)
ALT: 27 U/L (ref 0–35)
AST: 21 U/L (ref 0–37)
Albumin: 4.1 g/dL (ref 3.5–5.2)
BILIRUBIN TOTAL: 0.4 mg/dL (ref 0.2–1.2)
Bilirubin, Direct: 0.1 mg/dL (ref 0.0–0.3)
Total Protein: 6.9 g/dL (ref 6.0–8.3)

## 2015-11-09 LAB — CBC WITH DIFFERENTIAL/PLATELET
BASOS PCT: 0.5 % (ref 0.0–3.0)
Basophils Absolute: 0 10*3/uL (ref 0.0–0.1)
EOS PCT: 2.8 % (ref 0.0–5.0)
Eosinophils Absolute: 0.2 10*3/uL (ref 0.0–0.7)
HCT: 40.7 % (ref 36.0–46.0)
HEMOGLOBIN: 13.4 g/dL (ref 12.0–15.0)
Lymphocytes Relative: 34.4 % (ref 12.0–46.0)
Lymphs Abs: 2.8 10*3/uL (ref 0.7–4.0)
MCHC: 32.9 g/dL (ref 30.0–36.0)
MCV: 87 fl (ref 78.0–100.0)
MONO ABS: 0.7 10*3/uL (ref 0.1–1.0)
Monocytes Relative: 8.2 % (ref 3.0–12.0)
Neutro Abs: 4.3 10*3/uL (ref 1.4–7.7)
Neutrophils Relative %: 54.1 % (ref 43.0–77.0)
Platelets: 267 10*3/uL (ref 150.0–400.0)
RBC: 4.68 Mil/uL (ref 3.87–5.11)
RDW: 14.6 % (ref 11.5–15.5)
WBC: 8 10*3/uL (ref 4.0–10.5)

## 2015-11-09 LAB — TSH: TSH: 1.87 u[IU]/mL (ref 0.35–4.50)

## 2015-11-09 LAB — BASIC METABOLIC PANEL
BUN: 23 mg/dL (ref 6–23)
CHLORIDE: 104 meq/L (ref 96–112)
CO2: 27 mEq/L (ref 19–32)
Calcium: 9.3 mg/dL (ref 8.4–10.5)
Creatinine, Ser: 0.95 mg/dL (ref 0.40–1.20)
GFR: 62.28 mL/min (ref >60.00)
Glucose, Bld: 104 mg/dL — ABNORMAL HIGH (ref 70–99)
POTASSIUM: 4.1 meq/L (ref 3.5–5.1)
Sodium: 138 mEq/L (ref 135–145)

## 2015-11-09 MED ORDER — HYDROCORTISONE 2.5 % RE CREA: 1.0000 "application " | TOPICAL_CREAM | Freq: Two times a day (BID) | RECTAL | Status: DC

## 2015-11-09 MED ORDER — EPINEPHRINE 0.3 MG/0.3ML IJ SOAJ: INTRAMUSCULAR | Status: DC

## 2015-11-09 MED ORDER — ELETRIPTAN HYDROBROMIDE 20 MG PO TABS: 20.0000 mg | ORAL_TABLET | ORAL | Status: DC | PRN

## 2015-11-09 NOTE — Patient Instructions (Signed)
Schedule your complete physical in 6 months We'll notify you of your lab results and make any changes if needed Keep up the good work on healthy diet and regular exercise Call with any questions or concerns If you want to join [place] at the new [place] office, any scheduled appointments will automatically transfer and we will see you at [address] [place] [address] [place], [place], [place] [address] ([place]) [place]!!!!

## 2015-11-09 NOTE — Progress Notes (Signed)
Subjective:    Cassandra Little is a 70 y.o. female who presents to the office today for a preoperative consultation at the request of surgeon Dr Alvan Dame who plans on performing R TKA on March 13. This consultation is requested for the specific conditions prompting preoperative evaluation (i.e. because of potential affect on operative risk): elevated BP. Planned anesthesia: epidural. The patient has the following known anesthesia issues: past general anesthesia with complications (nausea, vomiting, confusion). Patients bleeding risk: no recent abnormal bleeding and no remote history of abnormal bleeding. Patient does not have objections to receiving blood products if needed.  The following portions of the patient's history were reviewed and updated as appropriate: allergies, current medications, past family history, past medical history, past social history, past surgical history and problem list.  Review of Systems Pertinent items noted in HPI and remainder of comprehensive ROS otherwise negative.    Objective:    BP 140/92 mmHg  Pulse 85  Temp(Src) 98.2 F (36.8 C) (Oral)  Ht 5\' 1"  (1.549 m)  Wt 160 lb 12.8 oz (72.938 kg)  BMI 30.40 kg/m2  SpO2 98%  General Appearance:    Alert, cooperative, no distress, appears stated age  Head:    Normocephalic, without obvious abnormality, atraumatic  Eyes:    PERRL, conjunctiva/corneas clear, EOM's intact, fundi    benign, both eyes  Ears:    Normal TM's and external ear canals, both ears  Nose:   Nares normal, septum midline, mucosa normal, no drainage    or sinus tenderness  Throat:   Lips, mucosa, and tongue normal; teeth and gums normal  Neck:   Supple, symmetrical, trachea midline, no adenopathy;    thyroid:  no enlargement/tenderness/nodules  Back:     Symmetric, no curvature, ROM normal, no CVA tenderness  Lungs:     Clear to auscultation bilaterally, respirations unlabored  Chest Wall:    No tenderness or deformity   Heart:    Regular rate  and rhythm, S1 and S2 normal, no murmur, rub   or gallop  Breast Exam:    No tenderness, masses, or nipple abnormality  Abdomen:     Soft, non-tender, bowel sounds active all four quadrants,    no masses, no organomegaly  Genitalia:    Deferred to gyn  Rectal:    Extremities:   Extremities normal, atraumatic, no cyanosis or edema  Pulses:   2+ and symmetric all extremities  Skin:   Skin color, texture, turgor normal, no rashes or lesions  Lymph nodes:   Cervical, supraclavicular, and axillary nodes normal  Neurologic:   CNII-XII intact, normal strength, sensation and reflexes    throughout    Predictors of intubation difficulty:  Morbid obesity? no  Anatomically abnormal facies? no  Prominent incisors? no  Receding mandible? no  Short, thick neck? no  Neck range of motion: normal  Dentition: No chipped, loose, or missing teeth.  Cardiographics ECG: normal sinus rhythm, no blocks or conduction defects, no ischemic changes Echocardiogram: not done  Imaging Chest x-ray: NA   Lab Review  No visits with results within 6 Month(s) from this visit. Latest known visit with results is:  Office Visit on 01/05/2015  Component Date Value  . Glucose, UA 01/05/2015 n   . Bilirubin, UA 01/05/2015 n   . Ketones, UA 01/05/2015 n   . Blood, UA 01/05/2015 n   . pH, UA 01/05/2015 5.0   . Protein, UA 01/05/2015 n   . Urobilinogen, UA 01/05/2015 negative   .  Nitrite, UA 01/05/2015 n   . Leukocytes, UA 01/05/2015 Negative   . Hemoglobin, fingerstick 01/05/2015 12.8   . Sodium 01/05/2015 138   . Potassium 01/05/2015 4.8   . Chloride 01/05/2015 103   . CO2 01/05/2015 24   . Glucose, Bld 01/05/2015 97   . BUN 01/05/2015 26*  . Creat 01/05/2015 0.89   . Total Bilirubin 01/05/2015 0.3   . Alkaline Phosphatase 01/05/2015 74   . AST 01/05/2015 19   . ALT 01/05/2015 28   . Total Protein 01/05/2015 6.5   . Albumin 01/05/2015 4.1   . Calcium 01/05/2015 9.3   . TSH 01/05/2015 3.164   . Vit  D, 25-Hydroxy 01/05/2015 28*  . Cholesterol 01/05/2015 213*  . Triglycerides 01/05/2015 152*  . HDL 01/05/2015 51   . Total CHOL/HDL Ratio 01/05/2015 4.2   . VLDL 01/05/2015 30   . LDL Cholesterol 01/05/2015 132*  . COMMENTS: 01/05/2015 [facility]   . Sed Rate 01/05/2015 1       Assessment:      70 y.o. female with planned surgery as above.   Known risk factors for perioperative complications: None   Difficulty with intubation is not anticipated.  Cardiac Risk Estimation: low  Current medications which may produce withdrawal symptoms if withheld perioperatively: none      Plan:    1. Preoperative workup as follows hemoglobin, hematocrit, electrolytes, creatinine, glucose, liver function studies. 2. Change in medication regimen before surgery: discontinue NSAIDs (Mobic) 14 days before surgery. 3. Prophylaxis for cardiac events with perioperative beta-blockers: not indicated. 4. Invasive hemodynamic monitoring perioperatively: at the discretion of anesthesiologist. 5. Deep vein thrombosis prophylaxis postoperatively:regimen to be chosen by surgical team. 6. Surveillance for postoperative MI with ECG immediately postoperatively and on postoperative days 1 and 2 AND troponin levels 24 hours postoperatively and on day 4 or hospital discharge (whichever comes first): at the discretion of anesthesiologist. 7. Other measures: none

## 2015-11-09 NOTE — Telephone Encounter (Signed)
Patient states she would like medication refill to go to [facility] [address] [place], [place] [address] [phone]

## 2015-11-09 NOTE — Telephone Encounter (Signed)
Medications refilled

## 2015-11-09 NOTE — Telephone Encounter (Deleted)
Medications refilled

## 2015-11-09 NOTE — Assessment & Plan Note (Signed)
Pt was previously on a statin.  Stopped medication and indicates that she is not interested in restarting.  Check labs.

## 2015-11-09 NOTE — Telephone Encounter (Signed)
Relation to [id number] Call back [phone] Pharmacy: [id number] - [place], [place] [phone] (Phone) [phone] (Fax)         Reason for call:  EPIPEN 2-PAK 0.3 MG/0.3ML SOAJ injection, eletriptan (RELPAX) 20 MG tablet and hydrocortisone (PROCTOZONE-HC) 2.5 % rectal cream

## 2015-11-09 NOTE — Progress Notes (Signed)
Pre visit review using our clinic review tool, if applicable. No additional management support is needed unless otherwise documented below in the visit note. 

## 2015-11-14 DIAGNOSIS — M25561 Pain in right knee: Secondary | ICD-10-CM | POA: Diagnosis not present

## 2015-11-14 DIAGNOSIS — M1711 Unilateral primary osteoarthritis, right knee: Secondary | ICD-10-CM | POA: Diagnosis not present

## 2015-11-25 DIAGNOSIS — M1711 Unilateral primary osteoarthritis, right knee: Secondary | ICD-10-CM | POA: Diagnosis not present

## 2015-12-08 NOTE — Patient Instructions (Addendum)
Cassandra Little  12/08/2015   Your procedure is scheduled on: December 26, 2015  Report to [facility] Main  Entrance take [facility]  elevators to 3rd floor to  [place] at 5:30 AM.  Call this number if you have problems the morning of surgery [phone]   Remember: ONLY 1 PERSON MAY GO WITH YOU TO SHORT STAY TO GET  READY MORNING OF [place].  Do not eat food or drink liquids :After Midnight.     Take these medicines the morning of surgery with A SIP OF WATER:, Prozac (fluoxetine), Nexium, Wellbutrin (Bupropion) DO NOT TAKE ANY DIABETIC MEDICATIONS DAY OF YOUR SURGERY                               You may not have any metal on your body including hair pins and              piercings  Do not wear jewelry, make-up, lotions, powders or perfumes, deodorant             Do not wear nail polish.  Do not shave  48 hours prior to surgery.               Do not bring valuables to the hospital. [place].  Contacts, dentures or bridgework may not be worn into surgery.  Leave suitcase in the car. After surgery it may be brought to your room.     Special Instructions: coughing and deep breathing exercises, leg exercises              Please read over the following fact sheets you were given: _____________________________________________________________________             [facility] - Preparing for Surgery Before surgery, you can play an important role.  Because skin is not sterile, your skin needs to be as free of germs as possible.  You can reduce the number of germs on your skin by washing with CHG (chlorahexidine gluconate) soap before surgery.  CHG is an antiseptic cleaner which kills germs and bonds with the skin to continue killing germs even after washing. Please DO NOT use if you have an allergy to CHG or antibacterial soaps.  If your skin becomes reddened/irritated stop using the CHG and inform your nurse  when you arrive at Short Stay. Do not shave (including legs and underarms) for at least 48 hours prior to the first CHG shower.  You may shave your face/neck. Please follow these instructions carefully:  1.  Shower with CHG Soap the night before surgery and the  morning of Surgery.  2.  If you choose to wash your hair, wash your hair first as usual with your  normal  shampoo.  3.  After you shampoo, rinse your hair and body thoroughly to remove the  shampoo.                           4.  Use CHG as you would any other liquid soap.  You can apply chg directly  to the skin and wash  Gently with a scrungie or clean washcloth.  5.  Apply the CHG Soap to your body ONLY FROM THE NECK DOWN.   Do not use on face/ open                           Wound or open sores. Avoid contact with eyes, ears mouth and genitals (private parts).                       Wash face,  Genitals (private parts) with your normal soap.             6.  Wash thoroughly, paying special attention to the area where your surgery  will be performed.  7.  Thoroughly rinse your body with warm water from the neck down.  8.  DO NOT shower/wash with your normal soap after using and rinsing off  the CHG Soap.                9.  Pat yourself dry with a clean towel.            10.  Wear clean pajamas.            11.  Place clean sheets on your bed the night of your first shower and do not  sleep with pets. Day of Surgery : Do not apply any lotions/deodorants the morning of surgery.  Please wear clean clothes to the hospital/surgery center.  FAILURE TO FOLLOW THESE INSTRUCTIONS MAY RESULT IN THE CANCELLATION OF YOUR SURGERY PATIENT SIGNATURE_________________________________  NURSE SIGNATURE__________________________________  ________________________________________________________________________  WHAT IS A BLOOD TRANSFUSION? Blood Transfusion Information  A transfusion is the replacement of blood or some of its  parts. Blood is made up of multiple cells which provide different functions.  Red blood cells carry oxygen and are used for blood loss replacement.  White blood cells fight against infection.  Platelets control bleeding.  Plasma helps clot blood.  Other blood products are available for specialized needs, such as hemophilia or other clotting disorders. BEFORE THE TRANSFUSION  Who gives blood for transfusions?   Healthy volunteers who are fully evaluated to make sure their blood is safe. This is blood bank blood. Transfusion therapy is the safest it has ever been in the practice of medicine. Before blood is taken from a donor, a complete history is taken to make sure that person has no history of diseases nor engages in risky social behavior (examples are intravenous drug use or sexual activity with multiple partners). The donor's travel history is screened to minimize risk of transmitting infections, such as malaria. The donated blood is tested for signs of infectious diseases, such as HIV and hepatitis. The blood is then tested to be sure it is compatible with you in order to minimize the chance of a transfusion reaction. If you or a relative donates blood, this is often done in anticipation of surgery and is not appropriate for emergency situations. It takes many days to process the donated blood. RISKS AND COMPLICATIONS Although transfusion therapy is very safe and saves many lives, the main dangers of transfusion include:  1. Getting an infectious disease. 2. Developing a transfusion reaction. This is an allergic reaction to something in the blood you were given. Every precaution is taken to prevent this. The decision to have a blood transfusion has been considered carefully by your caregiver before blood is given. Blood is not given unless the benefits outweigh  the risks. AFTER THE TRANSFUSION  Right after receiving a blood transfusion, you will usually feel much better and more energetic.  This is especially true if your red blood cells have gotten low (anemic). The transfusion raises the level of the red blood cells which carry oxygen, and this usually causes an energy increase.  The nurse administering the transfusion will monitor you carefully for complications. HOME CARE INSTRUCTIONS  No special instructions are needed after a transfusion. You may find your energy is better. Speak with your caregiver about any limitations on activity for underlying diseases you may have. SEEK MEDICAL CARE IF:   Your condition is not improving after your transfusion.  You develop redness or irritation at the intravenous (IV) site. SEEK IMMEDIATE MEDICAL CARE IF:  Any of the following symptoms occur over the next 12 hours:  Shaking chills.  You have a temperature by mouth above 102 F (38.9 C), not controlled by medicine.  Chest, back, or muscle pain.  People around you feel you are not acting correctly or are confused.  Shortness of breath or difficulty breathing.  Dizziness and fainting.  You get a rash or develop hives.  You have a decrease in urine output.  Your urine turns a dark color or changes to pink, red, or brown. Any of the following symptoms occur over the next 10 days:  You have a temperature by mouth above 102 F (38.9 C), not controlled by medicine.  Shortness of breath.  Weakness after normal activity.  The white part of the eye turns yellow (jaundice).  You have a decrease in the amount of urine or are urinating less often.  Your urine turns a dark color or changes to pink, red, or brown. Document Released: 09/28/2000 Document Revised: 12/24/2011 Document Reviewed: 05/17/2008 ExitCare Patient Information 2014 [place].  _______________________________________________________________________  Incentive Spirometer  An incentive spirometer is a tool that can help keep your lungs clear and active. This tool measures how well you are filling  your lungs with each breath. Taking long deep breaths may help reverse or decrease the chance of developing breathing (pulmonary) problems (especially infection) following:  A long period of time when you are unable to move or be active. BEFORE THE PROCEDURE   If the spirometer includes an indicator to show your best effort, your nurse or respiratory therapist will set it to a desired goal.  If possible, sit up straight or lean slightly forward. Try not to slouch.  Hold the incentive spirometer in an upright position. INSTRUCTIONS FOR USE  3. Sit on the edge of your bed if possible, or sit up as far as you can in bed or on a chair. 4. Hold the incentive spirometer in an upright position. 5. Breathe out normally. 6. Place the mouthpiece in your mouth and seal your lips tightly around it. 7. Breathe in slowly and as deeply as possible, raising the piston or the ball toward the top of the column. 8. Hold your breath for 3-5 seconds or for as long as possible. Allow the piston or ball to fall to the bottom of the column. 9. Remove the mouthpiece from your mouth and breathe out normally. 10. Rest for a few seconds and repeat Steps 1 through 7 at least 10 times every 1-2 hours when you are awake. Take your time and take a few normal breaths between deep breaths. 11. The spirometer may include an indicator to show your best effort. Use the indicator as a goal to work  toward during each repetition. 12. After each set of 10 deep breaths, practice coughing to be sure your lungs are clear. If you have an incision (the cut made at the time of surgery), support your incision when coughing by placing a pillow or rolled up towels firmly against it. Once you are able to get out of bed, walk around indoors and cough well. You may stop using the incentive spirometer when instructed by your caregiver.  RISKS AND COMPLICATIONS  Take your time so you do not get dizzy or light-headed.  If you are in pain, you  may need to take or ask for pain medication before doing incentive spirometry. It is harder to take a deep breath if you are having pain. AFTER USE  Rest and breathe slowly and easily.  It can be helpful to keep track of a log of your progress. Your caregiver can provide you with a simple table to help with this. If you are using the spirometer at home, follow these instructions: [place] IF:   You are having difficultly using the spirometer.  You have trouble using the spirometer as often as instructed.  Your pain medication is not giving enough relief while using the spirometer.  You develop fever of 100.5 F (38.1 C) or higher. SEEK IMMEDIATE MEDICAL CARE IF:   You cough up bloody sputum that had not been present before.  You develop fever of 102 F (38.9 C) or greater.  You develop worsening pain at or near the incision site. MAKE SURE YOU:   Understand these instructions.  Will watch your condition.  Will get help right away if you are not doing well or get worse. Document Released: 02/11/2007 Document Revised: 12/24/2011 Document Reviewed: 04/14/2007 [facility] Patient Information 2014 [place], [place].   ________________________________________________________________________

## 2015-12-12 ENCOUNTER — Encounter (HOSPITAL_COMMUNITY): Payer: Self-pay

## 2015-12-12 ENCOUNTER — Encounter (HOSPITAL_COMMUNITY)
Admission: RE | Admit: 2015-12-12 | Discharge: 2015-12-12 | Disposition: A | Discharge status: Home / Self Care | Payer: Medicare Other | Source: Ambulatory Visit | Attending: Orthopedic Surgery | Admitting: Orthopedic Surgery

## 2015-12-12 DIAGNOSIS — Z01812 Encounter for preprocedural laboratory examination: Secondary | ICD-10-CM | POA: Insufficient documentation

## 2015-12-12 DIAGNOSIS — I10 Essential (primary) hypertension: Secondary | ICD-10-CM | POA: Diagnosis not present

## 2015-12-12 DIAGNOSIS — K219 Gastro-esophageal reflux disease without esophagitis: Secondary | ICD-10-CM | POA: Diagnosis not present

## 2015-12-12 DIAGNOSIS — E785 Hyperlipidemia, unspecified: Secondary | ICD-10-CM | POA: Diagnosis not present

## 2015-12-12 DIAGNOSIS — M1711 Unilateral primary osteoarthritis, right knee: Secondary | ICD-10-CM | POA: Insufficient documentation

## 2015-12-12 HISTORY — DX: Nausea with vomiting, unspecified: R11.2

## 2015-12-12 HISTORY — DX: Nausea with vomiting, unspecified: Z98.890

## 2015-12-12 HISTORY — DX: Other specified postprocedural states: R11.2

## 2015-12-12 HISTORY — DX: Gastro-esophageal reflux disease without esophagitis: K21.9

## 2015-12-12 LAB — URINALYSIS, ROUTINE W REFLEX MICROSCOPIC
BILIRUBIN URINE: NEGATIVE
GLUCOSE, UA: NEGATIVE mg/dL
HGB URINE DIPSTICK: NEGATIVE
KETONES UR: NEGATIVE mg/dL
Leukocytes, UA: NEGATIVE
NITRITE: NEGATIVE
PH: 5 (ref 5.0–8.0)
Protein, ur: NEGATIVE mg/dL
SPECIFIC GRAVITY, URINE: 1.017 (ref 1.005–1.030)

## 2015-12-12 LAB — BASIC METABOLIC PANEL
Anion gap: 8 (ref 5–15)
BUN: 22 mg/dL — AB (ref 6–20)
CALCIUM: 9.2 mg/dL (ref 8.9–10.3)
CHLORIDE: 104 mmol/L (ref 101–111)
CO2: 26 mmol/L (ref 22–32)
CREATININE: 0.92 mg/dL (ref 0.44–1.00)
GFR calc non Af Amer: >60 mL/min (ref >60)
GLUCOSE: 84 mg/dL (ref 65–99)
Potassium: 4.4 mmol/L (ref 3.5–5.1)
Sodium: 138 mmol/L (ref 135–145)

## 2015-12-12 LAB — CBC
HCT: 42.2 % (ref 36.0–46.0)
Hemoglobin: 13.8 g/dL (ref 12.0–15.0)
MCH: 29 pg (ref 26.0–34.0)
MCHC: 32.7 g/dL (ref 30.0–36.0)
MCV: 88.7 fL (ref 78.0–100.0)
PLATELETS: 291 10*3/uL (ref 150–400)
RBC: 4.76 MIL/uL (ref 3.87–5.11)
RDW: 14.2 % (ref 11.5–15.5)
WBC: 6 10*3/uL (ref 4.0–10.5)

## 2015-12-12 LAB — PROTIME-INR
INR: 1.08 (ref 0.00–1.49)
PROTHROMBIN TIME: 13.8 s (ref 11.6–15.2)

## 2015-12-12 LAB — SURGICAL PCR SCREEN
MRSA, PCR: NEGATIVE
STAPHYLOCOCCUS AUREUS: NEGATIVE

## 2015-12-12 LAB — APTT: aPTT: 28 seconds (ref 24–37)

## 2015-12-12 NOTE — Progress Notes (Signed)
Surgical Clearance - Dr. Quay Burow - in chart  11-09-15 - EKG - EPIC 11-09-15 - LOV + surgical clearance - Dr. Birdie Riddle (fam.med.) - EPIC

## 2015-12-14 NOTE — H&P (Signed)
TOTAL KNEE ADMISSION H&P  Patient is being admitted for right total knee arthroplasty.  Subjective:  Chief Complaint:    Right knee primary OA / pain  HPI: Cassandra Little, 70 y.o. female, has a history of pain and functional disability in the right knee due to arthritis and has failed non-surgical conservative treatments for greater than 12 weeks to include NSAID's and/or analgesics, corticosteriod injections, viscosupplementation injections and activity modification.  Onset of symptoms was gradual, starting 9+ months years ago with gradually worsening course since that time. The patient noted no past surgery on the right knee(s).  Patient currently rates pain in the right knee(s) at 6 out of 10 with activity. Patient has night pain, worsening of pain with activity and weight bearing, pain that interferes with activities of daily living, pain with passive range of motion, crepitus and joint swelling.  Patient has evidence of periarticular osteophytes and joint space narrowing by imaging studies.  There is no active infection.   Risks, benefits and expectations were discussed with the patient.  Risks including but not limited to the risk of anesthesia, blood clots, nerve damage, blood vessel damage, failure of the prosthesis, infection and up to and including death.  Patient understand the risks, benefits and expectations and wishes to proceed with surgery.   PCP: Annye Asa, MD  D/C Plans:      Home  Post-op Meds:       No Rx given   Tranexamic Acid:      To be given - IV   Decadron:      Is to be given  FYI:     ASA  Norco    Patient Active Problem List   Diagnosis Date Noted  . Right foot pain 02/02/2015  . Stress incontinence 01/05/2015  . Arthritis 01/05/2015  . Diarrhea, ? IBS, could be microscopic colitis 09/25/2013  . Disruption of suture line 11/09/2011  . Internal hemorrhoids with bleeding, prolapse, itching 11/09/2011  . General medical examination 09/17/2011  .  Neoplasm of uncertain behavior of skin 08/27/2011  . Anaphylaxis 02/14/2011  . GERD 09/18/2010  . Hyperlipidemia 08/20/2008  . DEPRESSIVE DISORDER 08/20/2008  . HYPERTENSION, BENIGN ESSENTIAL 08/20/2008   Past Medical History  Diagnosis Date  . Hypertension   . Hyperlipidemia   . Migraine   . Depression     Dr. Abner Greenspan  . Anaphylaxis   . Anxiety   . Arthritis   . Sleep apnea     ??  . Rosacea   . Cough   . Internal and external bleeding hemorrhoids   . PONV (postoperative nausea and vomiting)   . GERD (gastroesophageal reflux disease)     Past Surgical History  Procedure Laterality Date  . Tonsillectomy  1962  . Total shoulder replacement  2009    right  . Colonoscopy  09/15/2008    internal and external hemorroids  . Cosmetic surgery      multiple  . Appendectomy  2004  . Cesarean section  1982, 1985    x 2  . Hemorrhoid banding  2014  . Abdominoplasty      around 2000    No prescriptions prior to admission   Allergies  Allergen Reactions  . Ace Inhibitors     REACTION: cough    Social History  Substance Use Topics  . Smoking status: Never Smoker   . Smokeless tobacco: Never Used  . Alcohol Use: 0.6 oz/week    1 Standard drinks or equivalent per week  Comment: occasional wine    Family History  Problem Relation Age of Onset  . Hyperlipidemia Mother   . Heart disease Mother   . Diabetes Father   . Hypertension Father   . Heart attack Father     61s  . Heart disease Father   . Obesity Sister   . Kidney disease Sister   . Colon cancer Neg Hx      Review of Systems  Constitutional: Negative.   HENT: Negative.   Eyes: Negative.   Respiratory: Negative.   Cardiovascular: Negative.   Gastrointestinal: Positive for heartburn.  Genitourinary: Negative.   Musculoskeletal: Positive for joint pain.  Skin: Negative.   Neurological: Negative.   Endo/Heme/Allergies: Negative.   Psychiatric/Behavioral: Positive for depression.     Objective:  Physical Exam  Constitutional: She is oriented to person, place, and time. She appears well-developed.  HENT:  Head: Normocephalic.  Eyes: Pupils are equal, round, and reactive to light.  Neck: Neck supple. No JVD present. No tracheal deviation present. No thyromegaly present.  Cardiovascular: Normal rate, regular rhythm, normal heart sounds and intact distal pulses.   Respiratory: Effort normal and breath sounds normal. No stridor. No respiratory distress. She has no wheezes.  GI: Soft. There is no tenderness. There is no guarding.  Musculoskeletal:       Right knee: She exhibits decreased range of motion, swelling and bony tenderness. She exhibits no ecchymosis, no deformity, no laceration and no erythema. Tenderness found.  Lymphadenopathy:    She has no cervical adenopathy.  Neurological: She is alert and oriented to person, place, and time.  Skin: Skin is warm and dry.  Psychiatric: She has a normal mood and affect.      Labs:  Estimated body mass index is 31.25 kg/(m^2) as calculated from the following:   Height as of 02/02/15: 5' 0.75" (1.543 m).   Weight as of 02/02/15: 74.39 kg (164 lb).   Imaging Review Plain radiographs demonstrate severe degenerative joint disease of the right knee(s). The overall alignment is neutral. The bone quality appears to be good for age and reported activity level.  Assessment/Plan:  End stage arthritis, right knee   The patient history, physical examination, clinical judgment of the provider and imaging studies are consistent with end stage degenerative joint disease of the right knee(s) and total knee arthroplasty is deemed medically necessary. The treatment options including medical management, injection therapy arthroscopy and arthroplasty were discussed at length. The risks and benefits of total knee arthroplasty were presented and reviewed. The risks due to aseptic loosening, infection, stiffness, patella tracking  problems, thromboembolic complications and other imponderables were discussed. The patient acknowledged the explanation, agreed to proceed with the plan and consent was signed. Patient is being admitted for inpatient treatment for surgery, pain control, PT, OT, prophylactic antibiotics, VTE prophylaxis, progressive ambulation and ADL's and discharge planning. The patient is planning to be discharged home with home health services.     [place] Farley Crooker   PA-C  12/14/2015, 1:06 PM

## 2015-12-19 ENCOUNTER — Telehealth: Payer: Self-pay | Admitting: Family Medicine

## 2015-12-19 NOTE — Telephone Encounter (Signed)
LM for pt to call and schedule flu shot or update records. °

## 2015-12-25 NOTE — Anesthesia Preprocedure Evaluation (Addendum)
Anesthesia Evaluation  Patient identified by MRN, date of birth, ID band Patient awake    Reviewed: Allergy & Precautions, NPO status , Patient's Chart, lab work & pertinent test results  History of Anesthesia Complications (+) PONV and history of anesthetic complications  Airway Mallampati: III  TM Distance: >3 FB Neck ROM: Full    Dental no notable dental hx. (+) Dental Advisory Given, Poor Dentition   Pulmonary sleep apnea ,    Pulmonary exam normal breath sounds clear to auscultation       Cardiovascular hypertension, Pt. on medications Normal cardiovascular exam Rhythm:Regular Rate:Normal     Neuro/Psych  Headaches, PSYCHIATRIC DISORDERS Anxiety Depression negative psych ROS   GI/Hepatic Neg liver ROS, GERD  Medicated and Controlled,  Endo/Other  negative endocrine ROSobesity  Renal/GU negative Renal ROS  negative genitourinary   Musculoskeletal  (+) Arthritis , Osteoarthritis,    Abdominal   Peds negative pediatric ROS (+)  Hematology negative hematology ROS (+)   Anesthesia Other Findings Hx of "anaphylatic shock" for unknown reasons, reports that she has been evaluated for triggers and stress is the only trigger they have found. Last attack was a few years ago. She reports airway edema but never had to be intubated. She denies any possible allergens. She does carry an epipen but did not use it in the last attack.   Reproductive/Obstetrics negative OB ROS                            Anesthesia Physical Anesthesia Plan  ASA: II  Anesthesia Plan: Spinal   Post-op Pain Management:    Induction:   Airway Management Planned:   Additional Equipment:   Intra-op Plan:   Post-operative Plan:   Informed Consent: I have reviewed the patients History and Physical, chart, labs and discussed the procedure including the risks, benefits and alternatives for the proposed anesthesia with  the patient or authorized representative who has indicated his/her understanding and acceptance.   Dental advisory given  Plan Discussed with: CRNA  Anesthesia Plan Comments:         Anesthesia Quick Evaluation

## 2015-12-26 ENCOUNTER — Inpatient Hospital Stay (HOSPITAL_COMMUNITY): Payer: Medicare Other | Admitting: Anesthesiology

## 2015-12-26 ENCOUNTER — Encounter (HOSPITAL_COMMUNITY)
Admission: RE | Disposition: A | Discharge status: Home Health | Payer: Self-pay | Source: Ambulatory Visit | Attending: Orthopedic Surgery

## 2015-12-26 ENCOUNTER — Inpatient Hospital Stay (HOSPITAL_COMMUNITY)
Admission: RE | Admit: 2015-12-26 | Discharge: 2015-12-27 | DRG: 470 | Disposition: A | Discharge status: Home Health | Payer: Medicare Other | Source: Ambulatory Visit | Attending: Orthopedic Surgery | Admitting: Orthopedic Surgery

## 2015-12-26 ENCOUNTER — Encounter (HOSPITAL_COMMUNITY): Payer: Self-pay | Admitting: *Deleted

## 2015-12-26 DIAGNOSIS — I10 Essential (primary) hypertension: Secondary | ICD-10-CM | POA: Diagnosis present

## 2015-12-26 DIAGNOSIS — Z01812 Encounter for preprocedural laboratory examination: Secondary | ICD-10-CM

## 2015-12-26 DIAGNOSIS — Z96651 Presence of right artificial knee joint: Secondary | ICD-10-CM

## 2015-12-26 DIAGNOSIS — Z96611 Presence of right artificial shoulder joint: Secondary | ICD-10-CM | POA: Diagnosis present

## 2015-12-26 DIAGNOSIS — M659 Synovitis and tenosynovitis, unspecified: Secondary | ICD-10-CM | POA: Diagnosis present

## 2015-12-26 DIAGNOSIS — M1711 Unilateral primary osteoarthritis, right knee: Principal | ICD-10-CM | POA: Diagnosis present

## 2015-12-26 DIAGNOSIS — K219 Gastro-esophageal reflux disease without esophagitis: Secondary | ICD-10-CM | POA: Diagnosis present

## 2015-12-26 DIAGNOSIS — M25561 Pain in right knee: Secondary | ICD-10-CM | POA: Diagnosis not present

## 2015-12-26 DIAGNOSIS — M179 Osteoarthritis of knee, unspecified: Secondary | ICD-10-CM | POA: Diagnosis not present

## 2015-12-26 DIAGNOSIS — Z96659 Presence of unspecified artificial knee joint: Secondary | ICD-10-CM

## 2015-12-26 DIAGNOSIS — E785 Hyperlipidemia, unspecified: Secondary | ICD-10-CM | POA: Diagnosis present

## 2015-12-26 HISTORY — PX: TOTAL KNEE ARTHROPLASTY: SHX125

## 2015-12-26 LAB — ABO/RH: ABO/RH(D): O NEG

## 2015-12-26 LAB — TYPE AND SCREEN
ABO/RH(D): O NEG
Antibody Screen: NEGATIVE

## 2015-12-26 SURGERY — ARTHROPLASTY, KNEE, TOTAL
Anesthesia: Spinal | Site: Knee | Laterality: Right

## 2015-12-26 MED ORDER — METHOCARBAMOL 500 MG PO TABS
500.0000 mg | ORAL_TABLET | Freq: Four times a day (QID) | ORAL | Status: DC | PRN
  Administered 2015-12-27: 500 mg via ORAL
  Filled 2015-12-26 (×2): qty 1

## 2015-12-26 MED ORDER — CEFAZOLIN SODIUM-DEXTROSE 2-3 GM-% IV SOLR
INTRAVENOUS | Status: AC
  Filled 2015-12-26: qty 50

## 2015-12-26 MED ORDER — ONDANSETRON HCL 4 MG/2ML IJ SOLN
INTRAMUSCULAR | Status: DC | PRN
  Administered 2015-12-26: 4 mg via INTRAVENOUS

## 2015-12-26 MED ORDER — HYDROMORPHONE HCL 1 MG/ML IJ SOLN
0.5000 mg | INTRAMUSCULAR | Status: DC | PRN
  Administered 2015-12-26: 0.5 mg via INTRAVENOUS
  Administered 2015-12-26 – 2015-12-27 (×2): 1 mg via INTRAVENOUS
  Filled 2015-12-26 (×3): qty 1

## 2015-12-26 MED ORDER — ONDANSETRON HCL 4 MG/2ML IJ SOLN: 4.0000 mg | Freq: Four times a day (QID) | INTRAMUSCULAR | Status: DC | PRN

## 2015-12-26 MED ORDER — LIDOCAINE HCL (CARDIAC) 20 MG/ML IV SOLN
INTRAVENOUS | Status: DC | PRN
  Administered 2015-12-26: 60 mg via INTRAVENOUS

## 2015-12-26 MED ORDER — POLYVINYL ALCOHOL 1.4 % OP SOLN
1.0000 [drp] | Freq: Two times a day (BID) | OPHTHALMIC | Status: DC | PRN
  Filled 2015-12-26: qty 15

## 2015-12-26 MED ORDER — KETOROLAC TROMETHAMINE 30 MG/ML IJ SOLN
INTRAMUSCULAR | Status: AC
  Filled 2015-12-26: qty 1

## 2015-12-26 MED ORDER — CHLORHEXIDINE GLUCONATE 4 % EX LIQD
60.0000 mL | Freq: Once | CUTANEOUS | Status: DC
Start: 2015-12-26 — End: 2015-12-26

## 2015-12-26 MED ORDER — METHOCARBAMOL 1000 MG/10ML IJ SOLN
500.0000 mg | Freq: Four times a day (QID) | INTRAMUSCULAR | Status: DC | PRN
  Administered 2015-12-26 (×2): 500 mg via INTRAVENOUS
  Filled 2015-12-26 (×4): qty 5

## 2015-12-26 MED ORDER — FENTANYL CITRATE (PF) 100 MCG/2ML IJ SOLN: 25.0000 ug | INTRAMUSCULAR | Status: DC | PRN

## 2015-12-26 MED ORDER — DOCUSATE SODIUM 100 MG PO CAPS
100.0000 mg | ORAL_CAPSULE | Freq: Two times a day (BID) | ORAL | Status: DC
  Administered 2015-12-26 – 2015-12-27 (×3): 100 mg via ORAL

## 2015-12-26 MED ORDER — SODIUM CHLORIDE 0.9 % IJ SOLN
INTRAMUSCULAR | Status: AC
  Filled 2015-12-26: qty 50

## 2015-12-26 MED ORDER — LIDOCAINE HCL (CARDIAC) 20 MG/ML IV SOLN
INTRAVENOUS | Status: AC
  Filled 2015-12-26: qty 5

## 2015-12-26 MED ORDER — BUPIVACAINE-EPINEPHRINE (PF) 0.25% -1:200000 IJ SOLN
INTRAMUSCULAR | Status: AC
  Filled 2015-12-26: qty 30

## 2015-12-26 MED ORDER — LORATADINE 10 MG PO TABS
10.0000 mg | ORAL_TABLET | Freq: Every day | ORAL | Status: DC
Start: 2015-12-26 — End: 2015-12-27
  Administered 2015-12-26 – 2015-12-27 (×2): 10 mg via ORAL
  Filled 2015-12-26 (×2): qty 1

## 2015-12-26 MED ORDER — LACTATED RINGERS IV SOLN
INTRAVENOUS | Status: DC
Start: 2015-12-26 — End: 2015-12-27
  Administered 2015-12-26: 07:00:00 via INTRAVENOUS

## 2015-12-26 MED ORDER — ONDANSETRON HCL 4 MG PO TABS: 4.0000 mg | ORAL_TABLET | Freq: Four times a day (QID) | ORAL | Status: DC | PRN

## 2015-12-26 MED ORDER — PROPOFOL 10 MG/ML IV BOLUS
INTRAVENOUS | Status: AC
  Filled 2015-12-26: qty 40

## 2015-12-26 MED ORDER — ROCURONIUM BROMIDE 100 MG/10ML IV SOLN
INTRAVENOUS | Status: AC
  Filled 2015-12-26: qty 1

## 2015-12-26 MED ORDER — DEXAMETHASONE SODIUM PHOSPHATE 10 MG/ML IJ SOLN
10.0000 mg | Freq: Once | INTRAMUSCULAR | Status: AC
  Administered 2015-12-26: 10 mg via INTRAVENOUS

## 2015-12-26 MED ORDER — PHENYLEPHRINE HCL 10 MG/ML IJ SOLN
INTRAMUSCULAR | Status: DC | PRN
  Administered 2015-12-26 (×3): 40 ug via INTRAVENOUS

## 2015-12-26 MED ORDER — CEFAZOLIN SODIUM-DEXTROSE 2-3 GM-% IV SOLR
2.0000 g | INTRAVENOUS | Status: AC
  Administered 2015-12-26: 2 g via INTRAVENOUS

## 2015-12-26 MED ORDER — BUPROPION HCL ER (XL) 300 MG PO TB24
300.0000 mg | ORAL_TABLET | Freq: Every day | ORAL | Status: DC
  Administered 2015-12-26 – 2015-12-27 (×2): 300 mg via ORAL
  Filled 2015-12-26 (×2): qty 1

## 2015-12-26 MED ORDER — BISACODYL 10 MG RE SUPP: 10.0000 mg | Freq: Every day | RECTAL | Status: DC | PRN

## 2015-12-26 MED ORDER — SODIUM CHLORIDE 0.9 % IV SOLN
INTRAVENOUS | Status: DC
  Administered 2015-12-26: 13:00:00 via INTRAVENOUS
  Filled 2015-12-26 (×4): qty 1000

## 2015-12-26 MED ORDER — PHENYLEPHRINE 40 MCG/ML (10ML) SYRINGE FOR IV PUSH (FOR BLOOD PRESSURE SUPPORT)
PREFILLED_SYRINGE | INTRAVENOUS | Status: AC
  Filled 2015-12-26: qty 10

## 2015-12-26 MED ORDER — BUPIVACAINE IN DEXTROSE 0.75-8.25 % IT SOLN
INTRATHECAL | Status: DC | PRN
  Administered 2015-12-26: 2 mL via INTRATHECAL

## 2015-12-26 MED ORDER — STERILE WATER FOR IRRIGATION IR SOLN
Status: DC | PRN
  Administered 2015-12-26: 2000 mL

## 2015-12-26 MED ORDER — PROPOFOL 500 MG/50ML IV EMUL
INTRAVENOUS | Status: DC | PRN
  Administered 2015-12-26: 25 ug/kg/min via INTRAVENOUS

## 2015-12-26 MED ORDER — TRANEXAMIC ACID 1000 MG/10ML IV SOLN
1000.0000 mg | Freq: Once | INTRAVENOUS | Status: AC
  Administered 2015-12-26: 1000 mg via INTRAVENOUS
  Filled 2015-12-26: qty 10

## 2015-12-26 MED ORDER — PHENOL 1.4 % MT LIQD
1.0000 | OROMUCOSAL | Status: DC | PRN
  Filled 2015-12-26: qty 177

## 2015-12-26 MED ORDER — HYDROCODONE-ACETAMINOPHEN 7.5-325 MG PO TABS
1.0000 | ORAL_TABLET | ORAL | Status: DC
  Administered 2015-12-26: 1 via ORAL
  Administered 2015-12-26 (×2): 2 via ORAL
  Administered 2015-12-26: 1 via ORAL
  Administered 2015-12-27 (×4): 2 via ORAL
  Filled 2015-12-26: qty 2
  Filled 2015-12-26: qty 1
  Filled 2015-12-26: qty 2
  Filled 2015-12-26: qty 1
  Filled 2015-12-26 (×4): qty 2

## 2015-12-26 MED ORDER — METOCLOPRAMIDE HCL 10 MG PO TABS: 5.0000 mg | ORAL_TABLET | Freq: Three times a day (TID) | ORAL | Status: DC | PRN

## 2015-12-26 MED ORDER — SODIUM CHLORIDE 0.9 % IR SOLN
Status: DC | PRN
  Administered 2015-12-26: 1000 mL

## 2015-12-26 MED ORDER — FENTANYL CITRATE (PF) 100 MCG/2ML IJ SOLN
INTRAMUSCULAR | Status: DC | PRN
  Administered 2015-12-26 (×2): 50 ug via INTRAVENOUS

## 2015-12-26 MED ORDER — ALUM & MAG HYDROXIDE-SIMETH 200-200-20 MG/5ML PO SUSP: 30.0000 mL | ORAL | Status: DC | PRN

## 2015-12-26 MED ORDER — CEFAZOLIN SODIUM-DEXTROSE 2-3 GM-% IV SOLR
2.0000 g | Freq: Four times a day (QID) | INTRAVENOUS | Status: AC
  Administered 2015-12-26 (×2): 2 g via INTRAVENOUS
  Filled 2015-12-26 (×2): qty 50

## 2015-12-26 MED ORDER — MENTHOL 3 MG MT LOZG: 1.0000 | LOZENGE | OROMUCOSAL | Status: DC | PRN

## 2015-12-26 MED ORDER — POLYETHYLENE GLYCOL 3350 17 G PO PACK
17.0000 g | PACK | Freq: Two times a day (BID) | ORAL | Status: DC
  Administered 2015-12-27: 17 g via ORAL

## 2015-12-26 MED ORDER — SODIUM CHLORIDE 0.9 % IJ SOLN
INTRAMUSCULAR | Status: DC | PRN
  Administered 2015-12-26: 30 mL

## 2015-12-26 MED ORDER — DEXAMETHASONE SODIUM PHOSPHATE 10 MG/ML IJ SOLN
10.0000 mg | Freq: Once | INTRAMUSCULAR | Status: AC
Start: 2015-12-27 — End: 2015-12-27
  Administered 2015-12-27: 10 mg via INTRAVENOUS
  Filled 2015-12-26: qty 1

## 2015-12-26 MED ORDER — DEXAMETHASONE SODIUM PHOSPHATE 10 MG/ML IJ SOLN
INTRAMUSCULAR | Status: AC
  Filled 2015-12-26: qty 1

## 2015-12-26 MED ORDER — BUPIVACAINE-EPINEPHRINE (PF) 0.25% -1:200000 IJ SOLN
INTRAMUSCULAR | Status: DC | PRN
  Administered 2015-12-26: 30 mL

## 2015-12-26 MED ORDER — MIDAZOLAM HCL 5 MG/5ML IJ SOLN
INTRAMUSCULAR | Status: DC | PRN
  Administered 2015-12-26: 2 mg via INTRAVENOUS

## 2015-12-26 MED ORDER — METOCLOPRAMIDE HCL 5 MG/ML IJ SOLN
5.0000 mg | Freq: Three times a day (TID) | INTRAMUSCULAR | Status: DC | PRN
  Administered 2015-12-26: 10 mg via INTRAVENOUS
  Filled 2015-12-26: qty 2

## 2015-12-26 MED ORDER — ASPIRIN EC 325 MG PO TBEC
325.0000 mg | DELAYED_RELEASE_TABLET | Freq: Two times a day (BID) | ORAL | Status: DC
  Administered 2015-12-27: 325 mg via ORAL
  Filled 2015-12-26 (×3): qty 1

## 2015-12-26 MED ORDER — ESOMEPRAZOLE MAGNESIUM 20 MG PO CPDR
20.0000 mg | DELAYED_RELEASE_CAPSULE | Freq: Every day | ORAL | Status: DC
  Administered 2015-12-26 – 2015-12-27 (×2): 20 mg via ORAL
  Filled 2015-12-26 (×2): qty 1

## 2015-12-26 MED ORDER — FLUOXETINE HCL 20 MG PO CAPS
40.0000 mg | ORAL_CAPSULE | Freq: Every day | ORAL | Status: DC
  Administered 2015-12-26 – 2015-12-27 (×2): 40 mg via ORAL
  Filled 2015-12-26 (×2): qty 2

## 2015-12-26 MED ORDER — CELECOXIB 200 MG PO CAPS
200.0000 mg | ORAL_CAPSULE | Freq: Two times a day (BID) | ORAL | Status: DC
  Administered 2015-12-26 – 2015-12-27 (×2): 200 mg via ORAL
  Filled 2015-12-26 (×3): qty 1

## 2015-12-26 MED ORDER — ELETRIPTAN HYDROBROMIDE 20 MG PO TABS
20.0000 mg | ORAL_TABLET | ORAL | Status: DC | PRN
  Filled 2015-12-26: qty 1

## 2015-12-26 MED ORDER — MIDAZOLAM HCL 2 MG/2ML IJ SOLN
INTRAMUSCULAR | Status: AC
  Filled 2015-12-26: qty 2

## 2015-12-26 MED ORDER — DIPHENHYDRAMINE HCL 25 MG PO CAPS: 25.0000 mg | ORAL_CAPSULE | Freq: Four times a day (QID) | ORAL | Status: DC | PRN

## 2015-12-26 MED ORDER — MAGNESIUM CITRATE PO SOLN: 1.0000 | Freq: Once | ORAL | Status: DC | PRN

## 2015-12-26 MED ORDER — ONDANSETRON HCL 4 MG/2ML IJ SOLN
INTRAMUSCULAR | Status: AC
  Filled 2015-12-26: qty 2

## 2015-12-26 MED ORDER — HYDROCORTISONE 2.5 % RE CREA
1.0000 "application " | TOPICAL_CREAM | Freq: Two times a day (BID) | RECTAL | Status: DC | PRN
  Filled 2015-12-26: qty 28.35

## 2015-12-26 MED ORDER — 0.9 % SODIUM CHLORIDE (POUR BTL) OPTIME
TOPICAL | Status: DC | PRN
  Administered 2015-12-26: 1000 mL

## 2015-12-26 MED ORDER — NON FORMULARY: 20.0000 mg | Freq: Every day | Status: DC

## 2015-12-26 MED ORDER — ONDANSETRON HCL 4 MG/2ML IJ SOLN: 4.0000 mg | Freq: Once | INTRAMUSCULAR | Status: DC | PRN

## 2015-12-26 MED ORDER — FENTANYL CITRATE (PF) 100 MCG/2ML IJ SOLN
INTRAMUSCULAR | Status: AC
  Filled 2015-12-26: qty 2

## 2015-12-26 MED ORDER — KETOROLAC TROMETHAMINE 30 MG/ML IJ SOLN
INTRAMUSCULAR | Status: DC | PRN
  Administered 2015-12-26: 30 mg

## 2015-12-26 MED ORDER — FERROUS SULFATE 325 (65 FE) MG PO TABS
325.0000 mg | ORAL_TABLET | Freq: Three times a day (TID) | ORAL | Status: DC
  Administered 2015-12-26 – 2015-12-27 (×2): 325 mg via ORAL
  Filled 2015-12-26 (×6): qty 1

## 2015-12-26 SURGICAL SUPPLY — 45 items
BANDAGE ACE 6X5 VEL STRL LF (GAUZE/BANDAGES/DRESSINGS) ×3 IMPLANT
BLADE SAW SGTL 13.0X1.19X90.0M (BLADE) ×3 IMPLANT
BOWL SMART MIX CTS (DISPOSABLE) ×3 IMPLANT
CAPT KNEE TOTAL 3 ATTUNE ×2 IMPLANT
CEMENT HV SMART SET (Cement) ×4 IMPLANT
CLOTH BEACON ORANGE TIMEOUT ST (SAFETY) ×3 IMPLANT
CUFF TOURN SGL QUICK 34 (TOURNIQUET CUFF) ×3
CUFF TRNQT CYL 34X4X40X1 (TOURNIQUET CUFF) ×1 IMPLANT
DECANTER SPIKE VIAL GLASS SM (MISCELLANEOUS) ×3 IMPLANT
DRAPE U-SHAPE 47X51 STRL (DRAPES) ×3 IMPLANT
DRSG AQUACEL AG ADV 3.5X10 (GAUZE/BANDAGES/DRESSINGS) ×3 IMPLANT
DURAPREP 26ML APPLICATOR (WOUND CARE) ×6 IMPLANT
ELECT REM PT RETURN 9FT ADLT (ELECTROSURGICAL) ×3
ELECTRODE REM PT RTRN 9FT ADLT (ELECTROSURGICAL) ×1 IMPLANT
GLOVE BIO SURGEON STRL SZ 6.5 (GLOVE) ×1 IMPLANT
GLOVE BIO SURGEON STRL SZ7.5 (GLOVE) ×2 IMPLANT
GLOVE BIO SURGEONS STRL SZ 6.5 (GLOVE) ×1
GLOVE BIOGEL PI IND STRL 7.5 (GLOVE) ×1 IMPLANT
GLOVE BIOGEL PI IND STRL 8.5 (GLOVE) ×1 IMPLANT
GLOVE BIOGEL PI INDICATOR 7.5 (GLOVE) ×8
GLOVE BIOGEL PI INDICATOR 8.5 (GLOVE) ×2
GLOVE ECLIPSE 8.0 STRL XLNG CF (GLOVE) ×3 IMPLANT
GLOVE INDICATOR 6.5 STRL GRN (GLOVE) ×2 IMPLANT
GLOVE ORTHO TXT STRL SZ7.5 (GLOVE) ×6 IMPLANT
GLOVE SURG SS PI 8.0 STRL IVOR (GLOVE) ×2 IMPLANT
GOWN STRL REUS W/TWL LRG LVL3 (GOWN DISPOSABLE) ×3 IMPLANT
GOWN STRL REUS W/TWL XL LVL3 (GOWN DISPOSABLE) ×7 IMPLANT
HANDPIECE INTERPULSE COAX TIP (DISPOSABLE) ×3
LIQUID BAND (GAUZE/BANDAGES/DRESSINGS) ×3 IMPLANT
MANIFOLD NEPTUNE II (INSTRUMENTS) ×3 IMPLANT
PACK TOTAL KNEE CUSTOM (KITS) ×3 IMPLANT
POSITIONER SURGICAL ARM (MISCELLANEOUS) ×3 IMPLANT
SET HNDPC FAN SPRY TIP SCT (DISPOSABLE) ×1 IMPLANT
SET PAD KNEE POSITIONER (MISCELLANEOUS) ×3 IMPLANT
SUCTION FRAZIER HANDLE 12FR (TUBING) ×2
SUCTION TUBE FRAZIER 12FR DISP (TUBING) ×1 IMPLANT
SUT MNCRL AB 4-0 PS2 18 (SUTURE) ×3 IMPLANT
SUT VIC AB 1 CT1 36 (SUTURE) ×3 IMPLANT
SUT VIC AB 2-0 CT1 27 (SUTURE) ×9
SUT VIC AB 2-0 CT1 TAPERPNT 27 (SUTURE) ×3 IMPLANT
SUT VLOC 180 0 24IN GS25 (SUTURE) ×3 IMPLANT
SYR 50ML LL SCALE MARK (SYRINGE) ×3 IMPLANT
TRAY FOLEY W/METER SILVER 14FR (SET/KITS/TRAYS/PACK) ×3 IMPLANT
WRAP KNEE MAXI GEL POST OP (GAUZE/BANDAGES/DRESSINGS) ×3 IMPLANT
YANKAUER SUCT BULB TIP 10FT TU (MISCELLANEOUS) ×3 IMPLANT

## 2015-12-26 NOTE — Anesthesia Postprocedure Evaluation (Signed)
Anesthesia Post Note  Patient: Cassandra Little  Procedure(s) Performed: Procedure(s) (LRB): TOTAL KNEE ARTHROPLASTY (Right)  Patient location during evaluation: PACU Anesthesia Type: Spinal Level of consciousness: awake and alert Pain management: pain level controlled Vital Signs Assessment: post-procedure vital signs reviewed and stable Respiratory status: spontaneous breathing, nonlabored ventilation, respiratory function stable and patient connected to nasal cannula oxygen Cardiovascular status: blood pressure returned to baseline and stable Postop Assessment: no signs of nausea or vomiting and spinal receding Anesthetic complications: no    Last Vitals:  Filed Vitals:   12/26/15 0520 12/26/15 0923  BP: 141/87 115/91  Pulse: 81 64  Temp: 37 C 36.4 C  Resp: 18 15    Last Pain:  Filed Vitals:   12/26/15 0948  PainSc: 0-No pain                 Hasson Gaspard JENNETTE

## 2015-12-26 NOTE — Evaluation (Signed)
Physical Therapy Evaluation Patient Details Name: Cassandra Little MRN: [id number] DOB: Nov 18, 1945 Today's Date: 12/26/2015   History of Present Illness  Pt is a 70 year old female s/p R TKA  Clinical Impression  Pt is s/p R TKA resulting in the deficits listed below (see PT Problem List).  Pt will benefit from skilled PT to increase their independence and safety with mobility to allow discharge to the venue listed below.  Pt ambulated short distance POD #0 and plans to return home with assist from her friend.     Follow Up Recommendations Outpatient PT (plans to have OPPT)    Equipment Recommendations  Rolling walker with 5" wheels    Recommendations for Other Services       Precautions / Restrictions Precautions Precautions: Knee Restrictions Other Position/Activity Restrictions: WBAT      Mobility  Bed Mobility Overal bed mobility: Needs Assistance Bed Mobility: Supine to Sit     Supine to sit: HOB elevated;Supervision     General bed mobility comments: verbal cues for line management  Transfers Overall transfer level: Needs assistance Equipment used: Rolling walker (2 wheeled) Transfers: Sit to/from Stand Sit to Stand: Min guard         General transfer comment: verbal cues for UE and LE positioning  Ambulation/Gait Ambulation/Gait assistance: Min guard Ambulation Distance (Feet): 50 Feet Assistive device: Rolling walker (2 wheeled) Gait Pattern/deviations: Step-to pattern;Antalgic     General Gait Details: verbal cues for sequence, step length, RW positioning, posture  Stairs            Wheelchair Mobility    Modified Rankin (Stroke Patients Only)       Balance                                             Pertinent Vitals/Pain Pain Assessment: 0-10 Pain Score: 3  Pain Location: R knee Pain Descriptors / Indicators: Sore;Aching Pain Intervention(s): Limited activity within patient's tolerance;Monitored during  session;Repositioned;Ice applied    Home Living Family/patient expects to be discharged to:: Private residence Living Arrangements: Alone Available Help at Discharge: Friend(s) Type of Home: House Home Access: Stairs to enter   Technical brewer of Steps: 3 Home Layout: Two level;Bed/bath upstairs Home Equipment: None      Prior Function Level of Independence: Independent         Comments: likes yoga     Hand Dominance        Extremity/Trunk Assessment               Lower Extremity Assessment: RLE deficits/detail RLE Deficits / Details: unable to perform SLR, observed at least 30* of functional knee flexion       Communication   Communication: No difficulties  Cognition Arousal/Alertness: Awake/alert Behavior During Therapy: WFL for tasks assessed/performed Overall Cognitive Status: Within Functional Limits for tasks assessed                      General Comments      Exercises        Assessment/Plan    PT Assessment Patient needs continued PT services  PT Diagnosis Difficulty walking;Acute pain   PT Problem List Decreased strength;Decreased range of motion;Decreased mobility;Decreased knowledge of use of DME;Pain  PT Treatment Interventions Functional mobility training;Stair training;Gait training;DME instruction;Patient/family education;Therapeutic activities;Therapeutic exercise   PT Goals (Current goals can be found  in the Care Plan section) Acute Rehab PT Goals PT Goal Formulation: With patient Time For Goal Achievement: 12/31/15 Potential to Achieve Goals: Good    Frequency 7X/week   Barriers to discharge        Co-evaluation               End of Session Equipment Utilized During Treatment: Gait belt Activity Tolerance: Patient tolerated treatment well Patient left: in chair;with call bell/phone within reach;with chair alarm set;with family/visitor present Nurse Communication: Mobility status         Time:  1353-1414 PT Time Calculation (min) (ACUTE ONLY): 21 min   Charges:   PT Evaluation $PT Eval Low Complexity: 1 Procedure     PT G Codes:        Cassandra Little,Cassandra Little 12/26/2015, 2:39 PM Carmelia Bake, PT, DPT 12/26/2015 Pager: [phone]

## 2015-12-26 NOTE — Transfer of Care (Signed)
Immediate Anesthesia Transfer of Care Note  Patient: Cassandra Little  Procedure(s) Performed: Procedure(s): TOTAL KNEE ARTHROPLASTY (Right)  Patient Location: PACU  Anesthesia Type:Spinal  Level of Consciousness:  sedated, patient cooperative and responds to stimulation  Airway & Oxygen Therapy:Patient Spontanous Breathing and Patient connected to face mask oxgen  Post-op Assessment:  Report given to PACU RN and Post -op Vital signs reviewed and stable  Post vital signs:  Reviewed and stable  Last Vitals:  Filed Vitals:   12/26/15 0520  BP: 141/87  Pulse: 81  Temp: 37 C  Resp: 18    Complications: No apparent anesthesia complications

## 2015-12-26 NOTE — Anesthesia Procedure Notes (Signed)
Spinal Patient location during procedure: OR Staffing Anesthesiologist: Nikki Rusnak Performed by: anesthesiologist  Preanesthetic Checklist Completed: patient identified, site marked, surgical consent, pre-op evaluation, timeout performed, IV checked, risks and benefits discussed and monitors and equipment checked Spinal Block Patient position: sitting Prep: ChloraPrep Patient monitoring: continuous pulse ox, blood pressure and heart rate Approach: midline Location: L3-4 Injection technique: single-shot Needle Needle type: Quincke  Needle gauge: 22 G Needle length: 9 cm Additional Notes Functioning IV was confirmed and monitors were applied. Sterile prep and drape, including hand hygiene, mask and sterile gloves were used. The patient was positioned and the spine was prepped. The skin was anesthetized with lidocaine.  Free flow of clear CSF was obtained prior to injecting local anesthetic into the CSF.  The spinal needle aspirated freely following injection.  The needle was carefully withdrawn.  The patient tolerated the procedure well. Consent was obtained prior to procedure with all questions answered and concerns addressed. Risks including but not limited to bleeding, infection, nerve damage, paralysis, failed block, inadequate analgesia, allergic reaction, high spinal, itching and headache were discussed and the patient wished to proceed.   Lauretta Grill, MD

## 2015-12-26 NOTE — Progress Notes (Signed)
Utilization review completed.  

## 2015-12-26 NOTE — Discharge Instructions (Signed)

## 2015-12-26 NOTE — Op Note (Signed)
NAME:  Cassandra Little                      MEDICAL RECORD NO.:  [id number]                             FACILITY:  [facility]      PHYSICIAN:  Pietro Cassis. Alvan Dame, M.D.  DATE OF BIRTH:  July 13, 1946      DATE OF PROCEDURE:  12/26/2015                                     OPERATIVE REPORT         PREOPERATIVE DIAGNOSIS:  Right knee osteoarthritis.      POSTOPERATIVE DIAGNOSIS:  Right knee osteoarthritis.      FINDINGS:  The patient was noted to have complete loss of cartilage and   bone-on-bone arthritis with associated osteophytes in the medial and patellofemoral compartments of   the knee with a significant synovitis and associated effusion.      PROCEDURE:  Right total knee replacement.      COMPONENTS USED:  DePuy Attune rotating platform posterior stabilized knee   system, a size 4N femur, 3 tibia, size 7 mm PS AOX insert, and 32 anatomic patellar   button.      SURGEON:  Pietro Cassis. Alvan Dame, M.D.      ASSISTANT:  Danae Orleans, PA-C.      ANESTHESIA:  Spinal.      SPECIMENS:  None.      COMPLICATION:  None.      DRAINS:  None  EBL: <50cc      TOURNIQUET TIME:   Total Tourniquet Time Documented: Thigh (Right) - 329 minutes Total: Thigh (Right) - 29 minutes  .      The patient was stable to the recovery room.      INDICATION FOR PROCEDURE:  Cassandra Little is a 70 y.o. female patient of   mine.  The patient had been seen, evaluated, and treated conservatively in the   office with medication, activity modification, and injections.  The patient had   radiographic changes of bone-on-bone arthritis with endplate sclerosis and osteophytes noted.      The patient failed conservative measures including medication, injections, and activity modification, and at this point was ready for more definitive measures.   Based on the radiographic changes and failed conservative measures, the patient   decided to proceed with total knee replacement.  Risks of infection,   DVT, component failure,  need for revision surgery, postop course, and   expectations were all   discussed and reviewed.  Consent was obtained for benefit of pain   relief.      PROCEDURE IN DETAIL:  The patient was brought to the operative theater.   Once adequate anesthesia, preoperative antibiotics, 2 gm of Ancef, 1 gm of Tranexamic Acid, and 10 mg of Decadron administered, the patient was positioned supine with the right thigh tourniquet placed.  The  right lower extremity was prepped and draped in sterile fashion.  A time-   out was performed identifying the patient, planned procedure, and   extremity.      The right lower extremity was placed in the [facility] leg holder.  The leg was   exsanguinated, tourniquet elevated to 250 mmHg.  A midline incision was   made followed by  median parapatellar arthrotomy.  Following initial   exposure, attention was first directed to the patella.  Precut   measurement was noted to be 21 mm.  I resected down to 14 mm and used a   32 patellar button to restore patellar height as well as cover the cut   surface.      The lug holes were drilled and a metal shim was placed to protect the   patella from retractors and saw blades.      At this point, attention was now directed to the femur.  The femoral   canal was opened with a drill, irrigated to try to prevent fat emboli.  An   intramedullary rod was passed at 3 degrees valgus, 8 mm of bone was   resected off the distal femur.  Following this resection, the tibia was   subluxated anteriorly.  Using the extramedullary guide, 2 mm of bone was resected off   the proximal medial tibia.  We confirmed the gap would be   stable medially and laterally with a size 6 mm insert as well as confirmed   the cut was perpendicular in the coronal plane, checking with an alignment rod.      Once this was done, I sized the femur to be a size 4 in the anterior-   posterior dimension, chose a narrow component based on medial and   lateral  dimension.  The size 4 rotation block was then pinned in   position anterior referenced using the C-clamp to set rotation.  The   anterior, posterior, and  chamfer cuts were made without difficulty nor   notching making certain that I was along the anterior cortex to help   with flexion gap stability.      The final box cut was made off the lateral aspect of distal femur.      At this point, the tibia was sized to be a size 3, the size 3 tray was   then pinned in position through the medial third of the tubercle,   drilled, and keel punched.  Trial reduction was now carried with a 4 femur,  3 tibia, a size 6 then 7 mm insert, and the 32 patella botton.  The knee was brought to   extension, full extension with good flexion stability with the patella   tracking through the trochlea without application of pressure.  Given   all these findings the femoral lug holes were drilled and then the the trial components removed.  Final components were   opened and cement was mixed.  The knee was irrigated with normal saline   solution and pulse lavage.  The synovial lining was   then injected with 30 cc of 0.25% Marcaine with epinephrine and 1 cc of Toradol plus 30 cc of NS for a    total of 61 cc.      The knee was irrigated.  Final implants were then cemented onto clean and   dried cut surfaces of bone with the knee brought to extension with a size 7 mm trial insert.      Once the cement had fully cured, the excess cement was removed   throughout the knee.  I confirmed I was satisfied with the range of   motion and stability, and the final size 7 mm PS AOX insert was chosen.  It was   placed into the knee.      The tourniquet had been let down at 29  minutes.  No significant   hemostasis required.  The   extensor mechanism was then reapproximated using #1 Vicryl and #0 V-lock sutures with the knee   in flexion.  The   remaining wound was closed with 2-0 Vicryl and running 4-0 Monocryl.   The  knee was cleaned, dried, dressed sterilely using Dermabond and   Aquacel dressing.  The patient was then   brought to recovery room in stable condition, tolerating the procedure   well.   Please note that Physician Assistant, Danae Orleans, PA-C, was present for the entirety of the case, and was utilized for pre-operative positioning, peri-operative retractor management, general facilitation of the procedure.  He was also utilized for primary wound closure at the end of the case.              Pietro Cassis Alvan Dame, M.D.    12/26/2015 8:58 AM

## 2015-12-26 NOTE — Interval H&P Note (Signed)
History and Physical Interval Note:  12/26/2015 7:22 AM  Cassandra Little  has presented today for surgery, with the diagnosis of RIGHT KNEE OA  The various methods of treatment have been discussed with the patient and family. After consideration of risks, benefits and other options for treatment, the patient has consented to  Procedure(s): TOTAL KNEE ARTHROPLASTY (Right) as a surgical intervention .  The patient's history has been reviewed, patient examined, no change in status, stable for surgery.  I have reviewed the patient's chart and labs.  Questions were answered to the patient's satisfaction.     Mauri Pole

## 2015-12-27 LAB — BASIC METABOLIC PANEL
Anion gap: 7 (ref 5–15)
BUN: 18 mg/dL (ref 6–20)
CALCIUM: 8.5 mg/dL — AB (ref 8.9–10.3)
CO2: 28 mmol/L (ref 22–32)
CREATININE: 0.71 mg/dL (ref 0.44–1.00)
Chloride: 103 mmol/L (ref 101–111)
GFR calc non Af Amer: >60 mL/min (ref >60)
Glucose, Bld: 117 mg/dL — ABNORMAL HIGH (ref 65–99)
Potassium: 4.4 mmol/L (ref 3.5–5.1)
SODIUM: 138 mmol/L (ref 135–145)

## 2015-12-27 LAB — CBC
HCT: 32.7 % — ABNORMAL LOW (ref 36.0–46.0)
Hemoglobin: 10.7 g/dL — ABNORMAL LOW (ref 12.0–15.0)
MCH: 28 pg (ref 26.0–34.0)
MCHC: 32.7 g/dL (ref 30.0–36.0)
MCV: 85.6 fL (ref 78.0–100.0)
PLATELETS: 225 10*3/uL (ref 150–400)
RBC: 3.82 MIL/uL — ABNORMAL LOW (ref 3.87–5.11)
RDW: 14.3 % (ref 11.5–15.5)
WBC: 13.9 10*3/uL — ABNORMAL HIGH (ref 4.0–10.5)

## 2015-12-27 MED ORDER — ASPIRIN 325 MG PO TBEC: 325.0000 mg | DELAYED_RELEASE_TABLET | Freq: Two times a day (BID) | ORAL | Status: AC

## 2015-12-27 MED ORDER — HYDROCODONE-ACETAMINOPHEN 7.5-325 MG PO TABS: 1.0000 | ORAL_TABLET | ORAL | Status: DC | PRN

## 2015-12-27 MED ORDER — TIZANIDINE HCL 4 MG PO TABS: 4.0000 mg | ORAL_TABLET | Freq: Four times a day (QID) | ORAL | Status: DC | PRN

## 2015-12-27 MED ORDER — POLYETHYLENE GLYCOL 3350 17 G PO PACK: 17.0000 g | PACK | Freq: Two times a day (BID) | ORAL | Status: DC

## 2015-12-27 MED ORDER — CELECOXIB 200 MG PO CAPS
200.0000 mg | ORAL_CAPSULE | Freq: Two times a day (BID) | ORAL | Status: DC
Start: 2015-12-27 — End: 2016-02-27

## 2015-12-27 MED ORDER — FERROUS SULFATE 325 (65 FE) MG PO TABS: 325.0000 mg | ORAL_TABLET | Freq: Three times a day (TID) | ORAL | Status: DC

## 2015-12-27 MED ORDER — DOCUSATE SODIUM 100 MG PO CAPS: 100.0000 mg | ORAL_CAPSULE | Freq: Two times a day (BID) | ORAL | Status: DC

## 2015-12-27 NOTE — Progress Notes (Signed)
Physical Therapy Treatment Patient Details Name: Cassandra Little MRN: [id number] DOB: [birth year] Today's Date: 12/27/2015    History of Present Illness Pt is a 70 year old female s/p R TKA    PT Comments    POD # 1 pm session.  Friend present during session who has had a TKR prior and very knowledgeable.  Practiced stairs and issued one crutch.  Pt ready for D/C to home.    Follow Up Recommendations  Outpatient PT     Equipment Recommendations  Rolling walker with 5" wheels;Crutches;3in1 (PT)    Recommendations for Other Services       Precautions / Restrictions Precautions Precautions: Fall;Knee Restrictions Weight Bearing Restrictions: No Other Position/Activity Restrictions: WBAT    Mobility  Bed Mobility               General bed mobility comments: Pt OOB in recliner  Transfers Overall transfer level: Needs assistance Equipment used: Rolling walker (2 wheeled) Transfers: Sit to/from Stand Sit to Stand: Min guard         General transfer comment: verbal cues for UE and LE positioning and esp with turns  Ambulation/Gait Ambulation/Gait assistance: Min guard Ambulation Distance (Feet): 22 Feet Assistive device: Rolling walker (2 wheeled) Gait Pattern/deviations: Step-to pattern;Decreased stance time - right     General Gait Details: verbal cues for sequence, step length, RW positioning, posture   Stairs Stairs: Yes   Stair Management: No rails;One rail Left;Step to pattern;Backwards;Forwards;With walker;With crutches Number of Stairs: 14 General stair comments: practiced 2 different situations.  First up backward with walker 2 steps no rails.  then up 12 steps forward with one rail and one crutch.  Both times with friend present for education on safe handling.    Wheelchair Mobility    Modified Rankin (Stroke Patients Only)       Balance                                    Cognition Arousal/Alertness: Awake/alert Behavior  During Therapy: WFL for tasks assessed/performed Overall Cognitive Status: Within Functional Limits for tasks assessed                      Exercises      General Comments        Pertinent Vitals/Pain Pain Assessment: 0-10 Pain Score: 6  Pain Location: R knee Pain Descriptors / Indicators: Sore;Tightness Pain Intervention(s): Monitored during session;Repositioned;Ice applied    Home Living                      Prior Function            PT Goals (current goals can now be found in the care plan section) Progress towards PT goals: Progressing toward goals    Frequency  7X/week    PT Plan Current plan remains appropriate    Co-evaluation             End of Session Equipment Utilized During Treatment: Gait belt Activity Tolerance: Patient tolerated treatment well Patient left: in chair;with call bell/phone within reach;with chair alarm set;with family/visitor present     Time: 1345-1415 PT Time Calculation (min) (ACUTE ONLY): 30 min  Charges:  $Gait Training: 8-22 mins $Therapeutic Activity: 8-22 mins                    G Codes:  Rica Koyanagi  PTA [facility]  Acute  Rehab Pager      [phone]

## 2015-12-27 NOTE — Evaluation (Signed)
Occupational Therapy Evaluation Patient Details Name: Cassandra Little MRN: [id number] DOB: 18-May-1946 Today's Date: 12/27/2015    History of Present Illness Pt is a 70 year old female s/p R TKA   Clinical Impression   Pt s/p TKA. OT education complete.    Follow Up Recommendations  No OT follow up    Equipment Recommendations  None recommended by OT    Recommendations for Other Services       Precautions / Restrictions Precautions Precautions: Knee      Mobility Bed Mobility Overal bed mobility: Needs Assistance Bed Mobility: Supine to Sit     Supine to sit: HOB elevated;Supervision     General bed mobility comments: verbal cues for line management  Transfers Overall transfer level: Needs assistance Equipment used: Rolling walker (2 wheeled) Transfers: Sit to/from [employer] Transfers Sit to Stand: Supervision Stand pivot transfers: Supervision       General transfer comment: verbal cues for UE and LE positioning    Balance                                            ADL Overall ADL's : Needs assistance/impaired                                       General ADL Comments: Pt overall S with ADL activity. OT educated pt on strategies s/p knee surgery to A with bathing, dressing and functional activity.  Pts friends will be helping her upon DC.  No DME needs from OT standpoint               Pertinent Vitals/Pain Pain Score: 0-No pain     Hand Dominance     Extremity/Trunk Assessment Upper Extremity Assessment Upper Extremity Assessment: Overall WFL for tasks assessed           Communication Communication Communication: No difficulties   Cognition Arousal/Alertness: Awake/alert Behavior During Therapy: WFL for tasks assessed/performed Overall Cognitive Status: Within Functional Limits for tasks assessed                     General Comments               Home Living Family/patient  expects to be discharged to:: Private residence Living Arrangements: Alone Available Help at Discharge: Friend(s) Type of Home: House Home Access: Stairs to enter Technical brewer of Steps: 3   Home Layout: Two level;Bed/bath upstairs Alternate Level Stairs-Number of Steps: 14   Bathroom Shower/Tub: Occupational psychologist: Standard Bathroom Accessibility: Yes   Home Equipment: None          Prior Functioning/Environment Level of Independence: Independent        Comments: likes yoga                           Co-evaluation              End of Session Equipment Utilized During Treatment: Rolling walker  Activity Tolerance: Patient tolerated treatment well Patient left: in chair   Time: [id number] OT Time Calculation (min): 30 min Charges:  OT General Charges $OT Visit: 1 Procedure OT Evaluation $OT Eval Low Complexity: 1 Procedure OT Treatments $Self Care/Home Management : 8-22 mins G-Codes:  Betsy Pries 12/27/2015, 12:14 PM

## 2015-12-27 NOTE — Progress Notes (Signed)
Physical Therapy Treatment Patient Details Name: Derisha Oliveri MRN: [id number] DOB: [birth year] Today's Date: 12/27/2015    History of Present Illness Pt is a 70 year old female s/p R TKA    PT Comments    POD # 1 am session.  Assisted with amb to bathroom.  Slow, unsteady gait requiring 50% VC's on proper walker to self distance and to increase heel strike R LE.  75% VC's safety with turns and hand placement transition.  Amb in hallway then returned to room to perform TKR TE's.  Pt demonstrated impaired cognition and required repeat VC's to stay on task.  Performed TKR TE's following HEP followed by ICE.    Follow Up Recommendations        Equipment Recommendations       Recommendations for Other Services       Precautions / Restrictions Precautions Precautions: Fall;Knee Restrictions Weight Bearing Restrictions: No Other Position/Activity Restrictions: WBAT    Mobility  Bed Mobility               General bed mobility comments: Pt OOB in recliner  Transfers Overall transfer level: Needs assistance Equipment used: Rolling walker (2 wheeled) Transfers: Sit to/from Stand Sit to Stand: Min guard         General transfer comment: verbal cues for UE and LE positioning and esp with turns  Ambulation/Gait Ambulation/Gait assistance: Min guard Ambulation Distance (Feet): 54 Feet Assistive device: Rolling walker (2 wheeled) Gait Pattern/deviations: Step-to pattern;Decreased stance time - right     General Gait Details: verbal cues for sequence, step length, RW positioning, posture   Stairs            Wheelchair Mobility    Modified Rankin (Stroke Patients Only)       Balance                                    Cognition Arousal/Alertness: Awake/alert Behavior During Therapy: WFL for tasks assessed/performed Overall Cognitive Status: Within Functional Limits for tasks assessed                      Exercises   Total Knee  Replacement TE's 10 reps B LE ankle pumps 10 reps towel squeezes 10 reps knee presses 10 reps heel slides  10 reps SAQ's 10 reps SLR's 10 reps ABD Followed by ICE     General Comments        Pertinent Vitals/Pain Pain Assessment: 0-10 Pain Score: 6  Pain Location: R knee Pain Descriptors / Indicators: Sore;Tightness Pain Intervention(s): Monitored during session;Repositioned;Ice applied    Home Living                      Prior Function            PT Goals (current goals can now be found in the care plan section)      Frequency       PT Plan      Co-evaluation             End of Session           Time: 1010-1040 PT Time Calculation (min) (ACUTE ONLY): 30 min  Charges:  $Gait Training: 8-22 mins $Therapeutic Exercise: 8-22 mins                    G Codes:  Rica Koyanagi  PTA [facility]  Acute  Rehab Pager      [phone]

## 2015-12-27 NOTE — Progress Notes (Signed)
[facility]  Delivered rw and commode to room   Linward Headland 12/27/2015, 10:53 AM

## 2015-12-27 NOTE — Progress Notes (Signed)
Patient ID: Cassandra Little, female   DOB: [birth year], 70 y.o.   MRN: [id number] Subjective: 1 Day Post-Op Procedure(s) (LRB): TOTAL KNEE ARTHROPLASTY (Right)    Patient reports pain as moderate.  Doing ok, no events.  Sitting up in bed with breakfast  Objective:   VITALS:   Filed Vitals:   12/27/15 0144 12/27/15 0520  BP: 117/76 108/69  Pulse: 75 69  Temp: 98.1 F (36.7 C) 98 F (36.7 C)  Resp: 16 16    Neurovascular intact Incision: dressing C/D/I  LABS  Recent Labs  12/27/15 0404  HGB 10.7*  HCT 32.7*  WBC 13.9*  PLT 225     Recent Labs  12/27/15 0404  NA 138  K 4.4  BUN 18  CREATININE 0.71  GLUCOSE 117*    No results for input(s): LABPT, INR in the last 72 hours.   Assessment/Plan: 1 Day Post-Op Procedure(s) (LRB): TOTAL KNEE ARTHROPLASTY (Right)   Advance diet Up with therapy Discharge home with home health today after therapy  RTC in 2 weeks Reviewed goals

## 2015-12-27 NOTE — Care Management Note (Signed)
Case Management Note  Patient Details  Name: Cassandra Little MRN: [id number] Date of Birth: 1946-10-08  Subjective/Objective:                  TOTAL KNEE ARTHROPLASTY (Right) Action/Plan: Discharge planning Expected Discharge Date:  12/27/15               Expected Discharge Plan:  Home/Self Care  In-House Referral:     Discharge planning Services  CM Consult  Post Acute Care Choice:    Choice offered to:  Patient  DME Arranged:  3-N-1, Walker rolling DME Agency:  NA  HH Arranged:  NA HH Agency:  NA  Status of Service:  Completed, signed off  Medicare Important Message Given:    Date Medicare IM Given:    Medicare IM give by:    Date Additional Medicare IM Given:    Additional Medicare Important Message give by:     If discussed at [place] of Stay Meetings, dates discussed:    Additional Comments: CM met with pt in room to confirm presurgical plan of NO HH services as pt is going straight to outpt therapy.  Pt confirms this is still the plan.  CM called [facility] DME rep, Lecretia to please deliver the rolling walker and 3n1 to room prior to discharge.  No other CM needs were communicated. Dellie Catholic, RN 12/27/2015, 10:03 AM

## 2015-12-29 DIAGNOSIS — M25561 Pain in right knee: Secondary | ICD-10-CM | POA: Diagnosis not present

## 2016-01-02 DIAGNOSIS — M25561 Pain in right knee: Secondary | ICD-10-CM | POA: Diagnosis not present

## 2016-01-02 NOTE — Discharge Summary (Signed)
Physician Discharge Summary  Patient ID: Cassandra Little MRN: [id number] DOB/AGE: 22-Aug-1946 70 y.o.  Admit date: 12/26/2015 Discharge date: 12/27/2015   Procedures:  Procedure(s) (LRB): TOTAL KNEE ARTHROPLASTY (Right)  Attending Physician:  Dr. Paralee Cancel   Admission Diagnoses:   Right knee primary OA / pain  Discharge Diagnoses:  Principal Problem:   S/P right TKA Active Problems:   S/P knee replacement  Past Medical History  Diagnosis Date  . Hypertension   . Hyperlipidemia   . Migraine   . Depression     Dr. Abner Greenspan  . Anaphylaxis   . Anxiety   . Arthritis   . Sleep apnea     ??  . Rosacea   . Cough   . Internal and external bleeding hemorrhoids   . PONV (postoperative nausea and vomiting)   . GERD (gastroesophageal reflux disease)     HPI:    Cassandra Little, 70 y.o. female, has a history of pain and functional disability in the right knee due to arthritis and has failed non-surgical conservative treatments for greater than 12 weeks to include NSAID's and/or analgesics, corticosteriod injections, viscosupplementation injections and activity modification. Onset of symptoms was gradual, starting 9+ months years ago with gradually worsening course since that time. The patient noted no past surgery on the right knee(s). Patient currently rates pain in the right knee(s) at 6 out of 10 with activity. Patient has night pain, worsening of pain with activity and weight bearing, pain that interferes with activities of daily living, pain with passive range of motion, crepitus and joint swelling. Patient has evidence of periarticular osteophytes and joint space narrowing by imaging studies. There is no active infection. Risks, benefits and expectations were discussed with the patient. Risks including but not limited to the risk of anesthesia, blood clots, nerve damage, blood vessel damage, failure of the prosthesis, infection and up to and including death. Patient understand  the risks, benefits and expectations and wishes to proceed with surgery.   PCP: Annye Asa, MD   Discharged Condition: good  Hospital Course:  Patient underwent the above stated procedure on 12/26/2015. Patient tolerated the procedure well and brought to the recovery room in good condition and subsequently to the floor.  POD #1 BP: 108/69 ; Pulse: 69 ; Temp: 98 F (36.7 C) ; Resp: 16 Patient reports pain as moderate. Doing ok, no events. Sitting up in bed with breakfast.  Ready to be discharged home. Neurovascular intact and incision: dressing C/D/I  LABS  Basename    HGB     10.7  HCT     32.7     Discharge Exam: General appearance: alert, cooperative and no distress Extremities: Homans sign is negative, no sign of DVT, no edema, redness or tenderness in the calves or thighs and no ulcers, gangrene or trophic changes  Disposition: Home with follow up in 2 weeks   Follow-up Information    Follow up with Cassandra Pole, MD. Schedule an appointment as soon as possible for a visit in 2 weeks.   Specialty:  Orthopedic Surgery   Contact information:   [address] [place] [address] [phone]       Follow up with [facility].   Why:  rolling walker and 3n1   Contact information:   [place] [address] [phone]       Discharge Instructions    Call MD / Call 911    Complete by:  As directed   If you experience chest pain or shortness of breath, CALL 911 and be transported to the hospital emergency room.  If you develope a fever above 101 F, pus (white drainage) or increased drainage or redness at the wound, or calf pain, call your surgeon's office.     Change dressing    Complete by:  As directed   Maintain surgical dressing until follow up in the clinic. If the edges start to pull up, may reinforce with tape. If the dressing is no longer working, may remove and cover with gauze and tape, but must  keep the area dry and clean.  Call with any questions or concerns.     Constipation Prevention    Complete by:  As directed   Drink plenty of fluids.  Prune juice may be helpful.  You may use a stool softener, such as Colace (over the counter) 100 mg twice a day.  Use MiraLax (over the counter) for constipation as needed.     Diet - low sodium heart healthy    Complete by:  As directed      Discharge instructions    Complete by:  As directed   Maintain surgical dressing until follow up in the clinic. If the edges start to pull up, may reinforce with tape. If the dressing is no longer working, may remove and cover with gauze and tape, but must keep the area dry and clean.  Follow up in 2 weeks at [facility]. Call with any questions or concerns.     Increase activity slowly as tolerated    Complete by:  As directed   Weight bearing as tolerated with assist device (walker, cane, etc) as directed, use it as long as suggested by your surgeon or therapist, typically at least 4-6 weeks.     TED hose    Complete by:  As directed   Use stockings (TED hose) for 2 weeks on both leg(s).  You may remove them at night for sleeping.             Medication List    STOP taking these medications        conjugated estrogens vaginal cream  Commonly known as:  PREMARIN     meloxicam 7.5 MG tablet  Commonly known as:  MOBIC      TAKE these medications        aspirin 325 MG EC tablet  Take 1 tablet (325 mg total) by mouth 2 (two) times daily.     b complex vitamins tablet  Take 1 tablet by mouth daily.     buPROPion 300 MG 24 hr tablet  Commonly known as:  WELLBUTRIN XL  Take 300 mg by mouth daily.     celecoxib 200 MG capsule  Commonly known as:  CELEBREX  Take 1 capsule (200 mg total) by mouth every 12 (twelve) hours.     cetirizine 10 MG tablet  Commonly known as:  ZYRTEC  Take 10 mg by mouth daily as needed for allergies.     cholecalciferol 1000 units tablet  Commonly  known as:  VITAMIN D  Take 1,000 Units by mouth daily.     docusate sodium 100 MG capsule  Commonly known as:  COLACE  Take 1 capsule (100 mg total) by mouth 2 (two) times daily.     eletriptan 20 MG tablet  Commonly known as:  RELPAX  Take 1 tablet (20 mg total) by mouth as needed. may repeat  in 2 hours if necessary     EPINEPHrine 0.3 mg/0.3 mL Soaj injection  Commonly known as:  EPIPEN 2-PAK  INJECT 0.3 MLS (0.3 MG TOTAL) INTO THE MUSCLE ONCE.     esomeprazole 20 MG capsule  Commonly known as:  NEXIUM  Take 20 mg by mouth daily at 12 noon.     ferrous sulfate 325 (65 FE) MG tablet  Take 1 tablet (325 mg total) by mouth 3 (three) times daily after meals.     FLUoxetine 40 MG capsule  Commonly known as:  PROZAC  Take 40 mg by mouth daily.     HYDROcodone-acetaminophen 7.5-325 MG tablet  Commonly known as:  NORCO  Take 1-2 tablets by mouth every 4 (four) hours as needed for moderate pain.     hydrocortisone 2.5 % rectal cream  Commonly known as:  PROCTOZONE-HC  Place 1 application rectally 2 (two) times daily.     polyethylene glycol packet  Commonly known as:  MIRALAX / GLYCOLAX  Take 17 g by mouth 2 (two) times daily.     polyvinyl alcohol 1.4 % ophthalmic solution  Commonly known as:  LIQUIFILM TEARS  Place 1 drop into both eyes 2 (two) times daily as needed for dry eyes.     tiZANidine 4 MG tablet  Commonly known as:  ZANAFLEX  Take 1 tablet (4 mg total) by mouth every 6 (six) hours as needed for muscle spasms.         Signed: [place]. Yasira Engelson   PA-C  01/02/2016, 9:23 PM

## 2016-01-04 DIAGNOSIS — M25561 Pain in right knee: Secondary | ICD-10-CM | POA: Diagnosis not present

## 2016-01-09 DIAGNOSIS — Z471 Aftercare following joint replacement surgery: Secondary | ICD-10-CM | POA: Diagnosis not present

## 2016-01-09 DIAGNOSIS — M25561 Pain in right knee: Secondary | ICD-10-CM | POA: Diagnosis not present

## 2016-01-09 DIAGNOSIS — Z96651 Presence of right artificial knee joint: Secondary | ICD-10-CM | POA: Diagnosis not present

## 2016-01-11 DIAGNOSIS — M25561 Pain in right knee: Secondary | ICD-10-CM | POA: Diagnosis not present

## 2016-01-16 DIAGNOSIS — M25561 Pain in right knee: Secondary | ICD-10-CM | POA: Diagnosis not present

## 2016-01-18 DIAGNOSIS — M25561 Pain in right knee: Secondary | ICD-10-CM | POA: Diagnosis not present

## 2016-01-23 DIAGNOSIS — M25561 Pain in right knee: Secondary | ICD-10-CM | POA: Diagnosis not present

## 2016-01-25 DIAGNOSIS — M25561 Pain in right knee: Secondary | ICD-10-CM | POA: Diagnosis not present

## 2016-01-30 DIAGNOSIS — M25561 Pain in right knee: Secondary | ICD-10-CM | POA: Diagnosis not present

## 2016-02-07 DIAGNOSIS — M25561 Pain in right knee: Secondary | ICD-10-CM | POA: Diagnosis not present

## 2016-02-08 DIAGNOSIS — Z471 Aftercare following joint replacement surgery: Secondary | ICD-10-CM | POA: Diagnosis not present

## 2016-02-08 DIAGNOSIS — Z96641 Presence of right artificial hip joint: Secondary | ICD-10-CM | POA: Diagnosis not present

## 2016-02-15 ENCOUNTER — Telehealth: Payer: Self-pay | Admitting: Family Medicine

## 2016-02-15 MED ORDER — TRIAMCINOLONE ACETONIDE 0.1 % EX CREA: 1.0000 "application " | TOPICAL_CREAM | Freq: Two times a day (BID) | CUTANEOUS | Status: DC

## 2016-02-15 NOTE — Telephone Encounter (Signed)
Pt states that she has gotten poison ivy x 1 wk and calamine lotion is not helping, pt asking if Dr Birdie Riddle can call in something stronger, [facility] on [address].Ph [phone]

## 2016-02-15 NOTE — Telephone Encounter (Signed)
Pt informed of PCP recommendations and stated an understanding and agreement.

## 2016-02-15 NOTE — Telephone Encounter (Signed)
Medication filled to pharmacy as requested.   

## 2016-02-15 NOTE — Telephone Encounter (Signed)
I try and avoid prednisone in people who have had recent joint replacements.  We can send in Triamcinolone cream 0.1% for her to use topically on the itchy areas and she can take Zyrtec or Benadryl OTC

## 2016-02-27 ENCOUNTER — Encounter: Payer: Self-pay | Admitting: Obstetrics & Gynecology

## 2016-02-27 ENCOUNTER — Ambulatory Visit (INDEPENDENT_AMBULATORY_CARE_PROVIDER_SITE_OTHER): Payer: Medicare Other | Admitting: Obstetrics & Gynecology

## 2016-02-27 VITALS — BP 128/84 | HR 78 | Resp 14 | Ht 60.75 in | Wt 158.0 lb

## 2016-02-27 DIAGNOSIS — Z124 Encounter for screening for malignant neoplasm of cervix: Secondary | ICD-10-CM | POA: Diagnosis not present

## 2016-02-27 DIAGNOSIS — Z20828 Contact with and (suspected) exposure to other viral communicable diseases: Secondary | ICD-10-CM | POA: Diagnosis not present

## 2016-02-27 DIAGNOSIS — Z01419 Encounter for gynecological examination (general) (routine) without abnormal findings: Secondary | ICD-10-CM | POA: Diagnosis not present

## 2016-02-27 DIAGNOSIS — Z205 Contact with and (suspected) exposure to viral hepatitis: Secondary | ICD-10-CM

## 2016-02-27 NOTE — Patient Instructions (Signed)
Check with Dr. Birdie Riddle about your last tetanus.  My documentation is that is was done in 2003.

## 2016-02-27 NOTE — Progress Notes (Signed)
70 y.o. G2P2 DivorcedCaucasianF here for annual exam.  Doing well.  Had knee replacement with Dr. Alvan Dame in March.  Pt can sit [place] style.  She is done with physical therapy.  She ended up needing only six weeks.  Denies vaginal bleeding.    No LMP recorded. Patient is postmenopausal.          Sexually active: No.  The current method of family planning is none.    Exercising: Yes.    yoga and recumbant bike  Smoker:  no  Health Maintenance: Pap:  01/05/2015 negative History of abnormal Pap:  no  MMG:  09/28/2015 BIRADS 1 negative  Colonoscopy:  09/15/2008 repeat 10 years BMD:   08/31/2013 repeat 5 years TDaP:  PCP Screening Labs: PCP, Hb today: PCP, Urine today: PCP   reports that she has never smoked. She has never used smokeless tobacco. She reports that she drinks about 0.6 oz of alcohol per week. She reports that she does not use illicit drugs.  Past Medical History  Diagnosis Date  . Hypertension   . Hyperlipidemia   . Migraine   . Depression     Dr. Abner Greenspan  . Anaphylaxis   . Anxiety   . Arthritis   . Sleep apnea     ??  . Rosacea   . Cough   . Internal and external bleeding hemorrhoids   . PONV (postoperative nausea and vomiting)   . GERD (gastroesophageal reflux disease)     Past Surgical History  Procedure Laterality Date  . Tonsillectomy  1962  . Total shoulder replacement  2009    right  . Colonoscopy  09/15/2008    internal and external hemorroids  . Cosmetic surgery      multiple  . Appendectomy  2004  . Cesarean section  1982, 1985    x 2  . Hemorrhoid banding  2014  . Abdominoplasty      around 2000  . Total knee arthroplasty Right 12/26/2015    Procedure: TOTAL KNEE ARTHROPLASTY;  Surgeon: Paralee Cancel, MD;  Location: [facility];  Service: Orthopedics;  Laterality: Right;    Current Outpatient Prescriptions  Medication Sig Dispense Refill  . b complex vitamins tablet Take 1 tablet by mouth daily.    Marland Kitchen buPROPion (WELLBUTRIN XL) 300 MG 24 hr  tablet Take 300 mg by mouth daily.     . celecoxib (CELEBREX) 200 MG capsule Take 1 capsule (200 mg total) by mouth every 12 (twelve) hours. 60 capsule 0  . cetirizine (ZYRTEC) 10 MG tablet Take 10 mg by mouth daily as needed for allergies.     . cholecalciferol (VITAMIN D) 1000 units tablet Take 1,000 Units by mouth daily.    Marland Kitchen docusate sodium (COLACE) 100 MG capsule Take 1 capsule (100 mg total) by mouth 2 (two) times daily. 10 capsule 0  . eletriptan (RELPAX) 20 MG tablet Take 1 tablet (20 mg total) by mouth as needed. may repeat in 2 hours if necessary 10 tablet 2  . EPINEPHrine (EPIPEN 2-PAK) 0.3 mg/0.3 mL IJ SOAJ injection INJECT 0.3 MLS (0.3 MG TOTAL) INTO THE MUSCLE ONCE. 2 Device 1  . esomeprazole (NEXIUM) 20 MG capsule Take 20 mg by mouth daily at 12 noon.    . ferrous sulfate 325 (65 FE) MG tablet Take 1 tablet (325 mg total) by mouth 3 (three) times daily after meals.  3  . FLUoxetine (PROZAC) 40 MG capsule Take 40 mg by mouth daily.     Marland Kitchen  HYDROcodone-acetaminophen (NORCO) 7.5-325 MG tablet Take 1-2 tablets by mouth every 4 (four) hours as needed for moderate pain. 100 tablet 0  . hydrocortisone (PROCTOZONE-HC) 2.5 % rectal cream Place 1 application rectally 2 (two) times daily. (Patient taking differently: Place 1 application rectally 2 (two) times daily as needed for hemorrhoids or itching. ) 30 g 0  . polyethylene glycol (MIRALAX / GLYCOLAX) packet Take 17 g by mouth 2 (two) times daily. 14 each 0  . polyvinyl alcohol (LIQUIFILM TEARS) 1.4 % ophthalmic solution Place 1 drop into both eyes 2 (two) times daily as needed for dry eyes.    Marland Kitchen tiZANidine (ZANAFLEX) 4 MG tablet Take 1 tablet (4 mg total) by mouth every 6 (six) hours as needed for muscle spasms. 40 tablet 0  . triamcinolone cream (KENALOG) 0.1 % Apply 1 application topically 2 (two) times daily. 30 g 1   No current facility-administered medications for this visit.    Family History  Problem Relation Age of Onset  .  Hyperlipidemia Mother   . Heart disease Mother   . Diabetes Father   . Hypertension Father   . Heart attack Father     12s  . Heart disease Father   . Obesity Sister   . Kidney disease Sister   . Colon cancer Neg Hx     ROS:  Pertinent items are noted in HPI.  Otherwise, a comprehensive ROS was negative.  Exam:   General appearance: alert, cooperative and appears stated age Head: Normocephalic, without obvious abnormality, atraumatic Neck: no adenopathy, supple, symmetrical, trachea midline and thyroid normal to inspection and palpation Lungs: clear to auscultation bilaterally Breasts: normal appearance, no masses or tenderness, bilateral implants present Heart: regular rate and rhythm Abdomen: soft, non-tender; bowel sounds normal; no masses,  no organomegaly Extremities: extremities normal, atraumatic, no cyanosis or edema Skin: Skin color, texture, turgor normal. No rashes or lesions Lymph nodes: Cervical, supraclavicular, and axillary nodes normal. No abnormal inguinal nodes palpated Neurologic: Grossly normal   Pelvic: External genitalia:  no lesions              Urethra:  normal appearing urethra with no masses, tenderness or lesions              Bartholins and Skenes: normal                 Vagina: normal appearing vagina with normal color and discharge, no lesions              Cervix: no lesions              Pap taken: No. Bimanual Exam:  Uterus:  normal size, contour, position, consistency, mobility, non-tender              Adnexa: normal adnexa and no mass, fullness, tenderness               Rectovaginal: Confirms               Anus:  normal sphincter tone, no lesions  Chaperone was present for exam.   A: Well Woman with normal exam SUI. Desirous of evaluation and treatment recommendations. PMP, no HRT Breast implants Elevated lipids Arthritis External hemorrhoids. S/p internal banding.  P: Mammogram yearly pap smear 2016.  No pap today.  H/O neg HR HPV 2014. Labs with Dr. Birdie Riddle.  Hep C testing will be done today. Declines tetanus update today.  She feels this is up to date and will discuss with Dr.  Birdie Riddle return annually or prn

## 2016-02-28 LAB — HEPATITIS C ANTIBODY: HCV AB: NEGATIVE

## 2016-03-22 ENCOUNTER — Ambulatory Visit: Payer: BLUE CROSS/BLUE SHIELD | Admitting: Obstetrics & Gynecology

## 2016-03-22 DIAGNOSIS — I788 Other diseases of capillaries: Secondary | ICD-10-CM | POA: Diagnosis not present

## 2016-03-22 DIAGNOSIS — L821 Other seborrheic keratosis: Secondary | ICD-10-CM | POA: Diagnosis not present

## 2016-03-22 DIAGNOSIS — L570 Actinic keratosis: Secondary | ICD-10-CM | POA: Diagnosis not present

## 2016-03-22 DIAGNOSIS — Z85828 Personal history of other malignant neoplasm of skin: Secondary | ICD-10-CM | POA: Diagnosis not present

## 2016-04-27 DIAGNOSIS — H01004 Unspecified blepharitis left upper eyelid: Secondary | ICD-10-CM | POA: Diagnosis not present

## 2016-04-27 DIAGNOSIS — H04123 Dry eye syndrome of bilateral lacrimal glands: Secondary | ICD-10-CM | POA: Diagnosis not present

## 2016-04-27 DIAGNOSIS — H01001 Unspecified blepharitis right upper eyelid: Secondary | ICD-10-CM | POA: Diagnosis not present

## 2016-07-31 DIAGNOSIS — H01001 Unspecified blepharitis right upper eyelid: Secondary | ICD-10-CM | POA: Diagnosis not present

## 2016-07-31 DIAGNOSIS — H01002 Unspecified blepharitis right lower eyelid: Secondary | ICD-10-CM | POA: Diagnosis not present

## 2016-07-31 DIAGNOSIS — H04123 Dry eye syndrome of bilateral lacrimal glands: Secondary | ICD-10-CM | POA: Diagnosis not present

## 2016-07-31 DIAGNOSIS — H01004 Unspecified blepharitis left upper eyelid: Secondary | ICD-10-CM | POA: Diagnosis not present

## 2016-07-31 DIAGNOSIS — H01005 Unspecified blepharitis left lower eyelid: Secondary | ICD-10-CM | POA: Diagnosis not present

## 2016-10-17 ENCOUNTER — Other Ambulatory Visit: Payer: Self-pay | Admitting: Obstetrics & Gynecology

## 2016-10-17 DIAGNOSIS — Z1231 Encounter for screening mammogram for malignant neoplasm of breast: Secondary | ICD-10-CM

## 2016-11-13 ENCOUNTER — Ambulatory Visit
Admission: RE | Admit: 2016-11-13 | Discharge: 2016-11-13 | Disposition: A | Discharge status: Home / Self Care | Payer: Medicare Other | Source: Ambulatory Visit | Attending: Obstetrics & Gynecology | Admitting: Obstetrics & Gynecology

## 2016-11-13 DIAGNOSIS — Z1231 Encounter for screening mammogram for malignant neoplasm of breast: Secondary | ICD-10-CM | POA: Diagnosis not present

## 2017-02-19 ENCOUNTER — Encounter: Payer: Self-pay | Admitting: General Practice

## 2017-02-20 ENCOUNTER — Encounter: Payer: Self-pay | Admitting: Family Medicine

## 2017-02-20 ENCOUNTER — Ambulatory Visit (INDEPENDENT_AMBULATORY_CARE_PROVIDER_SITE_OTHER): Payer: Medicare Other | Admitting: Family Medicine

## 2017-02-20 VITALS — BP 114/72 | HR 73 | Temp 97.0°F | Resp 18 | Ht 61.0 in | Wt 159.2 lb

## 2017-02-20 DIAGNOSIS — F329 Major depressive disorder, single episode, unspecified: Secondary | ICD-10-CM

## 2017-02-20 DIAGNOSIS — F32A Depression, unspecified: Secondary | ICD-10-CM

## 2017-02-20 DIAGNOSIS — E559 Vitamin D deficiency, unspecified: Secondary | ICD-10-CM | POA: Diagnosis not present

## 2017-02-20 DIAGNOSIS — Z0000 Encounter for general adult medical examination without abnormal findings: Secondary | ICD-10-CM

## 2017-02-20 DIAGNOSIS — Z23 Encounter for immunization: Secondary | ICD-10-CM

## 2017-02-20 DIAGNOSIS — K649 Unspecified hemorrhoids: Secondary | ICD-10-CM

## 2017-02-20 DIAGNOSIS — E785 Hyperlipidemia, unspecified: Secondary | ICD-10-CM

## 2017-02-20 DIAGNOSIS — K644 Residual hemorrhoidal skin tags: Secondary | ICD-10-CM

## 2017-02-20 LAB — LIPID PANEL
CHOL/HDL RATIO: 3.7 ratio (ref <5.0)
Cholesterol: 205 mg/dL — ABNORMAL HIGH (ref <200)
HDL: 55 mg/dL (ref >50)
LDL CALC: 120 mg/dL — AB (ref <100)
TRIGLYCERIDES: 152 mg/dL — AB (ref <150)
VLDL: 30 mg/dL (ref <30)

## 2017-02-20 LAB — BASIC METABOLIC PANEL
BUN: 27 mg/dL — ABNORMAL HIGH (ref 7–25)
CALCIUM: 9.5 mg/dL (ref 8.6–10.4)
CO2: 23 mmol/L (ref 20–31)
CREATININE: 0.92 mg/dL (ref 0.50–0.99)
Chloride: 101 mmol/L (ref 98–110)
GLUCOSE: 83 mg/dL (ref 65–99)
Potassium: 4.6 mmol/L (ref 3.5–5.3)
Sodium: 139 mmol/L (ref 135–146)

## 2017-02-20 LAB — CBC WITH DIFFERENTIAL/PLATELET
BASOS PCT: 1 %
Basophils Absolute: 62 cells/uL (ref 0–200)
EOS ABS: 124 {cells}/uL (ref 15–500)
Eosinophils Relative: 2 %
HCT: 40.6 % (ref 35.0–45.0)
Hemoglobin: 13.3 g/dL (ref 11.7–15.5)
Lymphocytes Relative: 38 %
Lymphs Abs: 2356 cells/uL (ref 850–3900)
MCH: 28.6 pg (ref 27.0–33.0)
MCHC: 32.8 g/dL (ref 32.0–36.0)
MCV: 87.3 fL (ref 80.0–100.0)
MPV: 10 fL (ref 7.5–12.5)
Monocytes Absolute: 558 cells/uL (ref 200–950)
Monocytes Relative: 9 %
NEUTROS ABS: 3100 {cells}/uL (ref 1500–7800)
Neutrophils Relative %: 50 %
Platelets: 260 10*3/uL (ref 140–400)
RBC: 4.65 MIL/uL (ref 3.80–5.10)
RDW: 14.9 % (ref 11.0–15.0)
WBC: 6.2 10*3/uL (ref 3.8–10.8)

## 2017-02-20 LAB — HEPATIC FUNCTION PANEL
ALT: 21 U/L (ref 6–29)
AST: 24 U/L (ref 10–35)
Albumin: 4.2 g/dL (ref 3.6–5.1)
Alkaline Phosphatase: 67 U/L (ref 33–130)
BILIRUBIN DIRECT: 0.1 mg/dL (ref <=0.2)
BILIRUBIN INDIRECT: 0.3 mg/dL (ref 0.2–1.2)
TOTAL PROTEIN: 6.7 g/dL (ref 6.1–8.1)
Total Bilirubin: 0.4 mg/dL (ref 0.2–1.2)

## 2017-02-20 LAB — TSH: TSH: 3.3 m[IU]/L

## 2017-02-20 MED ORDER — ZOSTER VAC RECOMB ADJUVANTED 50 MCG/0.5ML IM SUSR: 0.5000 mL | Freq: Once | INTRAMUSCULAR | 1 refills | Status: AC

## 2017-02-20 MED ORDER — HYDROCORTISONE 2.5 % RE CREA: 1.0000 "application " | TOPICAL_CREAM | Freq: Two times a day (BID) | RECTAL | 3 refills | Status: DC

## 2017-02-20 MED ORDER — EPINEPHRINE 0.3 MG/0.3ML IJ SOAJ: INTRAMUSCULAR | 1 refills | Status: DC

## 2017-02-20 NOTE — Progress Notes (Signed)
Subjective:   Cassandra Little is a 71 y.o. female who presents for an Initial Medicare Annual Wellness Visit.  Review of Systems    No ROS.  Medicare Wellness Visit.   Cardiac Risk Factors include: advanced age (>42men, >71 women);dyslipidemia;hypertension;family history of premature cardiovascular disease   Sleep patterns: Sleeps 8 hours, up to void x 1. Feels rested.  Home Safety/Smoke Alarms: smoke detectors in place  Living environment; residence and Firearm Safety: Lives alone in 2 story home. Feels safe in home. No firearms. Seat Belt Safety/Bike Helmet: Wears seat belt.   Counseling:   Eye Exam-Last exam 2017, yearly Dr. Earlie Raveling exam < 6 months, Dr. Sung Amabile  Female:   Pap-2016       Mammo-11/13/2016, negative.     Dexa scan-08/31/2013, normal. Declines testing at this time.        [facility]S-Colonoscopy 09/15/2008, recall 10 years.       Objective:    Today's Vitals   02/20/17 1507  BP: 114/72  Pulse: 73  Resp: 18  Temp: 97 F (36.1 C)  TempSrc: Oral  SpO2: 97%  Weight: 159 lb 3.2 oz (72.2 kg)  Height: 5\' 1"  (1.549 m)   Body mass index is 30.08 kg/m.   Current Medications (verified) Outpatient Encounter Prescriptions as of 02/20/2017  Medication Sig  . b complex vitamins tablet Take 1 tablet by mouth daily.  Marland Kitchen buPROPion (WELLBUTRIN XL) 300 MG 24 hr tablet Take 300 mg by mouth daily.   . cetirizine (ZYRTEC) 10 MG tablet Take 10 mg by mouth daily as needed for allergies. Reported on 02/27/2016  . cholecalciferol (VITAMIN D) 1000 units tablet Take 1,000 Units by mouth daily.  Marland Kitchen eletriptan (RELPAX) 20 MG tablet Take 1 tablet (20 mg total) by mouth as needed. may repeat in 2 hours if necessary  . EPINEPHrine (EPIPEN 2-PAK) 0.3 mg/0.3 mL IJ SOAJ injection INJECT 0.3 MLS (0.3 MG TOTAL) INTO THE MUSCLE ONCE.  Marland Kitchen escitalopram (LEXAPRO) 20 MG tablet Take 20 mg by mouth daily.  Marland Kitchen esomeprazole (NEXIUM) 20 MG capsule Take 20 mg by mouth daily at 12 noon.  .  [DISCONTINUED] EPINEPHrine (EPIPEN 2-PAK) 0.3 mg/0.3 mL IJ SOAJ injection INJECT 0.3 MLS (0.3 MG TOTAL) INTO THE MUSCLE ONCE.  Marland Kitchen FLUoxetine (PROZAC) 40 MG capsule Take 40 mg by mouth daily.   . hydrocortisone (PROCTOZONE-HC) 2.5 % rectal cream Place 1 application rectally 2 (two) times daily. (Patient not taking: Reported on 02/27/2016)  . ibuprofen (ADVIL,MOTRIN) 200 MG tablet Take by mouth.  . polyvinyl alcohol (LIQUIFILM TEARS) 1.4 % ophthalmic solution Place 1 drop into both eyes 2 (two) times daily as needed for dry eyes. Reported on 02/27/2016   No facility-administered encounter medications on file as of 02/20/2017.     Allergies (verified) Ace inhibitors   History: Past Medical History:  Diagnosis Date  . Anaphylaxis   . Anxiety   . Arthritis   . Cough   . Depression    Dr. Abner Greenspan  . Exposure to hepatitis C   . GERD (gastroesophageal reflux disease)   . Hyperlipidemia   . Hypertension   . Internal and external bleeding hemorrhoids   . Migraine   . PONV (postoperative nausea and vomiting)   . Rosacea   . Sleep apnea    ??   Past Surgical History:  Procedure Laterality Date  . ABDOMINOPLASTY     around 2000  . APPENDECTOMY  2004  . [place]   x 2  .  COLONOSCOPY  09/15/2008   internal and external hemorroids  . COSMETIC SURGERY     multiple  . HEMORRHOID BANDING  2014  . TONSILLECTOMY  1962  . TOTAL KNEE ARTHROPLASTY Right 12/26/2015   Procedure: TOTAL KNEE ARTHROPLASTY;  Surgeon: Paralee Cancel, MD;  Location: [facility];  Service: Orthopedics;  Laterality: Right;  . TOTAL SHOULDER REPLACEMENT  2009   right   Family History  Problem Relation Age of Onset  . Hyperlipidemia Mother   . Heart disease Mother   . Diabetes Father   . Hypertension Father   . Heart attack Father     85s  . Heart disease Father   . Obesity Sister   . Kidney disease Sister   . Colon cancer Neg Hx    Social History   Occupational History  . Not on file.   Social  History Main Topics  . Smoking status: Never Smoker  . Smokeless tobacco: Never Used  . Alcohol use 0.6 oz/week    1 Standard drinks or equivalent per week     Comment: occasional wine  . Drug use: No  . Sexual activity: No    Tobacco Counseling Counseling given: Not Answered   Activities of Daily Living In your present state of health, do you have any difficulty performing the following activities: 02/20/2017  Hearing? N  Vision? N  Difficulty concentrating or making decisions? N  Walking or climbing stairs? N  Dressing or bathing? N  Doing errands, shopping? N  Preparing Food and eating ? N  Using the Toilet? N  In the past six months, have you accidently leaked urine? N  Do you have problems with loss of bowel control? N  Managing your Medications? N  Managing your Finances? N  Housekeeping or managing your Housekeeping? N  Some recent data might be hidden    Immunizations and Health Maintenance Immunization History  Administered Date(s) Administered  . Hepatitis B 08/10/2005, 09/19/2005, 05/28/2006  . Td 03/12/2002  . Zoster 05/02/2009   Shingles Vaccine ordered today.   Health Maintenance Due  Topic Date Due  . TETANUS/TDAP  03/12/2012  . PNA vac Low Risk Adult (1 of 2 - PCV13) 05/27/2013    Patient Care Team: Midge Minium, MD as PCP - General Paralee Cancel, MD as Consulting Physician (Orthopedic Surgery) Megan Salon, MD as Consulting Physician (Gynecology) Chucky May, MD as Consulting Physician (Psychiatry)  Indicate any recent Medical Services you may have received from other than [facility] providers in the past year (date may be approximate).     Assessment:   This is a routine wellness examination for Cassandra Little. Physical assessment deferred to PCP.   Hearing/Vision screen Hearing Screening Comments: Able to hear conversational tones w/o difficulty. No issues reported.   Vision Screening Comments: Wears contacts.   Dietary issues and  exercise activities discussed: Current Exercise Habits: Structured exercise class (pool, yard work, pure Radio broadcast assistant), Type of exercise: yoga, Frequency (Times/Week): 5, Exercise limited by: None identified   Diet (meal preparation, eat out, water intake, caffeinated beverages, dairy products, fruits and vegetables):  Drinks protein smoothie, water, coffee and tea.   Breakfast: smoothie (protein), oatmeal, fruit, toast, egg, bacon Lunch: snacks on protein and vegetables.  Dinner:  Dance movement psychotherapist, vegetables.     Encouraged to continue healthy lifestyle.   Goals    . Weight (lb) < 150 lb (68 kg)          Lose weight by increasing balance and core work.  Depression Screen PHQ 2/9 Scores 02/20/2017 11/09/2015  PHQ - 2 Score 0 0  Exception Documentation - Patient refusal    Fall Risk Fall Risk  02/20/2017 11/09/2015  Falls in the past year? No Yes  Number falls in past yr: - 1  Injury with Fall? - Yes  Risk for fall due to : - Impaired balance/gait  Follow up - Falls prevention discussed    Cognitive Function:       Ad8 score reviewed for issues:  Issues making decisions: no  Less interest in hobbies / activities: no  Repeats questions, stories (family complaining): no  Trouble using ordinary gadgets (microwave, computer, phone): no  Forgets the month or year: no  Mismanaging finances: no   Remembering appts: no  Daily problems with thinking and/or memory: no Ad8 score is=0     Screening Tests Health Maintenance  Topic Date Due  . TETANUS/TDAP  03/12/2012  . PNA vac Low Risk Adult (1 of 2 - PCV13) 05/27/2013  . INFLUENZA VACCINE  05/15/2017  . COLONOSCOPY  09/15/2018  . MAMMOGRAM  11/13/2018  . DEXA SCAN  Completed  . Hepatitis C Screening  Completed   Declines DEXA scan at this time.     Plan:    Bring a copy of your advance directives to your next office visit.  Continue doing brain stimulating activities (puzzles, reading, adult coloring books, staying  active) to keep memory sharp.   Continue to eat heart healthy diet (full of fruits, vegetables, whole grains, lean protein, water--limit salt, fat, and sugar intake) and increase physical activity as tolerated.  Shingrix Vaccine  I have personally reviewed and noted the following in the patient's chart:   . Medical and social history . Use of alcohol, tobacco or illicit drugs  . Current medications and supplements . Functional ability and status . Nutritional status . Physical activity . Advanced directives . List of other physicians . Hospitalizations, surgeries, and ER visits in previous 12 months . Vitals . Screenings to include cognitive, depression, and falls . Referrals and appointments  In addition, I have reviewed and discussed with patient certain preventive protocols, quality metrics, and best practice recommendations. A written personalized care plan for preventive services as well as general preventive health recommendations were provided to patient.     Gerilyn Nestle, RN   02/20/2017   Reviewed documentation and agree w/ above.  Annye Asa, MD

## 2017-02-20 NOTE — Progress Notes (Signed)
   Subjective:    Patient ID: Cassandra Little, female    DOB: 1946/07/22, 71 y.o.   MRN: [id number]  HPI Hyperlipidemia- chronic problem.  Currently diet controlled.  Pt continues to exercise regularly- yoga 5-6x/week, yard work.  Denies CP, SOB, HAs, visual changes, edema, abd pain, N/V.  Depression- chronic problem.  Following Dr Toy Care.  On wellbutrin, lexapro.  sxs are currently well controlled.  Hemorrhoids- chronic problem.  Pt had internal hemorrhoids banded w/ good relief but external hemorrhoids tend to wax and wane.     Review of Systems For ROS see HPI     Objective:   Physical Exam  Constitutional: She is oriented to person, place, and time. She appears well-developed and well-nourished. No distress.  HENT:  Head: Normocephalic and atraumatic.  Eyes: Conjunctivae and EOM are normal. Pupils are equal, round, and reactive to light.  Neck: Normal range of motion. Neck supple. No thyromegaly present.  Cardiovascular: Normal rate, regular rhythm, normal heart sounds and intact distal pulses.   No murmur heard. Pulmonary/Chest: Effort normal and breath sounds normal. No respiratory distress.  Abdominal: Soft. She exhibits no distension. There is no tenderness.  Musculoskeletal: She exhibits no edema.  Lymphadenopathy:    She has no cervical adenopathy.  Neurological: She is alert and oriented to person, place, and time.  Skin: Skin is warm and dry.  Psychiatric: She has a normal mood and affect. Her behavior is normal.  Vitals reviewed.         Assessment & Plan:

## 2017-02-20 NOTE — Patient Instructions (Addendum)
Follow up in 6 months to recheck cholesterol We'll notify you of your lab results and make any changes if needed Continue to work on healthy diet and regular exercise- you're doing great!! Use the cream twice daily as needed on the hemorrhoids You are due for repeat colonoscopy next year You are up to date on [place]!!! Call with any questions or concerns Happy Mother's Day!!!   Bring a copy of your advance directives to your next office visit.  Continue doing brain stimulating activities (puzzles, reading, adult coloring books, staying active) to keep memory sharp.   Continue to eat heart healthy diet (full of fruits, vegetables, whole grains, lean protein, water--limit salt, fat, and sugar intake) and increase physical activity as tolerated.  Shingrix Vaccine   Health Maintenance, Female Adopting a healthy lifestyle and getting preventive care can go a long way to promote health and wellness. Talk with your health care provider about what schedule of regular examinations is right for you. This is a good chance for you to check in with your provider about disease prevention and staying healthy. In between checkups, there are plenty of things you can do on your own. Experts have done a lot of research about which lifestyle changes and preventive measures are most likely to keep you healthy. Ask your health care provider for more information. Weight and diet Eat a healthy diet  Be sure to include plenty of vegetables, fruits, low-fat dairy products, and lean protein.  Do not eat a lot of foods high in solid fats, added sugars, or salt.  Get regular exercise. This is one of the most important things you can do for your health.  Most adults should exercise for at least 150 minutes each week. The exercise should increase your heart rate and make you sweat (moderate-intensity exercise).  Most adults should also do strengthening exercises at least twice a week. This is in addition to  the moderate-intensity exercise. Maintain a healthy weight  Body mass index (BMI) is a measurement that can be used to identify possible weight problems. It estimates body fat based on height and weight. Your health care provider can help determine your BMI and help you achieve or maintain a healthy weight.  For females 110 years of age and older:  A BMI below 18.5 is considered underweight.  A BMI of 18.5 to 24.9 is normal.  A BMI of 25 to 29.9 is considered overweight.  A BMI of 30 and above is considered obese. Watch levels of cholesterol and blood lipids  You should start having your blood tested for lipids and cholesterol at 69 years of age, then have this test every 5 years.  You may need to have your cholesterol levels checked more often if:  Your lipid or cholesterol levels are high.  You are older than 69 years of age.  You are at high risk for heart disease. Cancer screening Lung Cancer  Lung cancer screening is recommended for adults 73-14 years old who are at high risk for lung cancer because of a history of smoking.  A yearly low-dose CT scan of the lungs is recommended for people who:  Currently smoke.  Have quit within the past 15 years.  Have at least a 30-pack-year history of smoking. A pack year is smoking an average of one pack of cigarettes a day for 1 year.  Yearly screening should continue until it has been 15 years since you quit.  Yearly screening should stop if you develop  a health problem that would prevent you from having lung cancer treatment. Breast Cancer  Practice breast self-awareness. This means understanding how your breasts normally appear and feel.  It also means doing regular breast self-exams. Let your health care provider know about any changes, no matter how small.  If you are in your 20s or 30s, you should have a clinical breast exam (CBE) by a health care provider every 1-3 years as part of a regular health exam.  If you are 80  or older, have a CBE every year. Also consider having a breast X-ray (mammogram) every year.  If you have a family history of breast cancer, talk to your health care provider about genetic screening.  If you are at high risk for breast cancer, talk to your health care provider about having an MRI and a mammogram every year.  Breast cancer gene (BRCA) assessment is recommended for women who have family members with BRCA-related cancers. BRCA-related cancers include:  Breast.  Ovarian.  Tubal.  Peritoneal cancers.  Results of the assessment will determine the need for genetic counseling and BRCA1 and BRCA2 testing. Cervical Cancer  Your health care provider may recommend that you be screened regularly for cancer of the pelvic organs (ovaries, uterus, and vagina). This screening involves a pelvic examination, including checking for microscopic changes to the surface of your cervix (Pap test). You may be encouraged to have this screening done every 3 years, beginning at age 87.  For women ages 44-65, health care providers may recommend pelvic exams and Pap testing every 3 years, or they may recommend the Pap and pelvic exam, combined with testing for human papilloma virus (HPV), every 5 years. Some types of HPV increase your risk of cervical cancer. Testing for HPV may also be done on women of any age with unclear Pap test results.  Other health care providers may not recommend any screening for nonpregnant women who are considered low risk for pelvic cancer and who do not have symptoms. Ask your health care provider if a screening pelvic exam is right for you.  If you have had past treatment for cervical cancer or a condition that could lead to cancer, you need Pap tests and screening for cancer for at least 20 years after your treatment. If Pap tests have been discontinued, your risk factors (such as having a new sexual partner) need to be reassessed to determine if screening should resume.  Some women have medical problems that increase the chance of getting cervical cancer. In these cases, your health care provider may recommend more frequent screening and Pap tests. Colorectal Cancer  This type of cancer can be detected and often prevented.  Routine colorectal cancer screening usually begins at 69 years of age and continues through 69 years of age.  Your health care provider may recommend screening at an earlier age if you have risk factors for colon cancer.  Your health care provider may also recommend using home test kits to check for hidden blood in the stool.  A small camera at the end of a tube can be used to examine your colon directly (sigmoidoscopy or colonoscopy). This is done to check for the earliest forms of colorectal cancer.  Routine screening usually begins at age 63.  Direct examination of the colon should be repeated every 5-10 years through 69 years of age. However, you may need to be screened more often if early forms of precancerous polyps or small growths are found. Skin Cancer  Check your skin from head to toe regularly.  Tell your health care provider about any new moles or changes in moles, especially if there is a change in a mole's shape or color.  Also tell your health care provider if you have a mole that is larger than the size of a pencil eraser.  Always use sunscreen. Apply sunscreen liberally and repeatedly throughout the day.  Protect yourself by wearing long sleeves, pants, a wide-brimmed hat, and sunglasses whenever you are outside. Heart disease, diabetes, and high blood pressure  High blood pressure causes heart disease and increases the risk of stroke. High blood pressure is more likely to develop in:  People who have blood pressure in the high end of the normal range (130-139/85-89 mm Hg).  People who are overweight or obese.  People who are African American.  If you are 26-80 years of age, have your blood pressure checked  every 3-5 years. If you are 64 years of age or older, have your blood pressure checked every year. You should have your blood pressure measured twice-once when you are at a hospital or clinic, and once when you are not at a hospital or clinic. Record the average of the two measurements. To check your blood pressure when you are not at a hospital or clinic, you can use:  An automated blood pressure machine at a pharmacy.  A home blood pressure monitor.  If you are between 72 years and 56 years old, ask your health care provider if you should take aspirin to prevent strokes.  Have regular diabetes screenings. This involves taking a blood sample to check your fasting blood sugar level.  If you are at a normal weight and have a low risk for diabetes, have this test once every three years after 69 years of age.  If you are overweight and have a high risk for diabetes, consider being tested at a younger age or more often. Preventing infection Hepatitis B  If you have a higher risk for hepatitis B, you should be screened for this virus. You are considered at high risk for hepatitis B if:  You were born in a country where hepatitis B is common. Ask your health care provider which countries are considered high risk.  Your parents were born in a high-risk country, and you have not been immunized against hepatitis B (hepatitis B vaccine).  You have HIV or AIDS.  You use needles to inject street drugs.  You live with someone who has hepatitis B.  You have had sex with someone who has hepatitis B.  You get hemodialysis treatment.  You take certain medicines for conditions, including cancer, organ transplantation, and autoimmune conditions. Hepatitis C  Blood testing is recommended for:  Everyone born from [birth year] through 1963.  Anyone with known risk factors for hepatitis C. Sexually transmitted infections (STIs)  You should be screened for sexually transmitted infections (STIs) including  gonorrhea and chlamydia if:  You are sexually active and are younger than 69 years of age.  You are older than 69 years of age and your health care provider tells you that you are at risk for this type of infection.  Your sexual activity has changed since you were last screened and you are at an increased risk for chlamydia or gonorrhea. Ask your health care provider if you are at risk.  If you do not have HIV, but are at risk, it may be recommended that you take a prescription medicine daily to  prevent HIV infection. This is called pre-exposure prophylaxis (PrEP). You are considered at risk if:  You are sexually active and do not regularly use condoms or know the HIV status of your partner(s).  You take drugs by injection.  You are sexually active with a partner who has HIV. Talk with your health care provider about whether you are at high risk of being infected with HIV. If you choose to begin PrEP, you should first be tested for HIV. You should then be tested every 3 months for as long as you are taking PrEP. Pregnancy  If you are premenopausal and you may become pregnant, ask your health care provider about preconception counseling.  If you may become pregnant, take 400 to 800 micrograms (mcg) of folic acid every day.  If you want to prevent pregnancy, talk to your health care provider about birth control (contraception). Osteoporosis and menopause  Osteoporosis is a disease in which the bones lose minerals and strength with aging. This can result in serious bone fractures. Your risk for osteoporosis can be identified using a bone density scan.  If you are 69 years of age or older, or if you are at risk for osteoporosis and fractures, ask your health care provider if you should be screened.  Ask your health care provider whether you should take a calcium or vitamin D supplement to lower your risk for osteoporosis.  Menopause may have certain physical symptoms and risks.  Hormone  replacement therapy may reduce some of these symptoms and risks. Talk to your health care provider about whether hormone replacement therapy is right for you. Follow these instructions at home:  Schedule regular health, dental, and eye exams.  Stay current with your immunizations.  Do not use any tobacco products including cigarettes, chewing tobacco, or electronic cigarettes.  If you are pregnant, do not drink alcohol.  If you are breastfeeding, limit how much and how often you drink alcohol.  Limit alcohol intake to no more than 1 drink per day for nonpregnant women. One drink equals 12 ounces of beer, 5 ounces of wine, or 1 ounces of hard liquor.  Do not use street drugs.  Do not share needles.  Ask your health care provider for help if you need support or information about quitting drugs.  Tell your health care provider if you often feel depressed.  Tell your health care provider if you have ever been abused or do not feel safe at home. This information is not intended to replace advice given to you by your health care provider. Make sure you discuss any questions you have with your health care provider. Document Released: 04/16/2011 Document Revised: 03/08/2016 Document Reviewed: 07/05/2015 Elsevier Interactive Patient Education  2017 [employer].

## 2017-02-21 ENCOUNTER — Encounter: Payer: Self-pay | Admitting: General Practice

## 2017-02-21 LAB — VITAMIN D 25 HYDROXY (VIT D DEFICIENCY, FRACTURES): VIT D 25 HYDROXY: 38 ng/mL (ref 30–100)

## 2017-02-21 NOTE — Assessment & Plan Note (Signed)
Pt has hx of this.  On daily supplement.  Check labs.  Replete prn.

## 2017-02-21 NOTE — Assessment & Plan Note (Signed)
Chronic problem.  Currently diet controlled.  Pt is exercising regularly.  Applauded her efforts.  Check labs.  Start meds prn.

## 2017-02-21 NOTE — Assessment & Plan Note (Signed)
Recurrent problem for pt.  Internal hemorrhoids banded by GI, external hemorrhoids wax and wane.  Refill provided on Proctozone w/ encouragement to f/u w/ GI if no improvement.  Pt expressed understanding and is in agreement w/ plan.

## 2017-03-22 DIAGNOSIS — L814 Other melanin hyperpigmentation: Secondary | ICD-10-CM | POA: Diagnosis not present

## 2017-03-22 DIAGNOSIS — Z85828 Personal history of other malignant neoplasm of skin: Secondary | ICD-10-CM | POA: Diagnosis not present

## 2017-03-22 DIAGNOSIS — L821 Other seborrheic keratosis: Secondary | ICD-10-CM | POA: Diagnosis not present

## 2017-03-22 DIAGNOSIS — L570 Actinic keratosis: Secondary | ICD-10-CM | POA: Diagnosis not present

## 2017-04-01 DIAGNOSIS — H00015 Hordeolum externum left lower eyelid: Secondary | ICD-10-CM | POA: Diagnosis not present

## 2017-04-09 DIAGNOSIS — H00015 Hordeolum externum left lower eyelid: Secondary | ICD-10-CM | POA: Diagnosis not present

## 2017-06-05 NOTE — Progress Notes (Signed)
71 y.o. G2P2 Divorced Caucasian F here for annual exam.  Going to [place] for three weeks with friend.  Denies vaginal bleeding.  Having some vaginal dryness.  Does not want to be on hormonal treatment, even vaginal estrogen creams.    Worried about IBS symptoms while traveling.  Seems to be an issues more often than not with traveling.  No diarrhea usually but a lot of pain.  Hasn't really tried much.  Would like some suggestions.    PCP:  Dr. Birdie Riddle.  She followed her to [place].  Blood work done 5/18.  Pt had first pneumonia vaccination.  She has also completed the Shingrix series.  No LMP recorded. Patient is postmenopausal.          Sexually active: No.  The current method of family planning is post menopausal status.    Exercising: Yes.    walking Smoker:  no  Health Maintenance: Pap:  01/05/15 negative, 12/25/12 negative, HR HPV negative  History of abnormal Pap:  no MMG:  11/13/16 BIRADS 1 negative  Colonoscopy:  09/15/08 repeat 10 years  BMD:   08/31/13 repeat 5 years  TDaP:  03/12/12 Pneumonia vaccine(s):  02/20/17 Zostavax:   05/02/09, has completed   Hep C testing: 02/27/16 negative  Screening Labs: PCP, Hb today: PCP   reports that she has never smoked. She has never used smokeless tobacco. She reports that she drinks about 0.6 oz of alcohol per week . She reports that she does not use drugs.  Past Medical History:  Diagnosis Date  . Anaphylaxis   . Anxiety   . Arthritis   . Cough   . Depression    Dr. Abner Greenspan  . Exposure to hepatitis C   . GERD (gastroesophageal reflux disease)   . Hyperlipidemia   . Hypertension   . Internal and external bleeding hemorrhoids   . Migraine   . PONV (postoperative nausea and vomiting)   . Rosacea   . Sleep apnea    ??    Past Surgical History:  Procedure Laterality Date  . ABDOMINOPLASTY     around 2000  . APPENDECTOMY  2004  . [place]   x 2  . COLONOSCOPY  09/15/2008   internal and external hemorroids   . COSMETIC SURGERY     multiple  . HEMORRHOID BANDING  2014  . TONSILLECTOMY  1962  . TOTAL KNEE ARTHROPLASTY Right 12/26/2015   Procedure: TOTAL KNEE ARTHROPLASTY;  Surgeon: Paralee Cancel, MD;  Location: [facility];  Service: Orthopedics;  Laterality: Right;  . TOTAL SHOULDER REPLACEMENT  2009   right    Current Outpatient Prescriptions  Medication Sig Dispense Refill  . b complex vitamins tablet Take 1 tablet by mouth daily.    Marland Kitchen buPROPion (WELLBUTRIN XL) 300 MG 24 hr tablet Take 300 mg by mouth daily.     . cetirizine (ZYRTEC) 10 MG tablet Take 10 mg by mouth daily as needed for allergies. Reported on 02/27/2016    . cholecalciferol (VITAMIN D) 1000 units tablet Take 1,000 Units by mouth daily.    Marland Kitchen eletriptan (RELPAX) 20 MG tablet Take 1 tablet (20 mg total) by mouth as needed. may repeat in 2 hours if necessary 10 tablet 2  . EPINEPHrine (EPIPEN 2-PAK) 0.3 mg/0.3 mL IJ SOAJ injection INJECT 0.3 MLS (0.3 MG TOTAL) INTO THE MUSCLE ONCE. 2 Device 1  . escitalopram (LEXAPRO) 20 MG tablet Take 20 mg by mouth daily.    Marland Kitchen esomeprazole ([place])  20 MG capsule Take 20 mg by mouth daily at 12 noon.    . hydrocortisone (PROCTOZONE-HC) 2.5 % rectal cream Place 1 application rectally 2 (two) times daily. 30 g 3  . ibuprofen (ADVIL,MOTRIN) 200 MG tablet Take by mouth.    . polyvinyl alcohol (LIQUIFILM TEARS) 1.4 % ophthalmic solution Place 1 drop into both eyes 2 (two) times daily as needed for dry eyes. Reported on 02/27/2016     No current facility-administered medications for this visit.     Family History  Problem Relation Age of Onset  . Hyperlipidemia Mother   . Heart disease Mother   . Diabetes Father   . Hypertension Father   . Heart attack Father        51s  . Heart disease Father   . Obesity Sister   . Kidney disease Sister   . Colon cancer Neg Hx     ROS:  Pertinent items are noted in HPI.  Otherwise, a comprehensive ROS was negative.  Exam:   BP 116/76 (BP Location: Right Arm,  Patient Position: Sitting, Cuff Size: Normal)   Pulse 68   Resp 14   Ht 5' 0.5" (1.537 m)   Wt 158 lb (71.7 kg)   BMI 30.35 kg/m     Height: 5' 0.5" (153.7 cm)  Ht Readings from Last 3 Encounters:  06/06/17 5' 0.5" (1.537 m)  02/20/17 5\' 1"  (1.549 m)  02/27/16 5' 0.75" (1.543 m)    General appearance: alert, cooperative and appears stated age Head: Normocephalic, without obvious abnormality, atraumatic Neck: no adenopathy, supple, symmetrical, trachea midline and thyroid normal to inspection and palpation Lungs: clear to auscultation bilaterally Breasts: normal appearance, no masses or tenderness, bilateral implants Heart: regular rate and rhythm Abdomen: soft, non-tender; bowel sounds normal; no masses,  no organomegaly Extremities: extremities normal, atraumatic, no cyanosis or edema Skin: Skin color, texture, turgor normal. No rashes or lesions Lymph nodes: Cervical, supraclavicular, and axillary nodes normal. No abnormal inguinal nodes palpated Neurologic: Grossly normal   Pelvic: External genitalia:  no lesions              Urethra:  normal appearing urethra with no masses, tenderness or lesions              Bartholins and Skenes: normal                 Vagina: normal appearing vagina with normal color and discharge, no lesions              Cervix: no lesions              Pap taken: Yes.   Bimanual Exam:  Uterus:  normal size, contour, position, consistency, mobility, non-tender              Adnexa: normal adnexa and no mass, fullness, tenderness               Rectovaginal: Confirms               Anus:  normal sphincter tone, no lesions  Chaperone was present for exam.  A:  Well Woman with normal exam PMP, no HRT Breast implants, bilaterally IBS H/O depression Vaginal dryness Arthritis Hyperlipidemia H/O anaphylaxis from no clearly known cause--carries epipen everywhere  P:   Mammogram guidelines reviewed pap smear obtained today Trial of hyosciamine 0.125mg   SL every 4 hours prn (#30/0RF to pharmacy) and recommended probiotic like Align before and while traveling.  Colonoscopy is due next year.  Pt aware. Plan BMD next year. D/w pt possible Vit E vaginal suppositories for vaginal dryness.  She will let me know if desires to try this. return annually or prn

## 2017-06-06 ENCOUNTER — Ambulatory Visit (INDEPENDENT_AMBULATORY_CARE_PROVIDER_SITE_OTHER): Payer: Medicare Other | Admitting: Obstetrics & Gynecology

## 2017-06-06 ENCOUNTER — Other Ambulatory Visit (HOSPITAL_COMMUNITY)
Admission: RE | Admit: 2017-06-06 | Discharge: 2017-06-06 | Disposition: A | Discharge status: Home / Self Care | Payer: Medicare Other | Source: Ambulatory Visit | Attending: Obstetrics & Gynecology | Admitting: Obstetrics & Gynecology

## 2017-06-06 ENCOUNTER — Encounter: Payer: Self-pay | Admitting: Obstetrics & Gynecology

## 2017-06-06 VITALS — BP 116/76 | HR 68 | Resp 14 | Ht 60.5 in | Wt 158.0 lb

## 2017-06-06 DIAGNOSIS — Z01419 Encounter for gynecological examination (general) (routine) without abnormal findings: Secondary | ICD-10-CM | POA: Diagnosis not present

## 2017-06-06 DIAGNOSIS — Z124 Encounter for screening for malignant neoplasm of cervix: Secondary | ICD-10-CM

## 2017-06-06 MED ORDER — HYOSCYAMINE SULFATE 0.125 MG SL SUBL: 0.1250 mg | SUBLINGUAL_TABLET | SUBLINGUAL | 0 refills | Status: DC | PRN

## 2017-06-06 NOTE — Patient Instructions (Signed)
Probiotic:  Align.  Take daily.

## 2017-06-07 LAB — CYTOLOGY - PAP: Diagnosis: NEGATIVE

## 2017-07-28 DIAGNOSIS — Z23 Encounter for immunization: Secondary | ICD-10-CM | POA: Diagnosis not present

## 2017-08-07 ENCOUNTER — Telehealth: Payer: Self-pay | Admitting: Family Medicine

## 2017-08-07 NOTE — Telephone Encounter (Signed)
Noted  

## 2017-08-07 NOTE — Telephone Encounter (Signed)
Patient scheduled with Elyn Aquas, PA-C on 08/08/17 at 10am.   Routed to provider just as FYI.

## 2017-08-07 NOTE — Telephone Encounter (Signed)
[place] Patient Name: Cassandra Little DOB: 05/28/1946 Initial Comment caller states she has a severe headache and congestion. Nurse Assessment Nurse: Martyn Ehrich, RN, Felicia Date/Time (Eastern Time): 08/07/2017 3:58:03 PM Confirm and document reason for call. If symptomatic, describe symptoms. ---Pti s having a bad HA and Congestion - terrible sinus HA and in throat and ears. Started yesterday. No fever Does the patient have any new or worsening symptoms? ---Yes Will a triage be completed? ---Yes Related visit to physician within the last 2 weeks? ---No Does the PT have any chronic conditions? (i.e. diabetes, asthma, etc.) ---No Is this a behavioral health or substance abuse call? ---No Guidelines Guideline Title Affirmed Question Affirmed NotesSinus Pain or Congestion Earache Final Disposition User See Physician within 24 Hours Gaddy, RN, Felicia Referrals REFERRED TO PCP OFFICE Caller Disagree/Comply Comply Caller Understands Yes PreDisposition Did not know what to do Call Id: [id number]

## 2017-08-08 ENCOUNTER — Ambulatory Visit: Payer: Self-pay | Admitting: Physician Assistant

## 2017-10-16 ENCOUNTER — Other Ambulatory Visit: Payer: Self-pay | Admitting: General Practice

## 2017-10-16 DIAGNOSIS — K644 Residual hemorrhoidal skin tags: Secondary | ICD-10-CM

## 2017-10-16 MED ORDER — HYDROCORTISONE 2.5 % RE CREA: 1.0000 "application " | TOPICAL_CREAM | Freq: Two times a day (BID) | RECTAL | 0 refills | Status: DC

## 2017-10-21 ENCOUNTER — Other Ambulatory Visit: Payer: Self-pay | Admitting: Obstetrics & Gynecology

## 2017-10-21 DIAGNOSIS — Z1231 Encounter for screening mammogram for malignant neoplasm of breast: Secondary | ICD-10-CM

## 2017-11-14 ENCOUNTER — Ambulatory Visit
Admission: RE | Admit: 2017-11-14 | Discharge: 2017-11-14 | Disposition: A | Discharge status: Home / Self Care | Payer: Medicare Other | Source: Ambulatory Visit | Attending: Obstetrics & Gynecology | Admitting: Obstetrics & Gynecology

## 2017-11-14 DIAGNOSIS — Z1231 Encounter for screening mammogram for malignant neoplasm of breast: Secondary | ICD-10-CM

## 2017-12-12 ENCOUNTER — Ambulatory Visit: Payer: Self-pay | Admitting: *Deleted

## 2017-12-12 NOTE — Telephone Encounter (Signed)
Pt states she started experiencing headache,fever and cough since yesterday.  Pt is currently taking Mucinex DM and Aleve and has temp of 101. Pt advised to check temp again within the next hour to hour and half to see if it decreases. Pt advised that Tylenol can be used as an alterate to control temperature. Pt given home care advice and notified if no improvement in fever with current measures within the next 3 days to call the office back. Pt verbalized understanding.  Reason for Disposition . Cold with no complications  Answer Assessment - Initial Assessment Questions 1. ONSET: "When did the nasal discharge start?"      No runny nose 2. AMOUNT: "How much discharge is there?"      n/a 3. COUGH: "Do you have a cough?" If yes, ask: "Describe the color of your sputum" (clear, white, yellow, green)     Non productive cough 4. RESPIRATORY DISTRESS: "Describe your breathing."      No distressed noted 5. FEVER: "Do you have a fever?" If so, ask: "What is your temperature, how was it measured, and when did it start?"     101- oral thermometer 6. SEVERITY: "Overall, how bad are you feeling right now?" (e.g., doesn't interfere with normal activities, staying home from school/work, staying in bed)      Doesn't feel like being out driving 7. OTHER SYMPTOMS: "Do you have any other symptoms?" (e.g., sore throat, earache, wheezing, vomiting)     Cough and headache 8. PREGNANCY: "Is there any chance you are pregnant?" "When was your last menstrual period?"     n/a  Protocols used: COMMON COLD-A-AH

## 2017-12-17 ENCOUNTER — Telehealth: Payer: Self-pay | Admitting: Family Medicine

## 2017-12-17 NOTE — Telephone Encounter (Signed)
Copied from [place] [id number]. Topic: Quick Communication - See Telephone Encounter >> Dec 17, 2017  2:33 PM Synthia Innocent wrote: CRM for notification. See Telephone encounter for:  Would like to speak to nurse, would not elaborate on what. Just requesting call back.  12/17/17.

## 2017-12-18 NOTE — Telephone Encounter (Signed)
LM for patient to call back.  CRM created so that PEC can transfer to me when she calls.

## 2017-12-18 NOTE — Telephone Encounter (Signed)
Patient states that she just keeps getting this sinus infection over and over.  She states that she does not have one now, but she was not sure if she would need ABX next time.  Advised patient that if she started developing symptoms again that she would need to schedule an appointment to be seen.  Patient stated verbal understanding. She said that she hopes this was the last of it and that she does not get it again, but if she does she will call to schedule appointment to be seen.

## 2017-12-20 ENCOUNTER — Other Ambulatory Visit: Payer: Self-pay | Admitting: Family Medicine

## 2017-12-20 DIAGNOSIS — K644 Residual hemorrhoidal skin tags: Secondary | ICD-10-CM

## 2017-12-23 NOTE — Telephone Encounter (Signed)
[place] for this refill, but no refills w/o follow up appt as it has been 10 months since last app (was to have a cholesterol f/u in 6 months) and she has no appts scheduled

## 2017-12-23 NOTE — Telephone Encounter (Signed)
Ok to refill? Pt last saw you 02/2017 and did not follow up for cholesterol

## 2018-01-17 DIAGNOSIS — M79673 Pain in unspecified foot: Secondary | ICD-10-CM | POA: Diagnosis not present

## 2018-01-17 DIAGNOSIS — M19079 Primary osteoarthritis, unspecified ankle and foot: Secondary | ICD-10-CM | POA: Diagnosis not present

## 2018-01-17 DIAGNOSIS — M21619 Bunion of unspecified foot: Secondary | ICD-10-CM | POA: Diagnosis not present

## 2018-01-17 DIAGNOSIS — M7741 Metatarsalgia, right foot: Secondary | ICD-10-CM | POA: Diagnosis not present

## 2018-01-22 ENCOUNTER — Ambulatory Visit (INDEPENDENT_AMBULATORY_CARE_PROVIDER_SITE_OTHER): Payer: Medicare Other | Admitting: Orthopedic Surgery

## 2018-02-06 DIAGNOSIS — L814 Other melanin hyperpigmentation: Secondary | ICD-10-CM | POA: Diagnosis not present

## 2018-02-06 DIAGNOSIS — D1801 Hemangioma of skin and subcutaneous tissue: Secondary | ICD-10-CM | POA: Diagnosis not present

## 2018-02-06 DIAGNOSIS — L738 Other specified follicular disorders: Secondary | ICD-10-CM | POA: Diagnosis not present

## 2018-02-06 DIAGNOSIS — L570 Actinic keratosis: Secondary | ICD-10-CM | POA: Diagnosis not present

## 2018-02-06 DIAGNOSIS — L821 Other seborrheic keratosis: Secondary | ICD-10-CM | POA: Diagnosis not present

## 2018-02-06 DIAGNOSIS — Z85828 Personal history of other malignant neoplasm of skin: Secondary | ICD-10-CM | POA: Diagnosis not present

## 2018-03-17 ENCOUNTER — Other Ambulatory Visit: Payer: Self-pay | Admitting: Family Medicine

## 2018-03-17 DIAGNOSIS — K644 Residual hemorrhoidal skin tags: Secondary | ICD-10-CM

## 2018-08-05 ENCOUNTER — Telehealth: Payer: Self-pay | Admitting: Family Medicine

## 2018-08-05 NOTE — Telephone Encounter (Signed)
Noted. Pt needs OV.   Copied from [place] [phone]. Topic: General - Other >> Aug 05, 2018  3:28 PM Margot Ables wrote: Reason for CRM: pt called to request order for colonoscopy. Advised pt last OV with Dr. Birdie Riddle 02/2017 and offered to schedule. Pt declined and said she would contact GYN Dr. Sabra Heck to see if they can order the colonoscopy for her.

## 2018-08-07 ENCOUNTER — Ambulatory Visit: Payer: Medicare Other | Admitting: Obstetrics & Gynecology

## 2018-08-15 ENCOUNTER — Ambulatory Visit (INDEPENDENT_AMBULATORY_CARE_PROVIDER_SITE_OTHER): Payer: Medicare Other | Admitting: Obstetrics & Gynecology

## 2018-08-15 ENCOUNTER — Other Ambulatory Visit: Payer: Self-pay

## 2018-08-15 ENCOUNTER — Encounter: Payer: Self-pay | Admitting: Obstetrics & Gynecology

## 2018-08-15 VITALS — BP 116/80 | HR 88 | Resp 16 | Ht 60.25 in | Wt 163.4 lb

## 2018-08-15 DIAGNOSIS — E2839 Other primary ovarian failure: Secondary | ICD-10-CM | POA: Diagnosis not present

## 2018-08-15 DIAGNOSIS — Z3141 Encounter for fertility testing: Secondary | ICD-10-CM | POA: Diagnosis not present

## 2018-08-15 DIAGNOSIS — Z01419 Encounter for gynecological examination (general) (routine) without abnormal findings: Secondary | ICD-10-CM | POA: Diagnosis not present

## 2018-08-15 DIAGNOSIS — Z0000 Encounter for general adult medical examination without abnormal findings: Secondary | ICD-10-CM | POA: Diagnosis not present

## 2018-08-15 DIAGNOSIS — E559 Vitamin D deficiency, unspecified: Secondary | ICD-10-CM | POA: Diagnosis not present

## 2018-08-15 DIAGNOSIS — Z124 Encounter for screening for malignant neoplasm of cervix: Secondary | ICD-10-CM | POA: Diagnosis not present

## 2018-08-15 DIAGNOSIS — R790 Abnormal level of blood mineral: Secondary | ICD-10-CM | POA: Diagnosis not present

## 2018-08-15 DIAGNOSIS — Z833 Family history of diabetes mellitus: Secondary | ICD-10-CM | POA: Diagnosis not present

## 2018-08-15 DIAGNOSIS — Z6830 Body mass index (BMI) 30.0-30.9, adult: Secondary | ICD-10-CM | POA: Diagnosis not present

## 2018-08-15 MED ORDER — EPINEPHRINE 0.3 MG/0.3ML IJ SOAJ: INTRAMUSCULAR | 1 refills | Status: DC

## 2018-08-15 NOTE — Patient Instructions (Signed)
[facility] [address] [place], [place] [address]  Main: [phone]  Dr. Sherri Rad, [place]

## 2018-08-15 NOTE — Progress Notes (Signed)
72 y.o. G2P2 Divorced White or Caucasian female here for annual exam.  Doing well.  Denies vaginal bleeding.    PCP: Dr. Bennye Alm.  Has trouble seeing her regularly due to distance she must drive for appts.    No LMP recorded. Patient is postmenopausal.          Sexually active: No.  The current method of family planning is post menopausal status.    Exercising: Yes.    yoga, pilates Smoker:  no  Health Maintenance: Pap:  06/06/17 neg   01/05/15 Neg  History of abnormal Pap:  no MMG:  11/14/17 BIRADS1:neg  Colonoscopy:  09/2008 F/u 10 years. Pt will call to schedule.  Dr. Carlean Purl.  BMD:   08/31/13 TDaP:  2013 Pneumonia vaccine(s):  2018 Shingrix:   Completed  Hep C testing: 02/27/16 neg  Screening Labs: Here today   reports that she has never smoked. She has never used smokeless tobacco. She reports that she drinks about 1.0 standard drinks of alcohol per week. She reports that she does not use drugs.  Past Medical History:  Diagnosis Date  . Anxiety   . Arthritis   . Cough   . Depression    Dr. Abner Greenspan  . Exposure to hepatitis C   . GERD (gastroesophageal reflux disease)   . Hyperlipidemia   . Hypertension   . Internal and external bleeding hemorrhoids   . Migraine   . PONV (postoperative nausea and vomiting)   . Rosacea   . Sleep apnea     Past Surgical History:  Procedure Laterality Date  . ABDOMINOPLASTY  2000  . APPENDECTOMY  2004  . AUGMENTATION MAMMAPLASTY Bilateral 2011  . [place]   x 2  . COLONOSCOPY  09/15/2008   internal and external hemorroids  . COSMETIC SURGERY     multiple  . HEMORRHOID BANDING  2014  . REDUCTION MAMMAPLASTY Bilateral 2002  . TONSILLECTOMY  1962  . TOTAL KNEE ARTHROPLASTY Right 12/26/2015   Procedure: TOTAL KNEE ARTHROPLASTY;  Surgeon: Paralee Cancel, MD;  Location: [facility];  Service: Orthopedics;  Laterality: Right;  . TOTAL SHOULDER REPLACEMENT  2009   right    Current Outpatient Medications  Medication Sig  Dispense Refill  . b complex vitamins tablet Take 1 tablet by mouth daily.    Marland Kitchen buPROPion (WELLBUTRIN XL) 300 MG 24 hr tablet Take 300 mg by mouth daily.     . cetirizine (ZYRTEC) 10 MG tablet Take 10 mg by mouth daily as needed for allergies. Reported on 02/27/2016    . cholecalciferol (VITAMIN D) 1000 units tablet Take 1,000 Units by mouth daily.    Marland Kitchen eletriptan (RELPAX) 20 MG tablet Take 1 tablet (20 mg total) by mouth as needed. may repeat in 2 hours if necessary 10 tablet 2  . EPINEPHrine (EPIPEN 2-PAK) 0.3 mg/0.3 mL IJ SOAJ injection INJECT 0.3 MLS (0.3 MG TOTAL) INTO THE MUSCLE ONCE. 2 Device 1  . escitalopram (LEXAPRO) 20 MG tablet Take 20 mg by mouth daily.    Marland Kitchen esomeprazole (NEXIUM) 20 MG capsule Take 20 mg by mouth daily at 12 noon.    . hydrocortisone (ANUSOL-HC) 2.5 % rectal cream APPLY RECTALLY TWICE A DAY 30 g 0  . hyoscyamine (LEVSIN SL) 0.125 MG SL tablet Place 1 tablet (0.125 mg total) under the tongue every 4 (four) hours as needed. 30 tablet 0  . ibuprofen (ADVIL,MOTRIN) 200 MG tablet Take by mouth.    . polyvinyl alcohol (LIQUIFILM  TEARS) 1.4 % ophthalmic solution Place 1 drop into both eyes 2 (two) times daily as needed for dry eyes. Reported on 02/27/2016     No current facility-administered medications for this visit.     Family History  Problem Relation Age of Onset  . Hyperlipidemia Mother   . Heart disease Mother   . Diabetes Father   . Hypertension Father   . Heart attack Father        1s  . Heart disease Father   . Obesity Sister   . Kidney disease Sister   . Colon cancer Neg Hx     Review of Systems  Musculoskeletal: Positive for myalgias.  All other systems reviewed and are negative.   Exam:   BP 116/80 (BP Location: Right Arm, Patient Position: Sitting, Cuff Size: Large)   Pulse 88   Resp 16   Ht 5' 0.25" (1.53 m)   Wt 163 lb 6.4 oz (74.1 kg)   BMI 31.65 kg/m     Height: 5' 0.25" (153 cm)  Ht Readings from Last 3 Encounters:  08/15/18 5'  0.25" (1.53 m)  06/06/17 5' 0.5" (1.537 m)  02/20/17 5\' 1"  (1.549 m)    General appearance: alert, cooperative and appears stated age Head: Normocephalic, without obvious abnormality, atraumatic Neck: no adenopathy, supple, symmetrical, trachea midline and thyroid normal to inspection and palpation Lungs: clear to auscultation bilaterally Breasts: normal appearance, no masses or tenderness Heart: regular rate and rhythm Abdomen: soft, non-tender; bowel sounds normal; no masses,  no organomegaly Extremities: extremities normal, atraumatic, no cyanosis or edema Skin: Skin color, texture, turgor normal. No rashes or lesions Lymph nodes: Cervical, supraclavicular, and axillary nodes normal. No abnormal inguinal nodes palpated Neurologic: Grossly normal   Pelvic: External genitalia:  no lesions              Urethra:  normal appearing urethra with no masses, tenderness or lesions              Bartholins and Skenes: normal                 Vagina: normal appearing vagina with normal color and discharge, no lesions              Cervix: no lesions              Pap taken: No. Bimanual Exam:  Uterus:  normal size, contour, position, consistency, mobility, non-tender              Adnexa: normal adnexa and no mass, fullness, tenderness               Rectovaginal: Confirms               Anus:  normal sphincter tone, no lesions  Chaperone was present for exam.  A:  Well Woman with normal exam PMP, no HRT Breast implants IBS H/o depression Atrophic chantes Arthritis Hyperlipidemia  P:   Mammogram guidelines reviewed pap smear not indicated.  Done 2018. Colonoscopy due.  Pt planning on seeing Dr. Carlean Purl. Lab work obtained today:  CBC, CMP, lipids, TSH, Vit D Rx for epipen given as hers has expired BMD order placed.  She will plan to do this with her next MMG. return annually or prn

## 2018-08-16 LAB — COMPREHENSIVE METABOLIC PANEL
ALBUMIN: 4.2 g/dL (ref 3.5–4.8)
ALK PHOS: 81 IU/L (ref 39–117)
ALT: 19 IU/L (ref 0–32)
AST: 20 IU/L (ref 0–40)
Albumin/Globulin Ratio: 1.8 (ref 1.2–2.2)
BUN / CREAT RATIO: 30 — AB (ref 12–28)
BUN: 28 mg/dL — AB (ref 8–27)
Bilirubin Total: <0.2 mg/dL (ref 0.0–1.2)
CALCIUM: 9.3 mg/dL (ref 8.7–10.3)
CO2: 24 mmol/L (ref 20–29)
CREATININE: 0.92 mg/dL (ref 0.57–1.00)
Chloride: 101 mmol/L (ref 96–106)
GFR calc Af Amer: 73 mL/min/{1.73_m2} (ref >59)
GFR, EST NON AFRICAN AMERICAN: 63 mL/min/{1.73_m2} (ref >59)
GLUCOSE: 91 mg/dL (ref 65–99)
Globulin, Total: 2.4 g/dL (ref 1.5–4.5)
Potassium: 4.2 mmol/L (ref 3.5–5.2)
Sodium: 139 mmol/L (ref 134–144)
TOTAL PROTEIN: 6.6 g/dL (ref 6.0–8.5)

## 2018-08-16 LAB — VITAMIN D 25 HYDROXY (VIT D DEFICIENCY, FRACTURES): Vit D, 25-Hydroxy: 37.5 ng/mL (ref 30.0–100.0)

## 2018-08-16 LAB — LIPID PANEL
CHOLESTEROL TOTAL: 195 mg/dL (ref 100–199)
Chol/HDL Ratio: 3.6 ratio (ref 0.0–4.4)
HDL: 54 mg/dL (ref >39)
LDL Calculated: 120 mg/dL — ABNORMAL HIGH (ref 0–99)
TRIGLYCERIDES: 106 mg/dL (ref 0–149)
VLDL CHOLESTEROL CAL: 21 mg/dL (ref 5–40)

## 2018-08-16 LAB — CBC
Hematocrit: 29.1 % — ABNORMAL LOW (ref 34.0–46.6)
Hemoglobin: 8.8 g/dL — ABNORMAL LOW (ref 11.1–15.9)
MCH: 22.6 pg — AB (ref 26.6–33.0)
MCHC: 30.2 g/dL — ABNORMAL LOW (ref 31.5–35.7)
MCV: 75 fL — AB (ref 79–97)
Platelets: 278 10*3/uL (ref 150–450)
RBC: 3.89 x10E6/uL (ref 3.77–5.28)
RDW: 15.8 % — AB (ref 12.3–15.4)
WBC: 7.3 10*3/uL (ref 3.4–10.8)

## 2018-08-16 LAB — TSH: TSH: 2.9 u[IU]/mL (ref 0.450–4.500)

## 2018-08-17 ENCOUNTER — Encounter: Payer: Self-pay | Admitting: Obstetrics & Gynecology

## 2018-08-18 ENCOUNTER — Telehealth: Payer: Self-pay | Admitting: Obstetrics & Gynecology

## 2018-08-18 NOTE — Telephone Encounter (Signed)
Patient spoke with Cassandra Little earlier and has questions regarding results.

## 2018-08-18 NOTE — Telephone Encounter (Signed)
Left voice mail to call back 

## 2018-08-19 LAB — SPECIMEN STATUS REPORT

## 2018-08-19 LAB — IRON: Iron: 16 ug/dL — ABNORMAL LOW (ref 27–139)

## 2018-08-19 LAB — FERRITIN: FERRITIN: 6 ng/mL — AB (ref 15–150)

## 2018-08-19 NOTE — Telephone Encounter (Signed)
Patient called back. States she got an appt with Dr. Carlean Purl for 09/23/18.  Asking if she should be worry about results and if she needs to have an appt sooner than 12/10 with Dr. Carlean Purl.   Labs routed to Dr. Celesta Aver office today.   Advised patient I will call back if Dr. Sabra Heck thinks she needs to be seen sooner and with the rest of lab results.   Please advise.

## 2018-08-19 NOTE — Telephone Encounter (Signed)
Patient returning call.

## 2018-08-19 NOTE — Telephone Encounter (Signed)
Please let pt know her iron level and iron storage were really low as well.  I'd like her to start on Slow Fe three times weekly.  If tolerates and doesn't have constipation, increase to daily.  I think the appt in early December is fine unless she's seeing blood in her stool or having dark stools or anything that looks like coffee grounds in her stool.  Thanks.  Routing to triage to complete call to pt.  Add on iron levels also forwarded.  Thanks.

## 2018-08-19 NOTE — Telephone Encounter (Signed)
Patient is returning call to [place]. °

## 2018-08-20 NOTE — Telephone Encounter (Signed)
Spoke with patient, advised as seen below per Dr. Miller. Patient verbalizes understanding and is agreeable.   Encounter closed.  

## 2018-09-23 ENCOUNTER — Encounter: Payer: Self-pay | Admitting: Internal Medicine

## 2018-09-23 ENCOUNTER — Ambulatory Visit (INDEPENDENT_AMBULATORY_CARE_PROVIDER_SITE_OTHER): Payer: Medicare Other | Admitting: Internal Medicine

## 2018-09-23 ENCOUNTER — Other Ambulatory Visit (INDEPENDENT_AMBULATORY_CARE_PROVIDER_SITE_OTHER): Payer: Medicare Other

## 2018-09-23 VITALS — BP 130/90 | HR 84 | Ht 60.6 in | Wt 166.0 lb

## 2018-09-23 DIAGNOSIS — D509 Iron deficiency anemia, unspecified: Secondary | ICD-10-CM

## 2018-09-23 DIAGNOSIS — Z52008 Unspecified donor, other blood: Secondary | ICD-10-CM

## 2018-09-23 DIAGNOSIS — Z862 Personal history of diseases of the blood and blood-forming organs and certain disorders involving the immune mechanism: Secondary | ICD-10-CM | POA: Insufficient documentation

## 2018-09-23 DIAGNOSIS — Z87892 Personal history of anaphylaxis: Secondary | ICD-10-CM | POA: Diagnosis not present

## 2018-09-23 DIAGNOSIS — K219 Gastro-esophageal reflux disease without esophagitis: Secondary | ICD-10-CM | POA: Diagnosis not present

## 2018-09-23 LAB — CBC WITH DIFFERENTIAL/PLATELET
Basophils Absolute: 0.1 10*3/uL (ref 0.0–0.1)
Basophils Relative: 1 % (ref 0.0–3.0)
Eosinophils Absolute: 0.1 10*3/uL (ref 0.0–0.7)
Eosinophils Relative: 2.6 % (ref 0.0–5.0)
HCT: 33.1 % — ABNORMAL LOW (ref 36.0–46.0)
Hemoglobin: 10.5 g/dL — ABNORMAL LOW (ref 12.0–15.0)
LYMPHS ABS: 1.7 10*3/uL (ref 0.7–4.0)
Lymphocytes Relative: 31.6 % (ref 12.0–46.0)
MCHC: 31.6 g/dL (ref 30.0–36.0)
MCV: 72.3 fl — ABNORMAL LOW (ref 78.0–100.0)
MONOS PCT: 9.9 % (ref 3.0–12.0)
Monocytes Absolute: 0.5 10*3/uL (ref 0.1–1.0)
NEUTROS PCT: 54.9 % (ref 43.0–77.0)
Neutro Abs: 3 10*3/uL (ref 1.4–7.7)
Platelets: 263 10*3/uL (ref 150.0–400.0)
RBC: 4.58 Mil/uL (ref 3.87–5.11)
RDW: 19.7 % — ABNORMAL HIGH (ref 11.5–15.5)
WBC: 5.5 10*3/uL (ref 4.0–10.5)

## 2018-09-23 LAB — IGA: IgA: 193 mg/dL (ref 68–378)

## 2018-09-23 NOTE — Progress Notes (Signed)
Cassandra Little 72 y.o. [birth year] [id number]  Assessment & Plan:   Encounter Diagnoses  Name Primary?  . Iron deficiency anemia, unspecified iron deficiency anemia type Yes  . Laryngopharyngeal reflux (LPR)   . Blood donor   . History of anaphylaxis     Continue iron supplementation. Evaluate with EGD and colonoscopy and celiac tests.  Question contribution of blood donation to the iron deficiency, does not seem like enough of a cause to be the sole reason but may be. The risks and benefits as well as alternatives of endoscopic procedure(s) have been discussed and reviewed. All questions answered. The patient agrees to proceed.  Recheck CBC today  I have advised her to discuss her history of anaphylaxis and any additional work-up with Dr. Birdie Riddle  I appreciate the opportunity to care for this patient.  CC: Dr. Loralee Pacas, MD    Subjective:   Chief Complaint: iron deficiency anemia  HPI Cassandra Little is here today because of newly discovered iron deficiency anemia, from labs ordered by after Edwinna Areola.  Her ferritin was 6 with hemoglobin 8.8 and MCV 75 on August 15, 2018.  She has a history of hemorrhoids with some bleeding but they are not bleeding now, she had a colonoscopy in 2009 that was negative.  Hemorrhoids will swell and cause some discomfort and she manages those with topical treatments.  She has been a haphazard blood donor over many years and the last 6 months she was donating "more regularly" so I guess a few times.  She does eat iron in her diet I think.  He is also recently started on iron supplementation.  Stools are dark.  History of IBS diarrhea predominant but not bothering her now.  No other active lower gastrointestinal symptoms.  She is not having rectal bleeding or melena.  He does have hoarseness which resolves when she takes a PPI, as long as she takes her Nexium she does not have that problem.  Is concerned about the possibility  of getting cancer and why she has the hoarseness.  She would like an upper endoscopy to evaluate.  She has been somewhat concerned about potential side effects of PPI also we discussed that today.  She drinks 1 cup of coffee and then frequently drinks tea throughout the day but no tobacco. No dysphagia.  She also has a history of angioedema and anaphylaxis it sounds like and is asking me about that today.  She is not had problems in 3 years but it was quite scary and she says she saw an allergist but they did not turn up any cause.  She does carry an EpiPen. Allergies  Allergen Reactions  . Ace Inhibitors     REACTION: cough   Current Meds  Medication Sig  . b complex vitamins tablet Take 1 tablet by mouth daily.  Marland Kitchen buPROPion (WELLBUTRIN XL) 300 MG 24 hr tablet Take 300 mg by mouth daily.   . cetirizine (ZYRTEC) 10 MG tablet Take 10 mg by mouth daily as needed for allergies. Reported on 02/27/2016  . cholecalciferol (VITAMIN D) 1000 units tablet Take 1,000 Units by mouth daily.  Marland Kitchen eletriptan (RELPAX) 20 MG tablet Take 1 tablet (20 mg total) by mouth as needed. may repeat in 2 hours if necessary  . EPINEPHrine (EPIPEN 2-PAK) 0.3 mg/0.3 mL IJ SOAJ injection INJECT 0.3 MLS (0.3 MG TOTAL) INTO THE MUSCLE ONCE.  Marland Kitchen escitalopram (LEXAPRO) 20 MG tablet Take 20 mg by mouth daily.  Marland Kitchen  esomeprazole (NEXIUM) 20 MG capsule Take 20 mg by mouth daily at 12 noon.  . hydrocortisone (ANUSOL-HC) 2.5 % rectal cream APPLY RECTALLY TWICE A DAY  . ibuprofen (ADVIL,MOTRIN) 200 MG tablet Take by mouth.   Past Medical History:  Diagnosis Date  . Angioedema   . Anxiety   . Arthritis   . Cough   . Depression    Dr. Abner Greenspan  . Exposure to hepatitis C   . GERD (gastroesophageal reflux disease)   . Hyperlipidemia   . Hypertension   . Internal and external bleeding hemorrhoids   . Migraine   . PONV (postoperative nausea and vomiting)   . Rosacea   . Sleep apnea    Past Surgical History:  Procedure Laterality  Date  . ABDOMINOPLASTY  2000  . APPENDECTOMY  2004  . AUGMENTATION MAMMAPLASTY Bilateral 2011  . Graf   x 2  . COLONOSCOPY  09/15/2008   internal and external hemorroids  . COSMETIC SURGERY     multiple  . HEMORRHOID BANDING  2014  . REDUCTION MAMMAPLASTY Bilateral 2002  . REPLACEMENT TOTAL KNEE Right   . TONSILLECTOMY  1962  . TOTAL KNEE ARTHROPLASTY Right 12/26/2015   Procedure: TOTAL KNEE ARTHROPLASTY;  Surgeon: Paralee Cancel, MD;  Location: [facility];  Service: Orthopedics;  Laterality: Right;  . TOTAL SHOULDER REPLACEMENT Bilateral 2009   right   Social History   Social History Narrative   She is divorced and retired from [employer]   1 cup of coffee and frequent tea consumption   Daughter lives in [place]   1 drink a week no drugs no tobacco   family history includes Diabetes in her father; Heart attack in her father; Heart disease in her father and mother; Hyperlipidemia in her mother; Hypertension in her father; Kidney disease in her sister; Obesity in her sister.   Review of Systems As per HPI Has osteoarthritis and joint c/o but no sig myalgias  Wt Readings from Last 3 Encounters:  09/23/18 166 lb (75.3 kg)  08/15/18 163 lb 6.4 oz (74.1 kg)  06/06/17 158 lb (71.7 kg)   Does yoga 4+ x a week  Objective:   Physical Exam @BP  130/90   Pulse 84   Ht 5' 0.6" (1.539 m)   Wt 166 lb (75.3 kg)   BMI 31.78 kg/m @  General:  Well-developed, well-nourished and in no acute distress Eyes:  anicteric. ENT:   Mouth and posterior pharynx free of lesions.  Lungs: Clear to auscultation bilaterally. Heart:  S1S2, no rubs, murmurs, gallops. Abdomen:  soft, non-tender, no hepatosplenomegaly, hernia, or mass and BS+.  Rectal: Deferred until colonoscopy Lymph:  no cervical or supraclavicular adenopathy. Extremities:   no edema, cyanosis or clubbing Neuro:  A&O x 3.  Psych:  appropriate mood and  Affect.   Data Reviewed: See HPI.  I reviewed labs previous  endoscopic procedures PCP and gynecology notes

## 2018-09-23 NOTE — Patient Instructions (Addendum)
Proton pump inhibitors or "PPI" medications are very effective medications used in the treatment of gastroesophageal reflux disease and ulcers. There has been much written lately about potential problems with these medications. Reported possible problems to include dementia, kidney disease, osteoporosis or thinning of the bones, and increased risk of Clostridium difficile infection.  When one looks at the research around this what we can say is there are associations and possible cause and effect but not clearly proven and there are some studies that show there is no increased risk or association.  It is very important to sort out if a medication as needed and needed on a continued basis, and understand the risks versus benefits of taking it or not taking it.  As we discussed today, I think the benefits of taking a PPI outweigh the possible risks in your case.  If you are age 70 or older, your body mass index should be between 23-30. Your Body mass index is 31.78 kg/m. If this is out of the aforementioned range listed, please consider follow up with your Primary Care Provider.  If you are age 68 or younger, your body mass index should be between 19-25. Your Body mass index is 31.78 kg/m. If this is out of the aformentioned range listed, please consider follow up with your Primary Care Provider.   It has been recommended to you by your physician that you have a(n) Colon/EGD completed. Per your request, we did not schedule the procedure(s) today. Please contact our office at [phone] should you decide to have the procedure completed.   Your provider has requested that you go to the basement level for lab work before leaving today. Press "B" on the elevator. The lab is located at the first door on the left as you exit the elevator.  It was a pleasure to see you today!  Dr. Silvano Rusk

## 2018-09-24 DIAGNOSIS — Z23 Encounter for immunization: Secondary | ICD-10-CM | POA: Diagnosis not present

## 2018-09-24 LAB — TISSUE TRANSGLUTAMINASE, IGA: (tTG) Ab, IgA: 1 U/mL

## 2018-09-28 NOTE — Progress Notes (Signed)
Hgb rising on Fe SO4 Celiac testing negative My Chart message sent  She needs to be called when Feb schedule out so she can schedule EGD/colonoscopy

## 2018-09-29 NOTE — Progress Notes (Signed)
Spoke with Cassandra Little and she is going to call us back to set up pre-visit and procedure dates.

## 2018-10-13 ENCOUNTER — Telehealth: Payer: Self-pay

## 2018-10-13 NOTE — Telephone Encounter (Signed)
Left her a detailed message that the February dates are out to call us and get her pre-visit and EGD/colonoscopy dates set up. She was seen 09/23/2018 in the office.

## 2018-10-13 NOTE — Telephone Encounter (Signed)
-----   Message from [place] sent at 09/23/2018  9:41 AM EST ----- Regarding: Colon/EGD See office note from 09-23-2018. Pt needs a double, wants an early morning.

## 2018-10-16 ENCOUNTER — Encounter: Payer: Self-pay | Admitting: Internal Medicine

## 2018-10-16 NOTE — Telephone Encounter (Signed)
Pt returned your call and scheduled endo colon on 11/26/18.

## 2018-10-23 ENCOUNTER — Telehealth: Payer: Self-pay | Admitting: Obstetrics & Gynecology

## 2018-10-23 DIAGNOSIS — T782XXS Anaphylactic shock, unspecified, sequela: Secondary | ICD-10-CM

## 2018-10-23 NOTE — Telephone Encounter (Signed)
Call to patient. Patient states that she has a history of anaphylaxis. States her last episode was 2-3 years ago, but states it gets worse each time she experiences it. Patient states "it took a while to stabilize me the last time this happened." Patient states she is retired and wanting to travel, but is uncomfortable in doing so. She has an endoscopy and colonoscopy scheduled with Dr. Carlean Purl in February and mentioned her concerns to him. He told her he thought she should have further testing done. Patient asking for Dr. Ammie Ferrier recommendations on an allergist in the area for her to see. Patient states she lives in [place]. RN advised would review with Dr. Sabra Heck and return call with recommendations. Patient agreeable and appreciative of phone call.   Routing to provider for review.

## 2018-10-23 NOTE — Telephone Encounter (Signed)
Call to patient. Message given to patient as seen below from Dr. Sabra Heck. Patient states she would like to go to [place] location. Patient asking if Dr. Sabra Heck can place referral for her. RN advised would review with Dr. Sabra Heck. Patient agreeable.   Dr. Sabra Heck- okay to place referral to [facility]?

## 2018-10-23 NOTE — Telephone Encounter (Signed)
I would recommend the [facility].  Dr. Shaune Leeks or [place].  They have multiple locations but a [place] and [place] one.  I'm guessing the [place] one would be closer.  I agree that she needs additional testing.  Thanks.

## 2018-10-23 NOTE — Telephone Encounter (Signed)
Referral entered.  Will close encounter.

## 2018-10-23 NOTE — Telephone Encounter (Signed)
Patient left voicemail over lunch requesting Dr. Ammie Ferrier opinion on an allergist for her to go to.

## 2018-11-12 ENCOUNTER — Ambulatory Visit (AMBULATORY_SURGERY_CENTER): Payer: Self-pay

## 2018-11-12 ENCOUNTER — Other Ambulatory Visit: Payer: Self-pay

## 2018-11-12 VITALS — Ht 60.0 in | Wt 167.0 lb

## 2018-11-12 DIAGNOSIS — D509 Iron deficiency anemia, unspecified: Secondary | ICD-10-CM

## 2018-11-12 DIAGNOSIS — K219 Gastro-esophageal reflux disease without esophagitis: Secondary | ICD-10-CM

## 2018-11-12 NOTE — Progress Notes (Signed)
No egg or soy allergy known to patient  No issues with past sedation with any surgeries  or procedures, no intubation problems  No diet pills per patient No home 02 use per patient  No blood thinners per patient  Pt denies issues with constipation  No A fib or A flutter  After major surgery ponv EMMI video sent to pt's e mail , pt declined

## 2018-11-26 ENCOUNTER — Encounter: Payer: Self-pay | Admitting: Internal Medicine

## 2018-11-26 ENCOUNTER — Ambulatory Visit (AMBULATORY_SURGERY_CENTER): Payer: Medicare Other | Admitting: Internal Medicine

## 2018-11-26 ENCOUNTER — Ambulatory Visit: Payer: Medicare Other | Admitting: Allergy

## 2018-11-26 VITALS — BP 143/77 | HR 65 | Temp 99.3°F | Resp 14 | Ht 60.0 in | Wt 167.0 lb

## 2018-11-26 DIAGNOSIS — K209 Esophagitis, unspecified: Secondary | ICD-10-CM | POA: Diagnosis not present

## 2018-11-26 DIAGNOSIS — D509 Iron deficiency anemia, unspecified: Secondary | ICD-10-CM

## 2018-11-26 DIAGNOSIS — K621 Rectal polyp: Secondary | ICD-10-CM

## 2018-11-26 DIAGNOSIS — K5730 Diverticulosis of large intestine without perforation or abscess without bleeding: Secondary | ICD-10-CM | POA: Diagnosis not present

## 2018-11-26 DIAGNOSIS — K219 Gastro-esophageal reflux disease without esophagitis: Secondary | ICD-10-CM | POA: Diagnosis not present

## 2018-11-26 DIAGNOSIS — D128 Benign neoplasm of rectum: Secondary | ICD-10-CM

## 2018-11-26 DIAGNOSIS — K449 Diaphragmatic hernia without obstruction or gangrene: Secondary | ICD-10-CM

## 2018-11-26 DIAGNOSIS — D129 Benign neoplasm of anus and anal canal: Secondary | ICD-10-CM

## 2018-11-26 HISTORY — PX: UPPER GASTROINTESTINAL ENDOSCOPY: SHX188

## 2018-11-26 MED ORDER — FERROUS SULFATE 325 (65 FE) MG PO TABS: 325.0000 mg | ORAL_TABLET | Freq: Every day | ORAL | 3 refills | Status: DC

## 2018-11-26 MED ORDER — SODIUM CHLORIDE 0.9 % IV SOLN: 500.0000 mL | Freq: Once | INTRAVENOUS | Status: DC

## 2018-11-26 NOTE — Op Note (Signed)
[facility] Patient Name: Cassandra Little Procedure Date: 11/26/2018 7:51 AM MRN: [id number] Endoscopist: Gatha Mayer , MD Age: 73 Referring MD:  Date of Birth: 08/10/1946 Gender: Female Account #: [id number] Procedure:                Upper GI endoscopy Indications:              Iron deficiency anemia, Esophageal reflux,                            Follow-up of esophageal reflux Medicines:                Propofol per Anesthesia, Monitored Anesthesia Care Procedure:                Pre-Anesthesia Assessment:                           - Prior to the procedure, a History and Physical                            was performed, and patient medications and                            allergies were reviewed. The patient's tolerance of                            previous anesthesia was also reviewed. The risks                            and benefits of the procedure and the sedation                            options and risks were discussed with the patient.                            All questions were answered, and informed consent                            was obtained. Prior Anticoagulants: The patient has                            taken no previous anticoagulant or antiplatelet                            agents. ASA Grade Assessment: II - A patient with                            mild systemic disease. After reviewing the risks                            and benefits, the patient was deemed in                            satisfactory condition to undergo the procedure.  After obtaining informed consent, the endoscope was                            passed under direct vision. Throughout the                            procedure, the patient's blood pressure, pulse, and                            oxygen saturations were monitored continuously. The                            Endoscope was introduced through the mouth, and                            advanced  to the second part of duodenum. The upper                            GI endoscopy was accomplished without difficulty.                            The patient tolerated the procedure well. Scope In: Scope Out: Findings:                 A small hiatal hernia was present.                           The exam was otherwise without abnormality.                           The cardia and gastric fundus were normal on                            retroflexion. Complications:            No immediate complications. Estimated Blood Loss:     Estimated blood loss: none. Impression:               - Small hiatal hernia.                           - The examination was otherwise normal.                           - No specimens collected. Recommendation:           - Patient has a contact number available for                            emergencies. The signs and symptoms of potential                            delayed complications were discussed with the                            patient. Return to normal activities tomorrow.  Written discharge instructions were provided to the                            patient.                           - Resume previous diet.                           - Continue present medications.                           - See the other procedure note for documentation of                            additional recommendations. Gatha Mayer, MD 11/26/2018 8:31:33 AM This report has been signed electronically.

## 2018-11-26 NOTE — Patient Instructions (Addendum)
One little rectal polyp removed, diverticulosis seen.   Small hiatal hernia on upper exam - common issue and not a problem.  Bottom line is no cause of blood loss.  Probably multiple factors. Do not donate blood anymore.  Please take ferrous sulfate 325 mg each day and follow-up with Dr. Birdie Riddle regarding the anemia and low iron.  I appreciate the opportunity to care for you. Gatha Mayer, MD, [facility]  Please read handouts provided. Await pathology results. Continue present medications.    YOU HAD AN ENDOSCOPIC PROCEDURE TODAY AT [facility]:   Refer to the procedure report that was given to you for any specific questions about what was found during the examination.  If the procedure report does not answer your questions, please call your gastroenterologist to clarify.  If you requested that your care partner not be given the details of your procedure findings, then the procedure report has been included in a sealed envelope for you to review at your convenience later.  YOU SHOULD EXPECT: Some feelings of bloating in the abdomen. Passage of more gas than usual.  Walking can help get rid of the air that was put into your GI tract during the procedure and reduce the bloating. If you had a lower endoscopy (such as a colonoscopy or flexible sigmoidoscopy) you may notice spotting of blood in your stool or on the toilet paper. If you underwent a bowel prep for your procedure, you may not have a normal bowel movement for a few days.  Please Note:  You might notice some irritation and congestion in your nose or some drainage.  This is from the oxygen used during your procedure.  There is no need for concern and it should clear up in a day or so.  SYMPTOMS TO REPORT IMMEDIATELY:   Following lower endoscopy (colonoscopy or flexible sigmoidoscopy):  Excessive amounts of blood in the stool  Significant tenderness or worsening of abdominal pains  Swelling of the abdomen  that is new, acute  Fever of 100F or higher   Following upper endoscopy (EGD)  Vomiting of blood or coffee ground material  New chest pain or pain under the shoulder blades  Painful or persistently difficult swallowing  New shortness of breath  Fever of 100F or higher  Black, tarry-looking stools  For urgent or emergent issues, a gastroenterologist can be reached at any hour by calling [phone].   DIET:  We do recommend a small meal at first, but then you may proceed to your regular diet.  Drink plenty of fluids but you should avoid alcoholic beverages for 24 hours.  ACTIVITY:  You should plan to take it easy for the rest of today and you should NOT DRIVE or use heavy machinery until tomorrow (because of the sedation medicines used during the test).    FOLLOW UP: Our staff will call the number listed on your records the next business day following your procedure to check on you and address any questions or concerns that you may have regarding the information given to you following your procedure. If we do not reach you, we will leave a message.  However, if you are feeling well and you are not experiencing any problems, there is no need to return our call.  We will assume that you have returned to your regular daily activities without incident.  If any biopsies were taken you will be contacted by phone or by letter within the next 1-3 weeks.  Please call us at [phone] if you have not heard about the biopsies in 3 weeks.    SIGNATURES/CONFIDENTIALITY: You and/or your care partner have signed paperwork which will be entered into your electronic medical record.  These signatures attest to the fact that that the information above on your After Visit Summary has been reviewed and is understood.  Full responsibility of the confidentiality of this discharge information lies with you and/or your care-partner.

## 2018-11-26 NOTE — Op Note (Signed)
[facility] Patient Name: Cassandra Little Procedure Date: 11/26/2018 7:51 AM MRN: [id number] Endoscopist: Gatha Mayer , MD Age: 73 Referring MD:  Date of Birth: Aug 09, 1946 Gender: Female Account #: [id number] Procedure:                Colonoscopy Indications:              Iron deficiency anemia Medicines:                Propofol per Anesthesia, Monitored Anesthesia Care Procedure:                Pre-Anesthesia Assessment:                           - Prior to the procedure, a History and Physical                            was performed, and patient medications and                            allergies were reviewed. The patient's tolerance of                            previous anesthesia was also reviewed. The risks                            and benefits of the procedure and the sedation                            options and risks were discussed with the patient.                            All questions were answered, and informed consent                            was obtained. Prior Anticoagulants: The patient has                            taken no previous anticoagulant or antiplatelet                            agents. ASA Grade Assessment: II - A patient with                            mild systemic disease. After reviewing the risks                            and benefits, the patient was deemed in                            satisfactory condition to undergo the procedure.                           After obtaining informed consent, the colonoscope  was passed under direct vision. Throughout the                            procedure, the patient's blood pressure, pulse, and                            oxygen saturations were monitored continuously. The                            Colonoscope was introduced through the anus and                            advanced to the the terminal ileum, with                            identification of the  appendiceal orifice and IC                            valve. The colonoscopy was performed without                            difficulty. The patient tolerated the procedure                            well. The quality of the bowel preparation was                            excellent. The ileocecal valve, appendiceal                            orifice, and rectum were photographed. Scope In: 8:08:23 AM Scope Out: 8:22:09 AM Scope Withdrawal Time: 0 hours 11 minutes 31 seconds  Total Procedure Duration: 0 hours 13 minutes 46 seconds  Findings:                 The digital rectal exam was normal.                           A 2 mm polyp was found in the rectum. The polyp was                            sessile. The polyp was removed with a cold snare.                            Resection and retrieval were complete. Verification                            of patient identification for the specimen was                            done. Estimated blood loss was minimal.                           Multiple diverticula were found in the sigmoid  colon.                           The terminal ileum appeared normal.                           The exam was otherwise without abnormality on                            direct and retroflexion views. Complications:            No immediate complications. Estimated Blood Loss:     Estimated blood loss was minimal. Impression:               - One 2 mm polyp in the rectum, removed with a cold                            snare. Resected and retrieved.                           - Diverticulosis in the sigmoid colon.                           - The examined portion of the ileum was normal.                           - The examination was otherwise normal on direct                            and retroflexion views. Recommendation:           - Patient has a contact number available for                            emergencies. The signs and  symptoms of potential                            delayed complications were discussed with the                            patient. Return to normal activities tomorrow.                            Written discharge instructions were provided to the                            patient.                           - Resume previous diet.                           - Continue present medications.                           - Repeat colonoscopy is recommended. The  colonoscopy date will be determined after pathology                            results from today's exam become available for                            review.                           She needs to take ferrous sulfate 325 mg daily and                            f/.u Dr. Birdie Riddle re: anemia and iron deficiency                           If she fails to resolve and hold normal Hgb after                            that then consider hemoccults and capsule endoscopy                            perhaps. Gatha Mayer, MD 11/26/2018 8:36:45 AM This report has been signed electronically.

## 2018-11-26 NOTE — Progress Notes (Signed)
PT taken to PACU. Monitors in place. VSS. Report given to RN. 

## 2018-11-26 NOTE — Progress Notes (Signed)
Pt's states no medical or surgical changes since previsit or office visit. 

## 2018-11-27 ENCOUNTER — Telehealth: Payer: Self-pay

## 2018-11-27 ENCOUNTER — Ambulatory Visit (INDEPENDENT_AMBULATORY_CARE_PROVIDER_SITE_OTHER): Payer: Medicare Other | Admitting: Allergy

## 2018-11-27 ENCOUNTER — Encounter: Payer: Self-pay | Admitting: Allergy

## 2018-11-27 ENCOUNTER — Telehealth: Payer: Self-pay | Admitting: *Deleted

## 2018-11-27 VITALS — BP 136/88 | HR 88 | Temp 98.8°F | Resp 16 | Ht 60.0 in | Wt 171.0 lb

## 2018-11-27 DIAGNOSIS — T7840XA Allergy, unspecified, initial encounter: Secondary | ICD-10-CM | POA: Insufficient documentation

## 2018-11-27 DIAGNOSIS — J3089 Other allergic rhinitis: Secondary | ICD-10-CM

## 2018-11-27 DIAGNOSIS — T782XXD Anaphylactic shock, unspecified, subsequent encounter: Secondary | ICD-10-CM | POA: Diagnosis not present

## 2018-11-27 DIAGNOSIS — T7840XD Allergy, unspecified, subsequent encounter: Secondary | ICD-10-CM | POA: Diagnosis not present

## 2018-11-27 DIAGNOSIS — T783XXD Angioneurotic edema, subsequent encounter: Secondary | ICD-10-CM | POA: Diagnosis not present

## 2018-11-27 DIAGNOSIS — T783XXA Angioneurotic edema, initial encounter: Secondary | ICD-10-CM | POA: Insufficient documentation

## 2018-11-27 NOTE — Telephone Encounter (Signed)
Left message, will try again a little later this afternoon.

## 2018-11-27 NOTE — Progress Notes (Signed)
New Patient Note  RE: Cassandra Little MRN: [id number] DOB: [birth year] Date of Office Visit: 11/27/2018  Referring provider: Megan Salon, MD Primary care provider: Midge Minium, MD  Chief Complaint: Angioedema (ongoing for 24 years. seems to be a randomly occurring issue. ) and Allergic Reaction (had a severe anaphylaxis 4 years. Her GI doctor recommended she be retested to see what was causing these problems. No other issues since that severe reaction 4 years ago. )  History of Present Illness: I had the pleasure of seeing Cassandra Little for initial evaluation at the [facility] on 11/27/2018. She is a 73 y.o. female, who is referred here by Dr. Sabra Heck (Gyn) for the evaluation of anaphylactic reactions/angioedema.   Patient was working at [employer] and was working 80-90 hours a week when she had her first episode of angioedema. This was about 24 years ago. She developed swelling of the lips. No associated rash/hives or respiratory distress. Since then she had multiple episodes like above. The symptoms would persists until she was seen in ER and received some type of medications. The symptoms would resolve the same day.   No specific triggers noted but did notice that they seem to occur in times of high stress.   She was evaluated by allergist over 10 years ago and had some skin testing which was apparently positive to everything. Not sure if she had any bloodwork with this.   These episodes would occur 1-2 times a year and sometimes no episodes for a few years.  The last episode was about 8 years ago which required ER visit with 2 rounds of epi, solumedrol, Pepcid and benadryl. Reviewed ER note from 02/07/2011.  Denies any changes in diet, medications, personal care products, or recent infections during these episodes. No insect/bug bites.   Patient is up to date with the following cancer screening tests: mammogram, pap smears, colonoscopy.  Patient had EGD and  colonoscopy yesterday.  Patient is very concerned about traveling abroad and having a reaction.   Assessment and Plan: Cassandra Little is a 73 y.o. female with: Anaphylaxis Anaphylactic reactions started 24 years ago with unknown cause. Symptoms include lip/tongue swelling, throat tightness. Denies any hives/rash. Last episode was 8 years ago requiring 2 IM epi. She had some type of work up 10 years ago by allergy but no cause was found.   Discussed with patient that not sure what may have caused these episodes.  We will get bloodwork as below to rule out any other underlying causes.   For mild symptoms you can take over the counter antihistamines such as Benadryl and monitor symptoms closely. If symptoms worsen or if you have severe symptoms including breathing issues, throat closure, significant swelling, whole body hives, severe diarrhea and vomiting, lightheadedness then inject epinephrine and seek immediate medical care afterwards.  Action plan given.  Stressed importance of carrying both Epipen together given her history of needing 2 round of epinephrine.   Get tryptase and C4 level drawn within 2-3 hours of reaction.  Angio-edema Get bloodwork as below to rule out hereditary angioedema.  Other allergic rhinitis Mild rhinitis symptoms in the spring and fall. Usually resolves with zyrtec prn.  Will check for environmental allergies.   May use over the counter antihistamines such as Zyrtec (cetirizine), Claritin (loratadine), Allegra (fexofenadine), or Xyzal (levocetirizine) daily as needed.  Return in about 1 year (around 11/28/2019).  Lab Orders     CBC with Differential/Platelet     Sedimentation  rate     Comprehensive metabolic panel     Tryptase     ANA w/Reflex     Allergens Zone 2     Allergen Hymenoptera Panel     Allergen Fire Ant     C1 Esterase Inhibitor     C1 esterase inhibitor, functional     Complement component c1q     Tryptase     C4 complement     C3 and  C4     Thyroid Cascade Profile  Other allergy screening: Asthma: no Rhino conjunctivitis: yes  Some mild rhinitis symptoms during the spring and fall for which she takes zyrtec with good benefit. Food allergy: no  Dietary History: patient has been eating other foods including milk, eggs, peanut, treenuts, sesame, shellfish, seafood, soy, wheat, meats, fruits and vegetables. Medication allergy: yes  Ace inhibitors - coughing Hymenoptera allergy: no Urticaria: no Eczema:no History of recurrent infections suggestive of immunodeficency: no  Diagnostics: None.  Past Medical History: Patient Active Problem List   Diagnosis Date Noted  . Angio-edema 11/27/2018  . Allergic reaction 11/27/2018  . Other allergic rhinitis 11/27/2018  . Iron deficiency anemia 09/23/2018  . Laryngopharyngeal reflux (LPR) 09/23/2018  . Vitamin D deficiency 02/20/2017  . S/P right TKA 12/26/2015  . Right foot pain 02/02/2015  . Stress incontinence 01/05/2015  . Arthritis 01/05/2015  . Diarrhea, ? IBS, could be microscopic colitis 09/25/2013  . Migraine 04/23/2012  . Disruption of suture line 11/09/2011  . Hemorrhoids 11/09/2011  . General medical examination 09/17/2011  . Neoplasm of uncertain behavior of skin 08/27/2011  . Anaphylaxis 02/14/2011  . GERD 09/18/2010  . Hyperlipidemia 08/20/2008  . Depression 08/20/2008   Past Medical History:  Diagnosis Date  . Anaphylaxis 02/14/2011   Unknown cause.  Always carries Epipen.  . Angioedema   . Anxiety   . Arthritis   . Cough   . Depression    Dr. Abner Greenspan  . Exposure to hepatitis C   . GERD (gastroesophageal reflux disease)   . Hyperlipidemia   . Hypertension   . IBS (irritable bowel syndrome)   . Internal and external bleeding hemorrhoids   . Migraine   . PONV (postoperative nausea and vomiting)   . Rosacea   . Sleep apnea    Past Surgical History: Past Surgical History:  Procedure Laterality Date  . ABDOMINOPLASTY  2000  .  APPENDECTOMY  2004  . AUGMENTATION MAMMAPLASTY Bilateral 2011  . [place]   x 2  . COLONOSCOPY  09/15/2008   internal and external hemorroids  . COSMETIC SURGERY     multiple  . HEMORRHOID BANDING  2014  . REDUCTION MAMMAPLASTY Bilateral 2002  . TONSILLECTOMY  1962  . TOTAL KNEE ARTHROPLASTY Right 12/26/2015   Procedure: TOTAL KNEE ARTHROPLASTY;  Surgeon: Paralee Cancel, MD;  Location: [facility];  Service: Orthopedics;  Laterality: Right;  . TOTAL SHOULDER REPLACEMENT Bilateral 2009   right  . TOTAL SHOULDER REPLACEMENT    . UPPER GASTROINTESTINAL ENDOSCOPY  11/26/2018   Medication List:  Current Outpatient Medications  Medication Sig Dispense Refill  . b complex vitamins tablet Take 1 tablet by mouth daily.    Marland Kitchen buPROPion (WELLBUTRIN XL) 300 MG 24 hr tablet Take 300 mg by mouth daily.     . cetirizine (ZYRTEC) 10 MG tablet Take 10 mg by mouth daily as needed for allergies. Reported on 02/27/2016    . cholecalciferol (VITAMIN D) 1000 units tablet Take 1,000 Units by  mouth daily.    Marland Kitchen eletriptan (RELPAX) 20 MG tablet Take 1 tablet (20 mg total) by mouth as needed. may repeat in 2 hours if necessary 10 tablet 2  . EPINEPHrine (EPIPEN 2-PAK) 0.3 mg/0.3 mL IJ SOAJ injection INJECT 0.3 MLS (0.3 MG TOTAL) INTO THE MUSCLE ONCE. 2 Device 1  . escitalopram (LEXAPRO) 20 MG tablet Take 20 mg by mouth daily.    Marland Kitchen esomeprazole (NEXIUM) 20 MG capsule Take 20 mg by mouth daily at 12 noon.    . ferrous sulfate 325 (65 FE) MG tablet Take 1 tablet (325 mg total) by mouth daily with breakfast.  3  . hydrocortisone (ANUSOL-HC) 2.5 % rectal cream APPLY RECTALLY TWICE A DAY 30 g 0  . ibuprofen (ADVIL,MOTRIN) 200 MG tablet Take by mouth.     No current facility-administered medications for this visit.    Allergies: Allergies  Allergen Reactions  . Ace Inhibitors     REACTION: cough   Social History: Social History   Socioeconomic History  . Marital status: Divorced    Spouse name:  Not on file  . Number of children: Not on file  . Years of education: Not on file  . Highest education level: Not on file  Occupational History  . 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 Financial resource strain: Not on file  . Food insecurity:    Worry: Not on file    Inability: Not on file  . Transportation needs:    Medical: Not on file    Non-medical: Not on file  Tobacco Use  . Smoking status: Never Smoker  . Smokeless tobacco: Never Used  Substance and Sexual Activity  . Alcohol use: Yes    Alcohol/week: 1.0 standard drinks    Types: 1 Glasses of wine per week  . Drug use: No  . Sexual activity: Not Currently  Lifestyle  . Physical activity:    Days per week: Not on file    Minutes per session: Not on file  . Stress: Not on file  Relationships  . Social connections:    Talks on phone: Not on file    Gets together: Not on file    Attends religious service: Not on file    Active member of club or organization: Not on file    Attends meetings of clubs or organizations: Not on file    Relationship status: Not on file  Other Topics Concern  . Not on file  Social History Narrative   She is divorced and retired from [employer]   1 cup of coffee and frequent tea consumption   Daughter lives in area   1 drink a week no drugs no tobacco   Lives in a 71 year old home. Smoking: denies Occupation: retired  Programme researcher, broadcasting/film/video HistoryFreight forwarder in the house: no Charity fundraiser in the family room: no Carpet in the bedroom: yes Heating: gas Cooling: central Pet: yes 1 dog x 10 yrs  Family History: Family History  Problem Relation Age of Onset  . Hyperlipidemia Mother   . Heart disease Mother   . Allergic rhinitis Mother   . Eczema Mother   . Diabetes Father   . Hypertension Father   . Heart attack Father        65s  . Heart disease Father   . Obesity Sister   . Hypertension Sister   . Hyperlipidemia Sister   . Colon cancer  Neg Hx   .  Stomach cancer Neg Hx   . Rectal cancer Neg Hx   . Pancreatic cancer Neg Hx   . Esophageal cancer Neg Hx   . Angioedema Neg Hx   . Asthma Neg Hx   . Atopy Neg Hx   . Immunodeficiency Neg Hx   . Urticaria Neg Hx    Problem                               Relation Asthma                                   No  Eczema                                No  Food allergy                          No  Allergic rhino conjunctivitis     No  Angioedema/anaphylaxis        No  Review of Systems  Constitutional: Negative for appetite change, chills, fever and unexpected weight change.  HENT: Negative for congestion and rhinorrhea.   Eyes: Negative for itching.  Respiratory: Negative for cough, chest tightness, shortness of breath and wheezing.   Cardiovascular: Negative for chest pain.  Gastrointestinal: Negative for abdominal pain.  Genitourinary: Negative for difficulty urinating.  Skin: Negative for rash.  Allergic/Immunologic: Negative for environmental allergies and food allergies.  Neurological: Negative for headaches.   Objective: BP 136/88 (BP Location: Left Arm, Patient Position: Sitting, Cuff Size: Normal)   Pulse 88   Temp 98.8 F (37.1 C) (Oral)   Resp 16   Ht 5' (1.524 m)   Wt 171 lb (77.6 kg)   SpO2 98%   BMI 33.40 kg/m  Body mass index is 33.4 kg/m. Physical Exam  Constitutional: She is oriented to person, place, and time. She appears well-developed and well-nourished.  HENT:  Head: Normocephalic and atraumatic.  Right Ear: External ear normal.  Left Ear: External ear normal.  Nose: Nose normal.  Mouth/Throat: Oropharynx is clear and moist.  Eyes: Conjunctivae and EOM are normal.  Neck: Neck supple.  Cardiovascular: Normal rate, regular rhythm and normal heart sounds. Exam reveals no gallop and no friction rub.  No murmur heard. Pulmonary/Chest: Effort normal and breath sounds normal. She has no wheezes. She has no rales.  Abdominal: Soft.   Lymphadenopathy:    She has no cervical adenopathy.  Neurological: She is alert and oriented to person, place, and time.  Skin: Skin is warm. No rash noted.  Psychiatric: She has a normal mood and affect. Her behavior is normal.  Nursing note and vitals reviewed.  The plan was reviewed with the patient/family, and all questions/concerned were addressed.  It was my pleasure to see Cassandra Little today and participate in her care. Please feel free to contact me with any questions or concerns.  Sincerely,  Rexene Alberts, DO Allergy & Immunology  [facility] office: [phone] [facility] office: [phone]

## 2018-11-27 NOTE — Patient Instructions (Signed)
Get bloodwork - CBC diff, ESR, CMP, C3, C4, tryptase, ANA with reflex, thyroid cascade, Ige with Zone 2, venom panel, fire ant, c1 esterase function, c1 esterase level, c1q  Will call you with the results.  For mild symptoms you can take over the counter antihistamines such as Benadryl and monitor symptoms closely. If symptoms worsen or if you have severe symptoms including breathing issues, throat closure, significant swelling, whole body hives, severe diarrhea and vomiting, lightheadedness then inject epinephrine and seek immediate medical care afterwards.  Action plan given.  Get tryptase and C4 level drawn within 2-3 hours of reaction.  Follow up in 1 year.

## 2018-11-27 NOTE — Assessment & Plan Note (Signed)
Mild rhinitis symptoms in the spring and fall. Usually resolves with zyrtec prn.  Will check for environmental allergies.   May use over the counter antihistamines such as Zyrtec (cetirizine), Claritin (loratadine), Allegra (fexofenadine), or Xyzal (levocetirizine) daily as needed.

## 2018-11-27 NOTE — Assessment & Plan Note (Signed)
Get bloodwork as below to rule out hereditary angioedema.

## 2018-11-27 NOTE — Assessment & Plan Note (Signed)
Anaphylactic reactions started 24 years ago with unknown cause. Symptoms include lip/tongue swelling, throat tightness. Denies any hives/rash. Last episode was 8 years ago requiring 2 IM epi. She had some type of work up 10 years ago by allergy but no cause was found.   Discussed with patient that not sure what may have caused these episodes.  We will get bloodwork as below to rule out any other underlying causes.   For mild symptoms you can take over the counter antihistamines such as Benadryl and monitor symptoms closely. If symptoms worsen or if you have severe symptoms including breathing issues, throat closure, significant swelling, whole body hives, severe diarrhea and vomiting, lightheadedness then inject epinephrine and seek immediate medical care afterwards.  Action plan given.  Stressed importance of carrying both Epipen together given her history of needing 2 round of epinephrine.   Get tryptase and C4 level drawn within 2-3 hours of reaction.

## 2018-11-27 NOTE — Telephone Encounter (Signed)
  Follow up Call-  Call back number 11/26/2018  Post procedure Call Back phone  # [phone]  Permission to leave phone message Yes  Some recent data might be hidden     Patient questions:  Do you have a fever, pain , or abdominal swelling? No. Pain Score  0 *  Have you tolerated food without any problems? Yes.    Have you been able to return to your normal activities? Yes.    Do you have any questions about your discharge instructions: Diet   No. Medications  No. Follow up visit  No.  Do you have questions or concerns about your Care? No.  Actions: * If pain score is 4 or above: No action needed, pain <4.

## 2018-11-28 LAB — THYROID CASCADE PROFILE: TSH: 3.46 u[IU]/mL (ref 0.450–4.500)

## 2018-12-01 ENCOUNTER — Encounter: Payer: Self-pay | Admitting: Internal Medicine

## 2018-12-01 NOTE — Progress Notes (Signed)
Hyperplastic rectal polyp No recall (72) My Chart letter

## 2018-12-03 ENCOUNTER — Encounter: Payer: Self-pay | Admitting: *Deleted

## 2018-12-03 LAB — IGE+ALLERGENS ZONE 2(30)
Alternaria Alternata IgE: <0.1 kU/L
Amer Sycamore IgE Qn: 0.17 kU/L — AB
Cat Dander IgE: 0.53 kU/L — AB
Cedar, Mountain IgE: 3.21 kU/L — AB
Cladosporium Herbarum IgE: <0.1 kU/L
Cockroach, American IgE: <0.1 kU/L
Common Silver Birch IgE: 0.71 kU/L — AB
D Farinae IgE: 0.12 kU/L — AB
D Pteronyssinus IgE: 0.19 kU/L — AB
Dog Dander IgE: 0.2 kU/L — AB
Elm, American IgE: 0.26 kU/L — AB
G002-IGE BERMUDA GRASS: 0.28 kU/L — AB
G017-IGE BAHIA GRASS: 0.13 kU/L — AB
Hickory, White IgE: 0.4 kU/L — AB
IgE (Immunoglobulin E), Serum: 135 IU/mL (ref 6–495)
Johnson Grass IgE: 0.1 kU/L — AB
Maple/Box Elder IgE: 0.62 kU/L — AB
Mucor Racemosus IgE: <0.1 kU/L
Mugwort IgE Qn: 0.19 kU/L — AB
Nettle IgE: <0.1 kU/L
Oak, White IgE: 0.72 kU/L — AB
Penicillium Chrysogen IgE: <0.1 kU/L
Plantain, English IgE: 0.17 kU/L — AB
Ragweed, Short IgE: 2.27 kU/L — AB
Sheep Sorrel IgE Qn: 0.27 kU/L — AB
Stemphylium Herbarum IgE: <0.1 kU/L
Sweet gum IgE RAST Ql: 0.24 kU/L — AB
Timothy Grass IgE: 0.68 kU/L — AB
W014-IGE PIGWEED, ROUGH: 0.18 kU/L — AB
White Mulberry IgE: <0.1 kU/L

## 2018-12-03 LAB — CBC WITH DIFFERENTIAL/PLATELET
Basophils Absolute: 0 10*3/uL (ref 0.0–0.2)
Basos: 1 %
EOS (ABSOLUTE): 0.1 10*3/uL (ref 0.0–0.4)
Eos: 2 %
Hematocrit: 37.2 % (ref 34.0–46.6)
Hemoglobin: 11.7 g/dL (ref 11.1–15.9)
Immature Grans (Abs): 0 10*3/uL (ref 0.0–0.1)
Immature Granulocytes: 0 %
LYMPHS ABS: 2.3 10*3/uL (ref 0.7–3.1)
Lymphs: 34 %
MCH: 24 pg — ABNORMAL LOW (ref 26.6–33.0)
MCHC: 31.5 g/dL (ref 31.5–35.7)
MCV: 76 fL — ABNORMAL LOW (ref 79–97)
MONOS ABS: 0.6 10*3/uL (ref 0.1–0.9)
Monocytes: 9 %
NEUTROS ABS: 3.5 10*3/uL (ref 1.4–7.0)
Neutrophils: 54 %
Platelets: 272 10*3/uL (ref 150–450)
RBC: 4.88 x10E6/uL (ref 3.77–5.28)
RDW: 20.5 % — AB (ref 11.7–15.4)
WBC: 6.6 10*3/uL (ref 3.4–10.8)

## 2018-12-03 LAB — ALLERGEN HYMENOPTERA PANEL
Bumblebee: <0.1 kU/L
Honeybee IgE: <0.1 kU/L
Hornet, Yellow, IgE: <0.1 kU/L
Paper Wasp IgE: <0.1 kU/L
Yellow Jacket, IgE: <0.1 kU/L

## 2018-12-03 LAB — ANA W/REFLEX: Anti Nuclear Antibody(ANA): NEGATIVE

## 2018-12-03 LAB — COMPREHENSIVE METABOLIC PANEL
ALT: 21 IU/L (ref 0–32)
AST: 20 IU/L (ref 0–40)
Albumin/Globulin Ratio: 1.6 (ref 1.2–2.2)
Albumin: 4.2 g/dL (ref 3.8–4.8)
Alkaline Phosphatase: 89 IU/L (ref 39–117)
BUN / CREAT RATIO: 12 (ref 12–28)
BUN: 14 mg/dL (ref 8–27)
Bilirubin Total: <0.2 mg/dL (ref 0.0–1.2)
CO2: 21 mmol/L (ref 20–29)
Calcium: 9.4 mg/dL (ref 8.7–10.3)
Chloride: 105 mmol/L (ref 96–106)
Creatinine, Ser: 1.16 mg/dL — ABNORMAL HIGH (ref 0.57–1.00)
GFR calc Af Amer: 55 mL/min/{1.73_m2} — ABNORMAL LOW (ref >59)
GFR calc non Af Amer: 48 mL/min/{1.73_m2} — ABNORMAL LOW (ref >59)
Globulin, Total: 2.7 g/dL (ref 1.5–4.5)
Glucose: 104 mg/dL — ABNORMAL HIGH (ref 65–99)
Potassium: 4.4 mmol/L (ref 3.5–5.2)
Sodium: 142 mmol/L (ref 134–144)
Total Protein: 6.9 g/dL (ref 6.0–8.5)

## 2018-12-03 LAB — C3 AND C4
Complement C3, Serum: 104 mg/dL (ref 82–167)
Complement C4, Serum: 20 mg/dL (ref 14–44)

## 2018-12-03 LAB — TRYPTASE: Tryptase: 4.5 ug/L (ref 2.2–13.2)

## 2018-12-03 LAB — SEDIMENTATION RATE: Sed Rate: 23 mm/hr (ref 0–40)

## 2018-12-03 LAB — C1 ESTERASE INHIBITOR, FUNCTIONAL: C1INH Functional/C1INH Total MFr SerPl: 28 %mean normal — ABNORMAL LOW

## 2018-12-03 LAB — C1 ESTERASE INHIBITOR: C1INH SerPl-mCnc: 31 mg/dL (ref 21–39)

## 2018-12-03 LAB — COMPLEMENT COMPONENT C1Q: Complement C1Q: 12.1 mg/dL (ref 10.3–20.5)

## 2018-12-03 LAB — ALLERGEN FIRE ANT: I070-IgE Fire Ant (Invicta): <0.1 kU/L

## 2018-12-05 ENCOUNTER — Encounter: Payer: Self-pay | Admitting: Allergy

## 2018-12-05 DIAGNOSIS — T783XXD Angioneurotic edema, subsequent encounter: Secondary | ICD-10-CM

## 2018-12-08 ENCOUNTER — Telehealth: Payer: Self-pay | Admitting: Allergy

## 2018-12-08 NOTE — Telephone Encounter (Signed)
Printed labs and mailed to patient . Called patient is aware labs were mailed

## 2018-12-08 NOTE — Telephone Encounter (Signed)
Patient is calling stating she would like the lab orders mailed to her instead of picking them up at the office Any questions - please call the patient

## 2018-12-16 DIAGNOSIS — T783XXD Angioneurotic edema, subsequent encounter: Secondary | ICD-10-CM | POA: Diagnosis not present

## 2018-12-19 LAB — C4 COMPLEMENT: Complement C4, Serum: 23 mg/dL (ref 14–44)

## 2018-12-19 LAB — C1 ESTERASE INHIBITOR, FUNCTIONAL: C1INH Functional/C1INH Total MFr SerPl: 91 %mean normal

## 2018-12-19 LAB — C1 ESTERASE INHIBITOR: C1INH SerPl-mCnc: 29 mg/dL (ref 21–39)

## 2018-12-22 ENCOUNTER — Encounter: Payer: Self-pay | Admitting: Allergy

## 2018-12-23 ENCOUNTER — Ambulatory Visit: Payer: Self-pay

## 2018-12-23 ENCOUNTER — Encounter: Payer: Self-pay | Admitting: Internal Medicine

## 2018-12-23 ENCOUNTER — Ambulatory Visit (INDEPENDENT_AMBULATORY_CARE_PROVIDER_SITE_OTHER): Payer: Medicare Other | Admitting: Internal Medicine

## 2018-12-23 VITALS — BP 126/68 | HR 86 | Temp 98.6°F | Resp 16 | Ht 60.0 in | Wt 166.2 lb

## 2018-12-23 DIAGNOSIS — J069 Acute upper respiratory infection, unspecified: Secondary | ICD-10-CM

## 2018-12-23 MED ORDER — AZELASTINE HCL 0.1 % NA SOLN: 2.0000 | Freq: Two times a day (BID) | NASAL | 6 refills | Status: DC

## 2018-12-23 NOTE — Telephone Encounter (Signed)
  Pt c/o mild sinus pain and pressure, NP cough, subjective "mild" fever. Pt c/o episode of dizziness yesterday. None today.  Pt hoarse.  Care advice given and pt verbalized understanding.  Scheduled appt with PCP tomorrow. Pt requests to be seen today and to go to [facility]. Pt given appt with Dr Larose Kells today. Pt verbalized understanding. No appts today available with Dr Birdie Riddle.  Reason for Disposition . [1] Sinus pain (not just congestion) AND [2] fever  Answer Assessment - Initial Assessment Questions 1. LOCATION: "Where does it hurt?"      Sinus ache 2. ONSET: "When did the sinus pain start?"  (e.g., hours, days)      12/21/18 3. SEVERITY: "How bad is the pain?"   (Scale 1-10; mild, moderate or severe)   - MILD (1-3): doesn't interfere with normal activities    - MODERATE (4-7): interferes with normal activities (e.g., work or school) or awakens from sleep   - SEVERE (8-10): excruciating pain and patient unable to do any normal activities        mild 4. RECURRENT SYMPTOM: "Have you ever had sinus problems before?" If so, ask: "When was the last time?" and "What happened that time?"      Yes- 6 months- OTC medication 5. NASAL CONGESTION: "Is the nose blocked?" If so, ask, "Can you open it or must you breathe through the mouth?"     no 6. NASAL DISCHARGE: "Do you have discharge from your nose?" If so ask, "What color?"     no 7. FEVER: "Do you have a fever?" If so, ask: "What is it, how was it measured, and when did it start?"      Yesterday dizziness - subjective fever "mild" 8. OTHER SYMPTOMS: "Do you have any other symptoms?" (e.g., sore throat, cough, earache, difficulty breathing)     Cough, hoarse,  9. PREGNANCY: "Is there any chance you are pregnant?" "When was your last menstrual period?"     n/a  Protocols used: SINUS PAIN OR CONGESTION-A-AH

## 2018-12-23 NOTE — Progress Notes (Signed)
Subjective:    Patient ID: Cassandra Little, female    DOB: Feb 03, 1946, 73 y.o.   MRN: [id number]  DOS:  12/23/2018 Type of visit - description: acute  She developed respiratory symptoms approximately 2 days ago and is concerned, coronavirus?  Others?. She developed some sore throat, cough, headache and facial pressure. Taking Mucinex and Zyrtec.  No recent trips. 2 weeks ago some friends returned from a foreign trip to [place] and other countries , she saw them shortly after, they are not sick to her knowledge.  Review of Systems  Mild subjective fever?  No chills. No nasal discharge No sneezing No chest pain, no difficulty breathing. No nausea or vomiting No sputum production No headaches No generalized aches  Past Medical History:  Diagnosis Date  . Anaphylaxis 02/14/2011   Unknown cause.  Always carries Epipen.  . Angioedema   . Anxiety   . Arthritis   . Cough   . Depression    Dr. Abner Greenspan  . Exposure to hepatitis C   . GERD (gastroesophageal reflux disease)   . Hyperlipidemia   . Hypertension   . IBS (irritable bowel syndrome)   . Internal and external bleeding hemorrhoids   . Migraine   . PONV (postoperative nausea and vomiting)   . Rosacea   . Sleep apnea     Past Surgical History:  Procedure Laterality Date  . ABDOMINOPLASTY  2000  . APPENDECTOMY  2004  . AUGMENTATION MAMMAPLASTY Bilateral 2011  . [place]   x 2  . COLONOSCOPY  09/15/2008   internal and external hemorroids  . COSMETIC SURGERY     multiple  . HEMORRHOID BANDING  2014  . REDUCTION MAMMAPLASTY Bilateral 2002  . TONSILLECTOMY  1962  . TOTAL KNEE ARTHROPLASTY Right 12/26/2015   Procedure: TOTAL KNEE ARTHROPLASTY;  Surgeon: Paralee Cancel, MD;  Location: [facility];  Service: Orthopedics;  Laterality: Right;  . TOTAL SHOULDER REPLACEMENT Bilateral 2009   right  . TOTAL SHOULDER REPLACEMENT    . UPPER GASTROINTESTINAL ENDOSCOPY  11/26/2018    Social History    Socioeconomic History  . Marital status: Divorced    Spouse name: Not on file  . Number of children: Not on file  . Years of education: Not on file  . Highest education level: Not on file  Occupational History  . 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 Financial resource strain: Not on file  . Food insecurity:    Worry: Not on file    Inability: Not on file  . Transportation needs:    Medical: Not on file    Non-medical: Not on file  Tobacco Use  . Smoking status: Never Smoker  . Smokeless tobacco: Never Used  Substance and Sexual Activity  . Alcohol use: Yes    Alcohol/week: 1.0 standard drinks    Types: 1 Glasses of wine per week  . Drug use: No  . Sexual activity: Not Currently  Lifestyle  . Physical activity:    Days per week: Not on file    Minutes per session: Not on file  . Stress: Not on file  Relationships  . Social connections:    Talks on phone: Not on file    Gets together: Not on file    Attends religious service: Not on file    Active member of club or organization: Not on file    Attends meetings of clubs or  organizations: Not on file    Relationship status: Not on file  . Intimate partner violence:    Fear of current or ex partner: Not on file    Emotionally abused: Not on file    Physically abused: Not on file    Forced sexual activity: Not on file  Other Topics Concern  . Not on file  Social History Narrative   She is divorced and retired from [employer]   1 cup of coffee and frequent tea consumption   Daughter lives in area   1 drink a week no drugs no tobacco      Allergies as of 12/23/2018      Reactions   Ace Inhibitors    REACTION: cough      Medication List       Accurate as of December 23, 2018 11:59 PM. Always use your most recent med list.        azelastine 0.1 % nasal spray Commonly known as:  ASTELIN Place 2 sprays into both nostrils 2 (two) times daily.   b complex vitamins  tablet Take 1 tablet by mouth daily.   buPROPion 300 MG 24 hr tablet Commonly known as:  WELLBUTRIN XL Take 300 mg by mouth daily.   cetirizine 10 MG tablet Commonly known as:  ZYRTEC Take 10 mg by mouth daily as needed for allergies. Reported on 02/27/2016   cholecalciferol 1000 units tablet Commonly known as:  VITAMIN D Take 1,000 Units by mouth daily.   eletriptan 20 MG tablet Commonly known as:  RELPAX Take 1 tablet (20 mg total) by mouth as needed. may repeat in 2 hours if necessary   EPINEPHrine 0.3 mg/0.3 mL Soaj injection Commonly known as:  EpiPen 2-Pak INJECT 0.3 MLS (0.3 MG TOTAL) INTO THE MUSCLE ONCE.   esomeprazole 20 MG capsule Commonly known as:  NEXIUM Take 20 mg by mouth daily at 12 noon.   ferrous sulfate 325 (65 FE) MG tablet Take 1 tablet (325 mg total) by mouth daily with breakfast.   hydrocortisone 2.5 % rectal cream Commonly known as:  ANUSOL-HC APPLY RECTALLY TWICE A DAY   ibuprofen 200 MG tablet Commonly known as:  ADVIL,MOTRIN Take by mouth.   Lexapro 20 MG tablet Generic drug:  escitalopram Take 20 mg by mouth daily.           Objective:   Physical Exam BP 126/68 (BP Location: Left Arm, Patient Position: Sitting, Cuff Size: Small)   Pulse 86   Temp 98.6 F (37 C) (Oral)   Resp 16   Ht 5' (1.524 m)   Wt 166 lb 4 oz (75.4 kg)   SpO2 97%   BMI 32.47 kg/m  General:   Well developed, NAD, BMI noted. HEENT:  Normocephalic . Face symmetric, atraumatic. TMs normal.  Throat symmetric and not red.  Nose not congested.  Sinuses: Minimal TTP at both maxillary areas Lungs:  CTA B Normal respiratory effort, no intercostal retractions, no accessory muscle use. Heart: RRR,  no murmur.  No pretibial edema bilaterally  Skin: Not pale. Not jaundice Neurologic:  alert & oriented X3.  Speech normal, gait appropriate for age and unassisted Psych--  Cognition and judgment appear intact.  Cooperative with normal attention span and  concentration.  Behavior appropriate. No anxious or depressed appearing.      Assessment     73 year old female, PMH includes migraines, GERD, history of anaphylaxis, hyperlipidemia, presents with URI versus allergies: Has mild upper respiratory symptoms, exam is benign,  possibly allergies versus a mild URI. She is concerned about a major infection, no evidence of influenza or coronavirus. She was in contact with some friends to came back from [place] and other countries , recommend to check on them, if they become sick and the patient herself gets sick she will need to contact us. Continue Zyrtec, add Flonase, Astelin.  See AVS

## 2018-12-23 NOTE — Patient Instructions (Addendum)
Rest, fluids , tylenol  For cough:  Take Mucinex DM twice a day as needed until better  For nasal congestion: Use OTC Nasocort or Flonase : 2 nasal sprays on each side of the nose in the morning until you feel better Use ASTELIN a prescribed spray : 2 nasal sprays twice a day  until you feel better   Avoid decongestants such as  Pseudoephedrine or phenylephrine   Call if not gradually better over the next few days  Call anytime if the symptoms are severe

## 2018-12-23 NOTE — Progress Notes (Signed)
Pre visit review using our clinic review tool, if applicable. No additional management support is needed unless otherwise documented below in the visit note. 

## 2018-12-24 ENCOUNTER — Ambulatory Visit: Payer: Medicare Other | Admitting: Family Medicine

## 2019-06-12 DIAGNOSIS — L853 Xerosis cutis: Secondary | ICD-10-CM | POA: Diagnosis not present

## 2019-06-12 DIAGNOSIS — L718 Other rosacea: Secondary | ICD-10-CM | POA: Diagnosis not present

## 2019-06-12 DIAGNOSIS — L821 Other seborrheic keratosis: Secondary | ICD-10-CM | POA: Diagnosis not present

## 2019-06-12 DIAGNOSIS — L814 Other melanin hyperpigmentation: Secondary | ICD-10-CM | POA: Diagnosis not present

## 2019-06-12 DIAGNOSIS — L570 Actinic keratosis: Secondary | ICD-10-CM | POA: Diagnosis not present

## 2019-06-17 DIAGNOSIS — Z23 Encounter for immunization: Secondary | ICD-10-CM | POA: Diagnosis not present

## 2019-07-03 DIAGNOSIS — L570 Actinic keratosis: Secondary | ICD-10-CM | POA: Diagnosis not present

## 2019-10-06 ENCOUNTER — Telehealth: Payer: Self-pay | Admitting: Family Medicine

## 2019-10-06 NOTE — Telephone Encounter (Signed)
Please advise 

## 2019-10-06 NOTE — Telephone Encounter (Signed)
Pt called in asking what if Cassandra Little thinks it is safe for her to get the Covid vaccine due to her having an anaphylactic related symptoms when getting vaccinated. Pt can be reached at the home # please advise.  I did make the pt aware that we are limited to the information on the vaccine and the locations that will be given the shot to the public.

## 2019-10-07 NOTE — Telephone Encounter (Signed)
This would be a question to ask her allergist

## 2019-10-07 NOTE — Telephone Encounter (Signed)
Called and advised pt, she stated an understanding.

## 2019-11-11 ENCOUNTER — Telehealth: Payer: Self-pay | Admitting: Allergy

## 2019-11-11 NOTE — Telephone Encounter (Signed)
Patient called with questions about getting her Covid vaccine. She said she has anaphylactic reactions, but doesn't know why. She has been tested by Dr. Maudie Mercury, but still doesn't know what she is allergic to. She carries an Epi Pen everywhere she goes. She would like to know which shot would be best. Levan Hurst or [employer].

## 2019-11-11 NOTE — Telephone Encounter (Signed)
Spoke to patient and informed her of her lab results and asked her if she's ever had an anaphylaxis reaction to any vaccine, she stated no and she isn't allergic to polyphenol glycol or miralax, I then informed her she should be safe to take either vaccine. She's scheduled to get her Covid-19 vaccine Monday February 1,2021.

## 2019-11-12 ENCOUNTER — Ambulatory Visit: Payer: Medicare Other

## 2019-11-17 ENCOUNTER — Ambulatory Visit: Payer: Medicare Other

## 2019-11-20 ENCOUNTER — Ambulatory Visit: Payer: Medicare Other

## 2019-12-04 ENCOUNTER — Telehealth: Payer: Self-pay

## 2019-12-04 NOTE — Telephone Encounter (Signed)
Congrats on the new baby!  She needs to finish her COVID vaccine series prior to getting her Tdap.  She can get her Tdap from the pharmacy 14 days after her last COVID shot

## 2019-12-04 NOTE — Telephone Encounter (Signed)
Patient called in and states she has a new grandchild. She said she received her 1st covid vaccine 11/17/19 and will receive her next vaccine on 12/17/2019. She would like to know if she is able to get her Tdap vaccine and when would be a good time for her to get it since she is in the middle of getting the covid vaccines.

## 2019-12-04 NOTE — Telephone Encounter (Signed)
Pt informed of PCP recommendations and stated an understanding.

## 2020-01-01 ENCOUNTER — Telehealth: Payer: Self-pay

## 2020-01-01 NOTE — Telephone Encounter (Signed)
Patient request to be sent to [facility]:  eletriptan (RELPAX) 20 MG tablet EPINEPHrine (EPIPEN 2-PAK) 0.3 mg/0.3 mL IJ SOAJ injection  hydrocortisone (ANUSOL-HC) 2.5 % rectal cream

## 2020-01-06 ENCOUNTER — Encounter: Payer: Self-pay | Admitting: Obstetrics & Gynecology

## 2020-06-16 DIAGNOSIS — L814 Other melanin hyperpigmentation: Secondary | ICD-10-CM | POA: Diagnosis not present

## 2020-06-16 DIAGNOSIS — L821 Other seborrheic keratosis: Secondary | ICD-10-CM | POA: Diagnosis not present

## 2020-06-16 DIAGNOSIS — L570 Actinic keratosis: Secondary | ICD-10-CM | POA: Diagnosis not present

## 2020-06-16 DIAGNOSIS — L820 Inflamed seborrheic keratosis: Secondary | ICD-10-CM | POA: Diagnosis not present

## 2020-08-09 DIAGNOSIS — Z23 Encounter for immunization: Secondary | ICD-10-CM | POA: Diagnosis not present

## 2020-08-12 ENCOUNTER — Telehealth: Payer: Self-pay | Admitting: Allergy

## 2020-08-12 NOTE — Telephone Encounter (Signed)
Patient was last seen 11/27/18. She is requesting an EpiPen. [facility].

## 2020-08-12 NOTE — Telephone Encounter (Signed)
Pt needs an ov

## 2020-08-12 NOTE — Telephone Encounter (Signed)
Pt is coming on on 08/17/2020. Will send in refill then

## 2020-08-17 ENCOUNTER — Ambulatory Visit (INDEPENDENT_AMBULATORY_CARE_PROVIDER_SITE_OTHER): Payer: Medicare Other | Admitting: Family Medicine

## 2020-08-17 ENCOUNTER — Other Ambulatory Visit: Payer: Self-pay

## 2020-08-17 ENCOUNTER — Encounter: Payer: Self-pay | Admitting: Family Medicine

## 2020-08-17 DIAGNOSIS — J3089 Other allergic rhinitis: Secondary | ICD-10-CM | POA: Diagnosis not present

## 2020-08-17 DIAGNOSIS — T783XXD Angioneurotic edema, subsequent encounter: Secondary | ICD-10-CM

## 2020-08-17 DIAGNOSIS — T782XXD Anaphylactic shock, unspecified, subsequent encounter: Secondary | ICD-10-CM | POA: Diagnosis not present

## 2020-08-17 MED ORDER — EPINEPHRINE 0.3 MG/0.3ML IJ SOAJ: INTRAMUSCULAR | 1 refills | Status: DC

## 2020-08-17 NOTE — Patient Instructions (Signed)
Allergic rhinitis Continue avoidance measures directed toward cat, grass, tree pollen, grass pollen, weed pollen, and dog Continue cetirizine 10 mg once a day as needed for runny nose or itch. Remember to rotate to a different antihistamine about every 3 months. Some examples of over the counter antihistamines include Zyrtec (cetirizine), Xyzal (levocetirizine), Allegra (fexofenadine), and Claritin (loratidine).   Angioedema If your symptoms re-occur, begin a journal of events that occurred for up to 6 hours before your symptoms began including foods and beverages consumed, soaps or perfumes you had contact with, and medications.    Allergic reaction In case of an allergic reaction, take Benadryl 50 mg every 4 hours, and if life-threatening symptoms occur, inject with EpiPen 0.3 mg. Continue to keep your EpiPen's and is set to  Call the clinic if this treatment plan is not working well for you  Follow up in 1 year or sooner if needed.

## 2020-08-17 NOTE — Progress Notes (Addendum)
RE: Cassandra Little MRN: [id number] DOB: [birth year] Date of Telemedicine Visit: 08/17/2020  Referring provider: Midge Minium, MD Primary care provider: Midge Minium, MD  Chief Complaint: Allergies   Telemedicine Follow Up Visit via Telephone: I connected with Cassandra Little for a follow up on 08/17/20 by telephone and verified that I am speaking with the correct person using two identifiers.   I discussed the limitations, risks, security and privacy concerns of performing an evaluation and management service by telephone and the availability of in person appointments. I also discussed with the patient that there may be a patient responsible charge related to this service. The patient expressed understanding and agreed to proceed.  Patient is at home  Provider is at the office.  Visit start time: 794 Visit end time: 1000 Insurance consent/check in by: [facility] consent and medical assistant/nurse: Laporshe  History of Present Illness: She is a 74 y.o. female, who is being followed for allergic rhinitis, angioedema, and idiopathic anaphylactic reactions. Her previous allergy office visit was on 11/27/2018 with Dr. Maudie Mercury.  At today's visit, she reports that she has not had any angioedema or anaphylactic reactions nor has she needed to use her EpiPen over the last 6 years.  She reports allergic rhinitis has been well controlled while continuing allergen avoidance measures and cetirizine as needed.  Her current medications are listed in the chart.  Assessment and Plan: Cassandra Little is a 74 y.o. female with: Patient Instructions  Allergic rhinitis Continue avoidance measures directed toward cat, grass, tree pollen, grass pollen, weed pollen, and dog Continue cetirizine 10 mg once a day as needed for runny nose or itch. Remember to rotate to a different antihistamine about every 3 months. Some examples of over the counter antihistamines include Zyrtec (cetirizine), Xyzal  (levocetirizine), Allegra (fexofenadine), and Claritin (loratidine).   Angioedema If your symptoms re-occur, begin a journal of events that occurred for up to 6 hours before your symptoms began including foods and beverages consumed, soaps or perfumes you had contact with, and medications.    Allergic reaction In case of an allergic reaction, take Benadryl 50 mg every 4 hours, and if life-threatening symptoms occur, inject with EpiPen 0.3 mg and call 911 immediately.  Call the clinic if this treatment plan is not working well for you  Follow up in 1 year or sooner if needed.    Return in about 1 year (around 08/17/2021), or if symptoms worsen or fail to improve.  Meds ordered this encounter  Medications  . EPINEPHrine (EPIPEN 2-PAK) 0.3 mg/0.3 mL IJ SOAJ injection    Sig: INJECT 0.3 MLS (0.3 MG TOTAL) INTO THE MUSCLE ONCE.    Dispense:  2 each    Refill:  1    Medication List:  Current Outpatient Medications  Medication Sig Dispense Refill  . b complex vitamins tablet Take 1 tablet by mouth daily.    Marland Kitchen buPROPion (WELLBUTRIN XL) 300 MG 24 hr tablet Take 300 mg by mouth daily.     . cetirizine (ZYRTEC) 10 MG tablet Take 10 mg by mouth daily as needed for allergies. Reported on 02/27/2016    . cholecalciferol (VITAMIN D) 1000 units tablet Take 1,000 Units by mouth daily.    Marland Kitchen escitalopram (LEXAPRO) 20 MG tablet Take 20 mg by mouth daily.    Marland Kitchen esomeprazole (NEXIUM) 20 MG capsule Take 20 mg by mouth daily at 12 noon.    Marland Kitchen ibuprofen (ADVIL,MOTRIN) 200 MG tablet Take by mouth.    Marland Kitchen  azelastine (ASTELIN) 0.1 % nasal spray Place 2 sprays into both nostrils 2 (two) times daily. (Patient not taking: Reported on 08/17/2020) 30 mL 6  . eletriptan (RELPAX) 20 MG tablet Take 1 tablet (20 mg total) by mouth as needed. may repeat in 2 hours if necessary (Patient not taking: Reported on 08/17/2020) 10 tablet 2  . EPINEPHrine (EPIPEN 2-PAK) 0.3 mg/0.3 mL IJ SOAJ injection INJECT 0.3 MLS (0.3 MG TOTAL)  INTO THE MUSCLE ONCE. 2 each 1  . ferrous sulfate 325 (65 FE) MG tablet Take 1 tablet (325 mg total) by mouth daily with breakfast. (Patient not taking: Reported on 08/17/2020)  3  . hydrocortisone (ANUSOL-HC) 2.5 % rectal cream APPLY RECTALLY TWICE A DAY (Patient not taking: Reported on 08/17/2020) 30 g 0   No current facility-administered medications for this visit.   Allergies: Allergies  Allergen Reactions  . Ace Inhibitors     REACTION: cough   I reviewed her past medical history, social history, family history, and environmental history and no significant changes have been reported from previous visit on 11/27/2018.  Objective: Physical Exam Not obtained as encounter was done via telephone.   Previous notes and tests were reviewed.  I discussed the assessment and treatment plan with the patient. The patient was provided an opportunity to ask questions and all were answered. The patient agreed with the plan and demonstrated an understanding of the instructions.   The patient was advised to call back or seek an in-person evaluation if the symptoms worsen or if the condition fails to improve as anticipated.  I provided 15 minutes of non-face-to-face time during this encounter.  It was my pleasure to participate in [facility] care today. Please feel free to contact me with any questions or concerns.   Sincerely,  Gareth Morgan, FNP   ________________________________________________  I have provided oversight concerning Cassandra Little's evaluation and treatment of this patient's health issues addressed during today's encounter.  I agree with the assessment and therapeutic plan as outlined in the note.   Signed,   R Edgar Frisk, MD

## 2020-12-02 DIAGNOSIS — M1712 Unilateral primary osteoarthritis, left knee: Secondary | ICD-10-CM | POA: Diagnosis not present

## 2020-12-02 DIAGNOSIS — Z96651 Presence of right artificial knee joint: Secondary | ICD-10-CM | POA: Diagnosis not present

## 2020-12-05 ENCOUNTER — Telehealth: Payer: Self-pay | Admitting: Family Medicine

## 2020-12-05 NOTE — Telephone Encounter (Signed)
Error

## 2020-12-27 ENCOUNTER — Ambulatory Visit (INDEPENDENT_AMBULATORY_CARE_PROVIDER_SITE_OTHER): Payer: Medicare Other | Admitting: Family Medicine

## 2020-12-27 ENCOUNTER — Encounter: Payer: Self-pay | Admitting: Family Medicine

## 2020-12-27 ENCOUNTER — Other Ambulatory Visit: Payer: Self-pay

## 2020-12-27 VITALS — BP 134/80 | HR 78 | Temp 99.1°F | Resp 19 | Ht 60.0 in | Wt 160.0 lb

## 2020-12-27 DIAGNOSIS — E785 Hyperlipidemia, unspecified: Secondary | ICD-10-CM | POA: Diagnosis not present

## 2020-12-27 DIAGNOSIS — D509 Iron deficiency anemia, unspecified: Secondary | ICD-10-CM

## 2020-12-27 DIAGNOSIS — E669 Obesity, unspecified: Secondary | ICD-10-CM | POA: Diagnosis not present

## 2020-12-27 DIAGNOSIS — E559 Vitamin D deficiency, unspecified: Secondary | ICD-10-CM

## 2020-12-27 LAB — HEPATIC FUNCTION PANEL
ALT: 21 U/L (ref 0–35)
AST: 18 U/L (ref 0–37)
Albumin: 4 g/dL (ref 3.5–5.2)
Alkaline Phosphatase: 72 U/L (ref 39–117)
Bilirubin, Direct: 0 mg/dL (ref 0.0–0.3)
Total Bilirubin: 0.3 mg/dL (ref 0.2–1.2)
Total Protein: 7 g/dL (ref 6.0–8.3)

## 2020-12-27 LAB — BASIC METABOLIC PANEL
BUN: 27 mg/dL — ABNORMAL HIGH (ref 6–23)
CO2: 27 mEq/L (ref 19–32)
Calcium: 9.3 mg/dL (ref 8.4–10.5)
Chloride: 106 mEq/L (ref 96–112)
Creatinine, Ser: 0.94 mg/dL (ref 0.40–1.20)
GFR: 60.59 mL/min (ref >60.00)
Glucose, Bld: 86 mg/dL (ref 70–99)
Potassium: 4.3 mEq/L (ref 3.5–5.1)
Sodium: 138 mEq/L (ref 135–145)

## 2020-12-27 LAB — CBC WITH DIFFERENTIAL/PLATELET
Basophils Absolute: 0 10*3/uL (ref 0.0–0.1)
Basophils Relative: 0.6 % (ref 0.0–3.0)
Eosinophils Absolute: 0.1 10*3/uL (ref 0.0–0.7)
Eosinophils Relative: 1.9 % (ref 0.0–5.0)
HCT: 40.6 % (ref 36.0–46.0)
Hemoglobin: 13.5 g/dL (ref 12.0–15.0)
Lymphocytes Relative: 36.1 % (ref 12.0–46.0)
Lymphs Abs: 2.1 10*3/uL (ref 0.7–4.0)
MCHC: 33.3 g/dL (ref 30.0–36.0)
MCV: 86.9 fl (ref 78.0–100.0)
Monocytes Absolute: 0.5 10*3/uL (ref 0.1–1.0)
Monocytes Relative: 8.6 % (ref 3.0–12.0)
Neutro Abs: 3.1 10*3/uL (ref 1.4–7.7)
Neutrophils Relative %: 52.8 % (ref 43.0–77.0)
Platelets: 245 10*3/uL (ref 150.0–400.0)
RBC: 4.67 Mil/uL (ref 3.87–5.11)
RDW: 14.5 % (ref 11.5–15.5)
WBC: 5.9 10*3/uL (ref 4.0–10.5)

## 2020-12-27 LAB — VITAMIN D 25 HYDROXY (VIT D DEFICIENCY, FRACTURES): VITD: 76.54 ng/mL (ref 30.00–100.00)

## 2020-12-27 LAB — LIPID PANEL
Cholesterol: 225 mg/dL — ABNORMAL HIGH (ref 0–200)
HDL: 56 mg/dL (ref >39.00)
LDL Cholesterol: 135 mg/dL — ABNORMAL HIGH (ref 0–99)
NonHDL: 168.67
Total CHOL/HDL Ratio: 4
Triglycerides: 170 mg/dL — ABNORMAL HIGH (ref 0.0–149.0)
VLDL: 34 mg/dL (ref 0.0–40.0)

## 2020-12-27 LAB — TSH: TSH: 1.81 u[IU]/mL (ref 0.35–4.50)

## 2020-12-27 MED ORDER — EPINEPHRINE 0.3 MG/0.3ML IJ SOAJ: INTRAMUSCULAR | 1 refills | Status: DC

## 2020-12-27 MED ORDER — EPINEPHRINE 0.3 MG/0.3ML IJ SOAJ
INTRAMUSCULAR | 1 refills | Status: DC
Start: 2020-12-27 — End: 2022-07-09

## 2020-12-27 NOTE — Progress Notes (Signed)
   Subjective:    Patient ID: Cassandra Little, female    DOB: Nov 24, 1945, 75 y.o.   MRN: [id number]  HPI Obesity- pt's BMI is 31.25.  Pilates once weekly, working w/ trainer twice weekly, and yoga twice weekly.  Pt is not following a particular diet but prefers Mediterranean diet.  Hyperlipidemia- pt has hx of elevated LDL.  Last checked 2 yrs ago and LDL was 120.  No CP, SOB, abd pain, N/V.   Iron deficiency anemia- last Hgb 3 yrs ago was 10.5 and iron was low at 16.  UTD on colonoscopy.  No longer taking iron regularly.  Denies excessive fatigue.  Health Maintenance- UTD on colonoscopy, due for mammo (pt plans to schedule w/ Dr Sabra Heck)  Review of Systems For ROS see HPI   This visit occurred during the SARS-CoV-2 public health emergency.  Safety protocols were in place, including screening questions prior to the visit, additional usage of staff PPE, and extensive cleaning of exam room while observing appropriate contact time as indicated for disinfecting solutions.       Objective:   Physical Exam Vitals reviewed.  Constitutional:      General: She is not in acute distress.    Appearance: Normal appearance. She is well-developed. She is obese. She is not ill-appearing.  HENT:     Head: Normocephalic and atraumatic.  Eyes:     Conjunctiva/sclera: Conjunctivae normal.     Pupils: Pupils are equal, round, and reactive to light.  Neck:     Thyroid: No thyromegaly.  Cardiovascular:     Rate and Rhythm: Normal rate and regular rhythm.     Pulses: Normal pulses.     Heart sounds: Normal heart sounds. No murmur heard.   Pulmonary:     Effort: Pulmonary effort is normal. No respiratory distress.     Breath sounds: Normal breath sounds.  Abdominal:     General: There is no distension.     Palpations: Abdomen is soft.     Tenderness: There is no abdominal tenderness.  Musculoskeletal:     Cervical back: Normal range of motion and neck supple.     Right lower leg: No edema.      Left lower leg: No edema.  Lymphadenopathy:     Cervical: No cervical adenopathy.  Skin:    General: Skin is warm and dry.  Neurological:     Mental Status: She is alert and oriented to person, place, and time.  Psychiatric:        Behavior: Behavior normal.           Assessment & Plan:

## 2020-12-27 NOTE — Assessment & Plan Note (Signed)
BMI is 31.25  She is exercising regularly and I applauded her efforts.  Check labs to risk stratify.  Will follow.

## 2020-12-27 NOTE — Patient Instructions (Addendum)
Follow up in 1 year or as needed We'll notify you of your lab results and make any changes if needed Continue to work on healthy diet and regular exercise- you're doing great!!! Call and schedule your mammogram at your convenience Call with any questions or concerns Stay Safe!  Stay Healthy! Enjoy your travels!!!

## 2020-12-27 NOTE — Assessment & Plan Note (Signed)
Last LDL was 120.  Currently exercising regularly and attempting to eat a Mediterranean diet.  Check labs and determine if medication is needed.

## 2020-12-27 NOTE — Assessment & Plan Note (Signed)
Pt has hx of this.  Check labs and replete prn. 

## 2020-12-27 NOTE — Assessment & Plan Note (Signed)
Pt's last Hgb was 10.5 and iron was low at 16.  Denies excessive fatigue.  Not taking iron regularly.  Check labs and restart iron supplement if needed.

## 2020-12-28 ENCOUNTER — Telehealth: Payer: Self-pay | Admitting: Family Medicine

## 2020-12-28 LAB — IRON,TIBC AND FERRITIN PANEL
%SAT: 32 % (calc) (ref 16–45)
Ferritin: 26 ng/mL (ref 16–288)
Iron: 120 ug/dL (ref 45–160)
TIBC: 370 mcg/dL (calc) (ref 250–450)

## 2020-12-28 NOTE — Telephone Encounter (Signed)
It is not recommended to drink grapefruit juice (or eat grapefruit) regularly while on Red Yeast Rice or a prescription statin.  I am not worried about the muscle aches at all.  It is her choice as to how she wants to proceed.  She can either stop the grapefruit juice and start the Red Yeast Rice or continue drinking the grapefruit juice while working on healthy diet and regular exercise.  However, if cholesterol continues to rise, at that point prescription medication will need to be discussed and at that time, grapefruit would be off the table

## 2020-12-28 NOTE — Telephone Encounter (Signed)
Spoke with patient and she stated that she drinks grape fruit juice and has muscle aches. So that was the main reason she asked if the red yeast rice was ok to take. She stated that she will take it if you think that it is safe with those 2 factors.

## 2020-12-28 NOTE — Telephone Encounter (Signed)
Please advise patient on this. Would like to know what she can take other than the red yeast rice.

## 2020-12-28 NOTE — Telephone Encounter (Signed)
Called and left a message on patient vm about concerns of the red yeast rice. Patient will return call to office if there are any questions further questions or concerns.

## 2020-12-28 NOTE — Telephone Encounter (Signed)
If she is not interested in taking the Red Yeast Rice, lifestyle modification is the way to go.  We will continue to monitor and if needed, can discuss prescription medications in the future

## 2020-12-28 NOTE — Telephone Encounter (Signed)
Patient called stating that she got the message to start the red rice yeast tablets, but said that she drinks grape fruit juice everyday and that she also has muscle aches and that she read that she shouldn't take it because of these two factors.  Please advise.

## 2021-01-05 ENCOUNTER — Ambulatory Visit: Payer: Medicare Other | Admitting: Family Medicine

## 2021-01-19 ENCOUNTER — Telehealth: Payer: Self-pay | Admitting: Family Medicine

## 2021-01-19 NOTE — Telephone Encounter (Signed)
Spoke with patient she state she will be out of town this month and to call back in May

## 2021-02-23 DIAGNOSIS — Z23 Encounter for immunization: Secondary | ICD-10-CM | POA: Diagnosis not present

## 2021-03-11 DIAGNOSIS — J069 Acute upper respiratory infection, unspecified: Secondary | ICD-10-CM | POA: Diagnosis not present

## 2021-03-16 NOTE — Progress Notes (Signed)
Subjective:   Cassandra Little is a 75 y.o. female who presents for Medicare Annual (Subsequent) preventive examination.  Virtual Visit via Telephone Note  I connected with  Cassandra Little on 03/20/21 at  9:00 AM EDT by telephone and verified that I am speaking with the correct person using two identifiers.  Location: Patient: HOME Provider: [place] Persons participating in the virtual visit: patient/Nurse Health Advisor   I discussed the limitations, risks, security and privacy concerns of performing an evaluation and management service by telephone and the availability of in person appointments. The patient expressed understanding and agreed to proceed.  Interactive audio and video telecommunications were attempted between this nurse and patient, however failed, due to patient having technical difficulties OR patient did not have access to video capability.  We continued and completed visit with audio only.  Some vital signs may be absent or patient reported.   Cassandra Kennedy, LPN   Review of Systems    N/A Cardiac Risk Factors include: advanced age (>57men, >28 women);dyslipidemia;hypertension     Objective:    Today's Vitals   03/20/21 0803  Weight: 160 lb (72.6 kg)  Height: 5' (1.524 m)   Body mass index is 31.25 kg/m.  Advanced Directives 03/20/2021 02/20/2017 12/26/2015 12/12/2015  Does Patient Have a Medical Advance Directive? Yes Yes Yes Yes  Type of Advance Directive Healthcare Power of Attorney Living will;Healthcare Power of [place];Living will [place];Living will  Does patient want to make changes to medical advance directive? - - No - Patient declined No - Patient declined  Copy of [place] in Chart? No - copy requested No - copy requested Yes Yes    Current Medications (verified) Outpatient Encounter Medications as of 03/20/2021  Medication Sig  . b complex vitamins tablet Take 1 tablet by  mouth daily.  . cetirizine (ZYRTEC) 10 MG tablet Take 10 mg by mouth daily as needed for allergies. Reported on 02/27/2016  . cholecalciferol (VITAMIN D) 1000 units tablet Take 1,000 Units by mouth daily.  Marland Kitchen EPINEPHrine (EPIPEN 2-PAK) 0.3 mg/0.3 mL IJ SOAJ injection INJECT 0.3 MLS (0.3 MG TOTAL) INTO THE MUSCLE ONCE.  Marland Kitchen escitalopram (LEXAPRO) 20 MG tablet Take 20 mg by mouth daily.  Marland Kitchen esomeprazole (NEXIUM) 20 MG capsule Take 20 mg by mouth daily at 12 noon.  Marland Kitchen ibuprofen (ADVIL,MOTRIN) 200 MG tablet Take by mouth.  Marland Kitchen buPROPion (WELLBUTRIN XL) 300 MG 24 hr tablet Take 300 mg by mouth daily.    No facility-administered encounter medications on file as of 03/20/2021.    Allergies (verified) Ace inhibitors   History: Past Medical History:  Diagnosis Date  . Anaphylaxis 02/14/2011   Unknown cause.  Always carries Epipen.  . Angioedema   . Anxiety   . Arthritis   . Cough   . Depression    Dr. Abner Greenspan  . Exposure to hepatitis C   . GERD (gastroesophageal reflux disease)   . Hyperlipidemia   . Hypertension   . IBS (irritable bowel syndrome)   . Internal and external bleeding hemorrhoids   . Migraine   . PONV (postoperative nausea and vomiting)   . Rosacea   . Sleep apnea    Past Surgical History:  Procedure Laterality Date  . ABDOMINOPLASTY  2000  . APPENDECTOMY  2004  . AUGMENTATION MAMMAPLASTY Bilateral 2011  . [place]   x 2  . COLONOSCOPY  09/15/2008   internal and external  hemorroids  . COSMETIC SURGERY     multiple  . HEMORRHOID BANDING  2014  . REDUCTION MAMMAPLASTY Bilateral 2002  . TONSILLECTOMY  1962  . TOTAL KNEE ARTHROPLASTY Right 12/26/2015   Procedure: TOTAL KNEE ARTHROPLASTY;  Surgeon: Paralee Cancel, MD;  Location: [facility];  Service: Orthopedics;  Laterality: Right;  . TOTAL SHOULDER REPLACEMENT Bilateral 2009   right  . TOTAL SHOULDER REPLACEMENT    . UPPER GASTROINTESTINAL ENDOSCOPY  11/26/2018   Family History  Problem Relation Age of  Onset  . Hyperlipidemia Mother   . Heart disease Mother   . Allergic rhinitis Mother   . Eczema Mother   . Diabetes Father   . Hypertension Father   . Heart attack Father        54s  . Heart disease Father   . Obesity Sister   . Hypertension Sister   . Hyperlipidemia Sister   . Colon cancer Neg Hx   . Stomach cancer Neg Hx   . Rectal cancer Neg Hx   . Pancreatic cancer Neg Hx   . Esophageal cancer Neg Hx   . Angioedema Neg Hx   . Asthma Neg Hx   . Atopy Neg Hx   . Immunodeficiency Neg Hx   . Urticaria Neg Hx    Social History   Socioeconomic History  . Marital status: Divorced    Spouse name: Not on file  . Number of children: Not on file  . Years of education: Not on file  . Highest education level: Not on file  Occupational History  . 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 Smoking status: Never Smoker  . Smokeless tobacco: Never Used  Vaping Use  . Vaping Use: Never used  Substance and Sexual Activity  . Alcohol use: Yes    Alcohol/week: 1.0 standard drink    Types: 1 Glasses of wine per week  . Drug use: No  . Sexual activity: Not Currently  Other Topics Concern  . Not on file  Social History Narrative   She is divorced and retired from [employer]   1 cup of coffee and frequent tea consumption   Daughter lives in area   1 drink a week no drugs no tobacco   Social Determinants of Health   Financial Resource Strain: Low Risk   . Difficulty of Paying Living Expenses: Not very hard  Food Insecurity: No Food Insecurity  . Worried About Charity fundraiser in the Last Year: Never true  . Ran Out of Food in the Last Year: Never true  Transportation Needs: No Transportation Needs  . Lack of Transportation (Medical): No  . Lack of Transportation (Non-Medical): No  Physical Activity: Sufficiently Active  . Days of Exercise per Week: 6 days  . Minutes of Exercise per Session: 40 min  Stress: No Stress Concern Present  .  Feeling of Stress : Not at all  Social Connections: Moderately Integrated  . Frequency of Communication with Friends and Family: More than three times a week  . Frequency of Social Gatherings with Friends and Family: More than three times a week  . Attends Religious Services: More than 4 times per year  . Active Member of Clubs or Organizations: Yes  . Attends Archivist Meetings: More than 4 times per year  . Marital Status: Divorced    Tobacco Counseling Counseling given: Not Answered   Clinical Intake:  Pre-visit preparation completed: Yes  Pain : No/denies pain     Nutritional Risks: Unintentional weight loss Diabetes: No  How often do you need to have someone help you when you read instructions, pamphlets, or other written materials from your doctor or pharmacy?: 1 - Never  Diabetic?NO  Interpreter Needed?: No  Comments: Joycelyn Liska LPN   Activities of Daily Living In your present state of health, do you have any difficulty performing the following activities: 03/20/2021 12/27/2020  Hearing? N N  Vision? N N  Difficulty concentrating or making decisions? N N  Walking or climbing stairs? N N  Dressing or bathing? N N  Doing errands, shopping? N N  Preparing Food and eating ? N -  Using the Toilet? N -  In the past six months, have you accidently leaked urine? N -  Do you have problems with loss of bowel control? N -  Managing your Medications? N -  Managing your Finances? N -  Housekeeping or managing your Housekeeping? N -  Some recent data might be hidden    Patient Care Team: Midge Minium, MD as PCP - General Paralee Cancel, MD as Consulting Physician (Orthopedic Surgery) Megan Salon, MD as Consulting Physician (Gynecology) Chucky May, MD as Consulting Physician (Psychiatry)  Indicate any recent Medical Services you may have received from other than [facility] providers in the past year (date may be approximate).     Assessment:    This is a routine wellness examination for [place].  Hearing/Vision screen  Hearing Screening   125Hz  250Hz  500Hz  1000Hz  2000Hz  3000Hz  4000Hz  6000Hz  8000Hz   Right ear:           Left ear:           Comments: No trouble hearing   Vision Screening Comments: Dr Bing Plume will be scheduling an appointment  Dietary issues and exercise activities discussed: Current Exercise Habits: Structured exercise class (yoga,pilates,), Type of exercise: strength training/weights;yoga, Time (Minutes): 40, Frequency (Times/Week): 6, Weekly Exercise (Minutes/Week): 240, Intensity: Intense, Exercise limited by: Other - see comments  Goals Addressed            This Visit's Progress   . Patient Stated       Loose 10 lbs      Depression Screen PHQ 2/9 Scores 03/20/2021 12/27/2020 02/20/2017 11/09/2015  PHQ - 2 Score 0 0 0 0  PHQ- 9 Score - 0 - -  Exception Documentation - - - Patient refusal    Fall Risk Fall Risk  03/20/2021 12/27/2020 12/23/2018 02/20/2017 11/09/2015  Falls in the past year? 0 0 0 No Yes  Number falls in past yr: 0 0 - - 1  Injury with Fall? 0 0 - - Yes  Comment - - - - Possible concusiion did not go to Dr.  Jeanmarie Plant for fall due to : No Fall Risks No Fall Risks - - Impaired balance/gait  Follow up Falls evaluation completed;Falls prevention discussed - - - Falls prevention discussed    FALL RISK PREVENTION PERTAINING TO THE HOME:  Any stairs in or around the home? Yes  If so, are there any without handrails? Yes  Home free of loose throw rugs in walkways, pet beds, electrical cords, etc? Yes  Adequate lighting in your home to reduce risk of falls? Yes   ASSISTIVE DEVICES UTILIZED TO PREVENT FALLS:  Life alert? No  Use of a cane, walker or w/c? No  Grab bars in the bathroom? No  Shower chair or bench in shower? No  Elevated toilet seat or a handicapped toilet? No     Cognitive Function:  Normal cognitive status assessed by direct observation by this Nurse Health Advisor. No  abnormalities found.         Immunizations Immunization History  Administered Date(s) Administered  . Hepatitis B 08/10/2005, 09/19/2005, 05/28/2006  . Influenza Inj Mdck Quad Pf 08/21/2017  . Influenza, High Dose Seasonal PF 09/24/2018  . Moderna Sars-Covid-2 Vaccination 11/17/2019, 12/17/2019, 08/09/2020  . Pneumococcal Conjugate-13 02/20/2017  . Pneumococcal Polysaccharide-23 05/27/2013  . Td 03/12/2002  . Tdap 03/12/2012  . Zoster Recombinat (Shingrix) 02/20/2017, 05/15/2017  . Zoster, Live 05/02/2009    TDAP status: Up to date  Flu Vaccine status: Up to date  Pneumococcal vaccine status: Up to date  Covid-19 vaccine status: Completed vaccines  Qualifies for Shingles Vaccine? Y  Zostavax completed Yes   Shingrix Completed?: Yes  Screening Tests Health Maintenance  Topic Date Due  . Pneumococcal Vaccine 28-63 Years old (1 of 4 - PCV13) Never done  . COVID-19 Vaccine (4 - Booster for Moderna series) 11/09/2020  . MAMMOGRAM  06/05/2021 (Originally 11/15/2019)  . INFLUENZA VACCINE  05/15/2021  . TETANUS/TDAP  03/12/2022  . DEXA SCAN  Completed  . Hepatitis C Screening  Completed  . PNA vac Low Risk Adult  Completed  . Zoster Vaccines- Shingrix  Completed  . HPV VACCINES  Aged Out    Health Maintenance  Health Maintenance Due  Topic Date Due  . Pneumococcal Vaccine 65-26 Years old (1 of 4 - PCV13) Never done  . COVID-19 Vaccine (4 - Booster for Moderna series) 11/09/2020    Colorectal cancer screening: Type of screening: Colonoscopy. Completed 2009. Repeat every 10 years  Mammogram status: Completed due for mammogram. Repeat every year  Bone Density status: Completed 08-31-2013. Results reflect: Bone density results: OSTEOPENIA. Repeat every 5 years.  Lung Cancer Screening: (Low Dose CT Chest recommended if Age 10-80 years, 30 pack-year currently smoking OR have quit w/in 15years.) does not qualify.   Lung Cancer Screening Referral: N/A  Additional  Screening:  Hepatitis C Screening: does qualify; Completed 02-27-2016  Vision Screening: Recommended annual ophthalmology exams for early detection of glaucoma and other disorders of the eye. Is the patient up to date with their annual eye exam?  Yes  Who is the provider or what is the name of the office in which the patient attends annual eye exams? Dr. Bing Plume If pt is not established with a provider, would they like to be referred to a provider to establish care? Yes .   Dental Screening: Recommended annual dental exams for proper oral hygiene  Community Resource Referral / Chronic Care Management: CRR required this visit?  No   CCM required this visit?  No      Plan:     I have personally reviewed and noted the following in the patient's chart:   . Medical and social history . Use of alcohol, tobacco or illicit drugs  . Current medications and supplements including opioid prescriptions.  . Functional ability and status . Nutritional status . Physical activity . Advanced directives . List of other physicians . Hospitalizations, surgeries, and ER visits in previous 12 months . Vitals . Screenings to include cognitive, depression, and falls . Referrals and appointments  In addition, I have reviewed and discussed with patient certain preventive protocols, quality metrics, and best practice recommendations. A written personalized care plan for preventive services as well as general preventive health recommendations were provided to patient.  Cassandra Kennedy, LPN   06/17/1120   Nurse Notes: n/a

## 2021-03-20 ENCOUNTER — Ambulatory Visit (INDEPENDENT_AMBULATORY_CARE_PROVIDER_SITE_OTHER): Payer: Medicare Other | Admitting: *Deleted

## 2021-03-20 VITALS — Ht 60.0 in | Wt 160.0 lb

## 2021-03-20 DIAGNOSIS — Z0000 Encounter for general adult medical examination without abnormal findings: Secondary | ICD-10-CM

## 2021-03-20 NOTE — Patient Instructions (Signed)
Cassandra Little , Thank you for taking time to come for your Medicare Wellness Visit. I appreciate your ongoing commitment to your health goals. Please review the following plan we discussed and let me know if I can assist you in the future.   Screening recommendations/referrals: Colonoscopy: completed 2009  Repeat 10 years Mammogram: overdue will call to schedule referral placed by OBGYN Bone Density: 08/31/2013 repeat 5 years Recommended yearly ophthalmology/optometry visit for glaucoma screening and checkup Recommended yearly dental visit for hygiene and checkup  Vaccinations: Influenza vaccine: up to date Pneumococcal vaccine: up to date Tdap vaccine: up to date Shingles vaccine: up to date  Advanced directives: yes copy requested  Conditions/risks identified: n/a  Next appointment:    Preventive Care 25 Years and Older, Female Preventive care refers to lifestyle choices and visits with your health care provider that can promote health and wellness. What does preventive care include?  A yearly physical exam. This is also called an annual well check.  Dental exams once or twice a year.  Routine eye exams. Ask your health care provider how often you should have your eyes checked.  Personal lifestyle choices, including:  Daily care of your teeth and gums.  Regular physical activity.  Eating a healthy diet.  Avoiding tobacco and drug use.  Limiting alcohol use.  Practicing safe sex.  Taking low-dose aspirin every day.  Taking vitamin and mineral supplements as recommended by your health care provider. What happens during an annual well check? The services and screenings done by your health care provider during your annual well check will depend on your age, overall health, lifestyle risk factors, and family history of disease. Counseling  Your health care provider may ask you questions about your:  Alcohol use.  Tobacco use.  Drug use.  Emotional  well-being.  Home and relationship well-being.  Sexual activity.  Eating habits.  History of falls.  Memory and ability to understand (cognition).  Work and work Statistician.  Reproductive health. Screening  You may have the following tests or measurements:  Height, weight, and BMI.  Blood pressure.  Lipid and cholesterol levels. These may be checked every 5 years, or more frequently if you are over 100 years old.  Skin check.  Lung cancer screening. You may have this screening every year starting at age 32 if you have a 30-pack-year history of smoking and currently smoke or have quit within the past 15 years.  Fecal occult blood test (FOBT) of the stool. You may have this test every year starting at age 78.  Flexible sigmoidoscopy or colonoscopy. You may have a sigmoidoscopy every 5 years or a colonoscopy every 10 years starting at age 76.  Hepatitis C blood test.  Hepatitis B blood test.  Sexually transmitted disease (STD) testing.  Diabetes screening. This is done by checking your blood sugar (glucose) after you have not eaten for a while (fasting). You may have this done every 1-3 years.  Bone density scan. This is done to screen for osteoporosis. You may have this done starting at age 68.  Mammogram. This may be done every 1-2 years. Talk to your health care provider about how often you should have regular mammograms. Talk with your health care provider about your test results, treatment options, and if necessary, the need for more tests. Vaccines  Your health care provider may recommend certain vaccines, such as:  Influenza vaccine. This is recommended every year.  Tetanus, diphtheria, and acellular pertussis (Tdap, Td) vaccine. You may  need a Td booster every 10 years.  Zoster vaccine. You may need this after age 60.  Pneumococcal 13-valent conjugate (PCV13) vaccine. One dose is recommended after age 31.  Pneumococcal polysaccharide (PPSV23) vaccine. One  dose is recommended after age 33. Talk to your health care provider about which screenings and vaccines you need and how often you need them. This information is not intended to replace advice given to you by your health care provider. Make sure you discuss any questions you have with your health care provider. Document Released: 10/28/2015 Document Revised: 06/20/2016 Document Reviewed: 08/02/2015 Elsevier Interactive Patient Education  2017 [place] Prevention in the Home Falls can cause injuries. They can happen to people of all ages. There are many things you can do to make your home safe and to help prevent falls. What can I do on the outside of my home?  Regularly fix the edges of walkways and driveways and fix any cracks.  Remove anything that might make you trip as you walk through a door, such as a raised step or threshold.  Trim any bushes or trees on the path to your home.  Use bright outdoor lighting.  Clear any walking paths of anything that might make someone trip, such as rocks or tools.  Regularly check to see if handrails are loose or broken. Make sure that both sides of any steps have handrails.  Any raised decks and porches should have guardrails on the edges.  Have any leaves, snow, or ice cleared regularly.  Use sand or salt on walking paths during winter.  Clean up any spills in your garage right away. This includes oil or grease spills. What can I do in the bathroom?  Use night lights.  Install grab bars by the toilet and in the tub and shower. Do not use towel bars as grab bars.  Use non-skid mats or decals in the tub or shower.  If you need to sit down in the shower, use a plastic, non-slip stool.  Keep the floor dry. Clean up any water that spills on the floor as soon as it happens.  Remove soap buildup in the tub or shower regularly.  Attach bath mats securely with double-sided non-slip rug tape.  Do not have throw rugs and other  things on the floor that can make you trip. What can I do in the bedroom?  Use night lights.  Make sure that you have a light by your bed that is easy to reach.  Do not use any sheets or blankets that are too big for your bed. They should not hang down onto the floor.  Have a firm chair that has side arms. You can use this for support while you get dressed.  Do not have throw rugs and other things on the floor that can make you trip. What can I do in the kitchen?  Clean up any spills right away.  Avoid walking on wet floors.  Keep items that you use a lot in easy-to-reach places.  If you need to reach something above you, use a strong step stool that has a grab bar.  Keep electrical cords out of the way.  Do not use floor polish or wax that makes floors slippery. If you must use wax, use non-skid floor wax.  Do not have throw rugs and other things on the floor that can make you trip. What can I do with my stairs?  Do not leave any items on  the stairs.  Make sure that there are handrails on both sides of the stairs and use them. Fix handrails that are broken or loose. Make sure that handrails are as long as the stairways.  Check any carpeting to make sure that it is firmly attached to the stairs. Fix any carpet that is loose or worn.  Avoid having throw rugs at the top or bottom of the stairs. If you do have throw rugs, attach them to the floor with carpet tape.  Make sure that you have a light switch at the top of the stairs and the bottom of the stairs. If you do not have them, ask someone to add them for you. What else can I do to help prevent falls?  Wear shoes that:  Do not have high heels.  Have rubber bottoms.  Are comfortable and fit you well.  Are closed at the toe. Do not wear sandals.  If you use a stepladder:  Make sure that it is fully opened. Do not climb a closed stepladder.  Make sure that both sides of the stepladder are locked into place.  Ask  someone to hold it for you, if possible.  Clearly mark and make sure that you can see:  Any grab bars or handrails.  First and last steps.  Where the edge of each step is.  Use tools that help you move around (mobility aids) if they are needed. These include:  Canes.  Walkers.  Scooters.  Crutches.  Turn on the lights when you go into a dark area. Replace any light bulbs as soon as they burn out.  Set up your furniture so you have a clear path. Avoid moving your furniture around.  If any of your floors are uneven, fix them.  If there are any pets around you, be aware of where they are.  Review your medicines with your doctor. Some medicines can make you feel dizzy. This can increase your chance of falling. Ask your doctor what other things that you can do to help prevent falls. This information is not intended to replace advice given to you by your health care provider. Make sure you discuss any questions you have with your health care provider. Document Released: 07/28/2009 Document Revised: 03/08/2016 Document Reviewed: 11/05/2014 Elsevier Interactive Patient Education  2017 [employer].

## 2021-03-28 ENCOUNTER — Ambulatory Visit (INDEPENDENT_AMBULATORY_CARE_PROVIDER_SITE_OTHER): Payer: Medicare Other | Admitting: Obstetrics & Gynecology

## 2021-03-28 ENCOUNTER — Ambulatory Visit (HOSPITAL_BASED_OUTPATIENT_CLINIC_OR_DEPARTMENT_OTHER)
Admission: RE | Admit: 2021-03-28 | Discharge: 2021-03-28 | Disposition: A | Discharge status: Home / Self Care | Payer: Medicare Other | Source: Ambulatory Visit | Attending: Obstetrics & Gynecology | Admitting: Obstetrics & Gynecology

## 2021-03-28 ENCOUNTER — Encounter (HOSPITAL_BASED_OUTPATIENT_CLINIC_OR_DEPARTMENT_OTHER): Payer: Self-pay | Admitting: Obstetrics & Gynecology

## 2021-03-28 ENCOUNTER — Other Ambulatory Visit: Payer: Self-pay

## 2021-03-28 VITALS — BP 144/81 | HR 72 | Ht 59.75 in | Wt 155.0 lb

## 2021-03-28 DIAGNOSIS — F32A Depression, unspecified: Secondary | ICD-10-CM

## 2021-03-28 DIAGNOSIS — Z9882 Breast implant status: Secondary | ICD-10-CM

## 2021-03-28 DIAGNOSIS — E785 Hyperlipidemia, unspecified: Secondary | ICD-10-CM | POA: Diagnosis not present

## 2021-03-28 DIAGNOSIS — Z1231 Encounter for screening mammogram for malignant neoplasm of breast: Secondary | ICD-10-CM

## 2021-03-28 DIAGNOSIS — Z01419 Encounter for gynecological examination (general) (routine) without abnormal findings: Secondary | ICD-10-CM | POA: Diagnosis not present

## 2021-03-28 DIAGNOSIS — N952 Postmenopausal atrophic vaginitis: Secondary | ICD-10-CM

## 2021-03-28 NOTE — Progress Notes (Signed)
75 y.o. G2P2 Divorced White or Caucasian female here for breast and pelvic exam.  Denies vaginal bleeding.  No SA.  Daughter is getting married over the 4th of July weekend. There are lots of family stressors with this wedding.    Has knee replacement scheduled with Dr. Alvan Dame in July.  Has trip planned in November for an international yoga retreat.  So, wants to have good flexibility.    Reports having some depression issues with year.  A new medication was added and she had side effects this year.  Not very happy with psychiatrist and is thinking about changing practices.    Would like suggestions for PCP as well.  Was recommended to add red yeast rice for cholesterol.  Pt doesn't really want to be on anything.  Drinks grapefruit juice and says "this is one of my life pleasures" but can't have with red yeast rice.  Was not advised of this risk and found on own.  Would like another opinion with cardiology.  Referral will be made.  She may be a candidate for coronary CT.  Lastly, still having issues with GERD.  Has undergone colonoscopy and EGD.  Just feels like she hasn't really gotten much improvement.  May decided to go to [facility] for another opinion.  No LMP recorded. Patient is postmenopausal.          Sexually active: No.  H/O STD:  yes  Health Maintenance: PCP:  Dr. Birdie Riddle.  Last wellness appt was 12/2020.  Did blood work at that appt:  yes Vaccines are up to date:  yes Colonoscopy:  11/26/2018, hyperplastic polyp, no follow up MMG:  11/14/2017 negative BMD:  08/31/2013 Last pap smear:  03/06/2017 Negative   H/o abnormal pap smear:  no    reports that she has never smoked. She has never used smokeless tobacco. She reports current alcohol use of about 1.0 standard drink of alcohol per week. She reports that she does not use drugs.  Past Medical History:  Diagnosis Date   Anaphylaxis 02/14/2011   Unknown cause.  Always carries Epipen.   Angioedema    Anxiety    Arthritis    Cough     Depression    Dr. Abner Greenspan   Exposure to hepatitis C    GERD (gastroesophageal reflux disease)    Hyperlipidemia    Hypertension    IBS (irritable bowel syndrome)    Internal and external bleeding hemorrhoids    Migraine    PONV (postoperative nausea and vomiting)    Rosacea    Sleep apnea     Past Surgical History:  Procedure Laterality Date   ABDOMINOPLASTY  2000   APPENDECTOMY  2004   AUGMENTATION MAMMAPLASTY Bilateral 2011   [place]   x 2   COLONOSCOPY  09/15/2008   internal and external hemorroids   COSMETIC SURGERY     multiple   HEMORRHOID BANDING  2014   REDUCTION MAMMAPLASTY Bilateral 2002   TONSILLECTOMY  1962   TOTAL KNEE ARTHROPLASTY Right 12/26/2015   Procedure: TOTAL KNEE ARTHROPLASTY;  Surgeon: Paralee Cancel, MD;  Location: [facility];  Service: Orthopedics;  Laterality: Right;   TOTAL SHOULDER REPLACEMENT Bilateral 2009   right   TOTAL SHOULDER REPLACEMENT     UPPER GASTROINTESTINAL ENDOSCOPY  11/26/2018    Current Outpatient Medications  Medication Sig Dispense Refill   b complex vitamins tablet Take 1 tablet by mouth daily.     cetirizine (ZYRTEC) 10 MG tablet  Take 10 mg by mouth daily as needed for allergies. Reported on 02/27/2016     cholecalciferol (VITAMIN D) 1000 units tablet Take 1,000 Units by mouth daily.     EPINEPHrine (EPIPEN 2-PAK) 0.3 mg/0.3 mL IJ SOAJ injection INJECT 0.3 MLS (0.3 MG TOTAL) INTO THE MUSCLE ONCE. 2 each 1   escitalopram (LEXAPRO) 20 MG tablet Take 20 mg by mouth daily.     esomeprazole (NEXIUM) 20 MG capsule Take 20 mg by mouth daily at 12 noon.     ibuprofen (ADVIL,MOTRIN) 200 MG tablet Take by mouth.     Red Yeast Rice Extract (RED YEAST RICE PO) Take by mouth.     No current facility-administered medications for this visit.    Family History  Problem Relation Age of Onset   Hyperlipidemia Mother    Heart disease Mother    Allergic rhinitis Mother    Eczema Mother    Diabetes Father     Hypertension Father    Heart attack Father        51s   Heart disease Father    Obesity Sister    Hypertension Sister    Hyperlipidemia Sister    Colon cancer Neg Hx    Stomach cancer Neg Hx    Rectal cancer Neg Hx    Pancreatic cancer Neg Hx    Esophageal cancer Neg Hx    Angioedema Neg Hx    Asthma Neg Hx    Atopy Neg Hx    Immunodeficiency Neg Hx    Urticaria Neg Hx     Review of Systems  Constitutional: Negative.   Gastrointestinal: Negative.   Genitourinary: Negative.    Exam:   BP (!) 144/81   Pulse 72   Ht 4' 11.75" (1.518 m)   Wt 155 lb (70.3 kg)   BMI 30.53 kg/m   Height: 4' 11.75" (151.8 cm)  General appearance: alert, cooperative and appears stated age Breasts: normal appearance, no masses or tenderness, implants present Abdomen: soft, non-tender; bowel sounds normal; no masses,  no organomegaly Lymph nodes: Cervical, supraclavicular, and axillary nodes normal.  No abnormal inguinal nodes palpated Neurologic: Grossly normal  Pelvic: External genitalia:  no lesions              Urethra:  normal appearing urethra with no masses, tenderness or lesions              Bartholins and Skenes: normal                 Vagina: normal appearing vagina with atrophic changes and no discharge, no lesions              Cervix: no lesions              Pap taken: No. Bimanual Exam:  Uterus:  normal size, contour, position, consistency, mobility, non-tender              Adnexa: normal adnexa and no mass, fullness, tenderness               Rectovaginal: Confirms               Anus:  normal sphincter tone, no lesions  Chaperone, Octaviano Batty, CMA, was present for exam.  Assessment/Plan: 1. Encntr for gyn exam (general) (routine) w/o abn findings - last pap smear 02/2017.  Reviewed guidelines.  Pt fine stopping pap smears. - MMG overdue.  Ordered and she will do today at [facility] location - colonoscopy 11/26/2018, hyperplastic polyp,  no follow up recommended - BMD 11/14.  Declines  repeating for now. - vaccines updated - lab work done with Dr. Birdie Riddle.  2. Encounter for screening mammogram for malignant neoplasm of breast - MM 3D SCREEN BREAST W/IMPLANT BILATERAL; Future  3. Elevated lipids - would prefer not to take red yeast rice or statin if possible - Ambulatory referral to Cardiology  4. Vaginal atrophy - declines any treatment  5. History of bilateral breast implants  6. Depression, unspecified depression type - under active care   Total time with pt: 36 minutes which is longer than typical appointment addressing concerns regarding medications and side effects, possibly new providers for GI/PCP/psychiatry care, and getting same day mmg scheduled

## 2021-03-28 NOTE — Patient Instructions (Signed)
Cassandra Reeve  Early, NP, Cassandra Little, [place], MD Cassandra Stalker, PA-C [facility]

## 2021-03-29 DIAGNOSIS — N952 Postmenopausal atrophic vaginitis: Secondary | ICD-10-CM | POA: Insufficient documentation

## 2021-03-29 DIAGNOSIS — Z9882 Breast implant status: Secondary | ICD-10-CM | POA: Insufficient documentation

## 2021-04-11 ENCOUNTER — Other Ambulatory Visit: Payer: Self-pay

## 2021-04-11 ENCOUNTER — Encounter: Payer: Self-pay | Admitting: Family Medicine

## 2021-04-11 ENCOUNTER — Ambulatory Visit (INDEPENDENT_AMBULATORY_CARE_PROVIDER_SITE_OTHER): Payer: Medicare Other | Admitting: Family Medicine

## 2021-04-11 VITALS — BP 112/75 | HR 71 | Temp 97.2°F | Resp 17 | Ht 59.7 in | Wt 157.0 lb

## 2021-04-11 DIAGNOSIS — Z01818 Encounter for other preprocedural examination: Secondary | ICD-10-CM

## 2021-04-11 NOTE — Patient Instructions (Signed)
DUE TO COVID-19 ONLY ONE VISITOR IS ALLOWED TO COME WITH YOU AND STAY IN THE WAITING ROOM ONLY DURING PRE OP AND PROCEDURE DAY OF SURGERY. THE 2 VISITORS  MAY VISIT WITH YOU AFTER SURGERY IN YOUR PRIVATE ROOM DURING VISITING HOURS ONLY!  YOU NEED TO HAVE A COVID 19 TEST ON_7/8______ @_______ , THIS TEST MUST BE DONE BEFORE SURGERY,  COVID TESTING SITE [address] [place] [address], IT IS ON THE RIGHT GOING OUT [address] APPROXIMATELY  2 MINUTES PAST [employer] ON THE RIGHT. ONCE YOUR COVID TEST IS COMPLETED,  PLEASE BEGIN THE QUARANTINE INSTRUCTIONS AS OUTLINED IN YOUR HANDOUT.                ELANAH OSMANOVIC     Your procedure is scheduled on: 04/25/21   Report to [facility] Main  Entrance   Report to short stay at 5:15 AM     Call this number if you have problems the morning of surgery [place], NO [place].   No food after midnight.    You may have clear liquid until 4:30 AM.    At 4:00 AM drink pre surgery drink.   Nothing by mouth after 4:30 AM.    Take these medicines the morning of surgery with A SIP OF WATER: Lexapro , Nexium                                 You may not have any metal on your body including hair pins and              piercings  Do not wear jewelry, make-up, lotions, powders or perfumes, deodorant             Do not wear nail polish on your fingernails.  Do not shave  48 hours prior to surgery.                Do not bring valuables to the hospital. [employer].  Contacts, dentures or bridgework may not be worn into surgery.        Special Instructions: N/A              Please read over the following fact sheets you were given: _____________________________________________________________________             [facility] - Preparing for Surgery Before surgery, you can play an  important role.  Because skin is not sterile, your skin needs to be as free of germs as possible.  You can reduce the number of germs on your skin by washing with CHG (chlorahexidine gluconate) soap before surgery.  CHG is an antiseptic cleaner which kills germs and bonds with the skin to continue killing germs even after washing. Please DO NOT use if you have an allergy to CHG or antibacterial soaps.  If your skin becomes reddened/irritated stop using the CHG and inform your nurse when you arrive at Short Stay. Do not shave (including legs and underarms) for at least 48 hours prior to the first CHG shower.   Please follow these instructions carefully:  1.  Shower with CHG Soap the night before surgery and the  morning of Surgery.  2.  If you  choose to wash your hair, wash your hair first as usual with your  normal  shampoo.  3.  After you shampoo, rinse your hair and body thoroughly to remove the  shampoo.                                        4.  Use CHG as you would any other liquid soap.  You can apply chg directly  to the skin and wash                       Gently with a scrungie or clean washcloth.  5.  Apply the CHG Soap to your body ONLY FROM THE NECK DOWN.   Do not use on face/ open                           Wound or open sores. Avoid contact with eyes, ears mouth and genitals (private parts).                       Wash face,  Genitals (private parts) with your normal soap.             6.  Wash thoroughly, paying special attention to the area where your surgery  will be performed.  7.  Thoroughly rinse your body with warm water from the neck down.  8.  DO NOT shower/wash with your normal soap after using and rinsing off  the CHG Soap.             9.  Pat yourself dry with a clean towel.            10.  Wear clean pajamas.            11.  Place clean sheets on your bed the night of your first shower and do not  sleep with pets. Day of Surgery : Do not apply any lotions/deodorants the  morning of surgery.  Please wear clean clothes to the hospital/surgery center.  FAILURE TO FOLLOW THESE INSTRUCTIONS MAY RESULT IN THE CANCELLATION OF YOUR SURGERY PATIENT SIGNATURE_________________________________  NURSE SIGNATURE__________________________________  ________________________________________________________________________   Adam Phenix  An incentive spirometer is a tool that can help keep your lungs clear and active. This tool measures how well you are filling your lungs with each breath. Taking long deep breaths may help reverse or decrease the chance of developing breathing (pulmonary) problems (especially infection) following: A long period of time when you are unable to move or be active. BEFORE THE PROCEDURE  If the spirometer includes an indicator to show your best effort, your nurse or respiratory therapist will set it to a desired goal. If possible, sit up straight or lean slightly forward. Try not to slouch. Hold the incentive spirometer in an upright position. INSTRUCTIONS FOR USE  Sit on the edge of your bed if possible, or sit up as far as you can in bed or on a chair. Hold the incentive spirometer in an upright position. Breathe out normally. Place the mouthpiece in your mouth and seal your lips tightly around it. Breathe in slowly and as deeply as possible, raising the piston or the ball toward the top of the column. Hold your breath for 3-5 seconds or for as long as possible. Allow the piston or ball to fall to  the bottom of the column. Remove the mouthpiece from your mouth and breathe out normally. Rest for a few seconds and repeat Steps 1 through 7 at least 10 times every 1-2 hours when you are awake. Take your time and take a few normal breaths between deep breaths. The spirometer may include an indicator to show your best effort. Use the indicator as a goal to work toward during each repetition. After each set of 10 deep breaths, practice  coughing to be sure your lungs are clear. If you have an incision (the cut made at the time of surgery), support your incision when coughing by placing a pillow or rolled up towels firmly against it. Once you are able to get out of bed, walk around indoors and cough well. You may stop using the incentive spirometer when instructed by your caregiver.  RISKS AND COMPLICATIONS Take your time so you do not get dizzy or light-headed. If you are in pain, you may need to take or ask for pain medication before doing incentive spirometry. It is harder to take a deep breath if you are having pain. AFTER USE Rest and breathe slowly and easily. It can be helpful to keep track of a log of your progress. Your caregiver can provide you with a simple table to help with this. If you are using the spirometer at home, follow these instructions: [place] IF:  You are having difficultly using the spirometer. You have trouble using the spirometer as often as instructed. Your pain medication is not giving enough relief while using the spirometer. You develop fever of 100.5 F (38.1 C) or higher. SEEK IMMEDIATE MEDICAL CARE IF:  You cough up bloody sputum that had not been present before. You develop fever of 102 F (38.9 C) or greater. You develop worsening pain at or near the incision site. MAKE SURE YOU:  Understand these instructions. Will watch your condition. Will get help right away if you are not doing well or get worse. Document Released: 02/11/2007 Document Revised: 12/24/2011 Document Reviewed: 04/14/2007 [facility] Patient Information 2014 [place], [place].   ________________________________________________________________________

## 2021-04-11 NOTE — Progress Notes (Signed)
Subjective:    Cassandra Little is a 75 y.o. female who presents to the office today for a preoperative consultation at the request of surgeon Alvan Dame who plans on performing L TKR on July 12. This consultation is requested for the specific conditions prompting preoperative evaluation (i.e. because of potential affect on operative risk): obesity, angioedema. Planned anesthesia: spinal. The patient has the following known anesthesia issues:  nausea . Patients bleeding risk: no recent abnormal bleeding, no remote history of abnormal bleeding, and no use of Ca-channel blockers. Patient does not have objections to receiving blood products if needed.  The following portions of the patient's history were reviewed and updated as appropriate: allergies, current medications, past family history, past medical history, past social history, past surgical history, and problem list.  Review of Systems A comprehensive review of systems was negative.    Objective:    BP 112/75   Pulse 71   Temp (!) 97.2 F (36.2 C) (Temporal)   Resp 17   Ht 4' 11.7" (1.516 m)   Wt 157 lb (71.2 kg)   SpO2 98%   BMI 30.97 kg/m   General Appearance:    Alert, cooperative, no distress, appears stated age  Head:    Normocephalic, without obvious abnormality, atraumatic  Eyes:    PERRL, conjunctiva/corneas clear, EOM's intact, both eyes  Ears:    Normal TM's and external ear canals, both ears  Nose:   Nares normal, septum midline, mucosa normal, no drainage    or sinus tenderness  Throat:   Lips, mucosa, and tongue normal; teeth and gums normal  Neck:   Supple, symmetrical, trachea midline, no adenopathy;    thyroid:  no enlargement/tenderness/nodules  Back:     Symmetric, no curvature, ROM normal, no CVA tenderness  Lungs:     Clear to auscultation bilaterally, respirations unlabored  Chest Wall:    No tenderness or deformity   Heart:    Regular rate and rhythm, S1 and S2 normal, no murmur, rub   or gallop  Breast Exam:       Abdomen:     Soft, non-tender, bowel sounds active all four quadrants,    no masses, no organomegaly  Genitalia:    Rectal:    Extremities:   Extremities normal, atraumatic, no cyanosis or edema  Pulses:   2+ and symmetric all extremities  Skin:   Skin color, texture, turgor normal, no rashes or lesions  Lymph nodes:   Cervical, supraclavicular, and axillary nodes normal  Neurologic:   CNII-XII intact, normal strength, sensation and reflexes    throughout    Predictors of intubation difficulty:  Morbid obesity? no  Anatomically abnormal facies? no  Prominent incisors? no  Receding mandible? no  Short, thick neck? no  Neck range of motion: normal  Dentition: No chipped, loose, or missing teeth.  Cardiographics ECG: normal sinus rhythm, no blocks or conduction defects, no ischemic changes Echocardiogram: not done  Imaging Chest x-ray:  NA    Lab Review  Office Visit on 12/27/2020  Component Date Value   Cholesterol 12/27/2020 225 (A)   Triglycerides 12/27/2020 170.0 (A)   HDL 12/27/2020 56.00    VLDL 12/27/2020 34.0    LDL Cholesterol 12/27/2020 135 (A)   Total CHOL/HDL Ratio 12/27/2020 4    NonHDL 12/27/2020 168.67    Sodium 12/27/2020 138    Potassium 12/27/2020 4.3    Chloride 12/27/2020 106    CO2 12/27/2020 27    Glucose, Bld 12/27/2020 86  BUN 12/27/2020 27 (A)   Creatinine, Ser 12/27/2020 0.94    GFR 12/27/2020 60.59    Calcium 12/27/2020 9.3    TSH 12/27/2020 1.81    Total Bilirubin 12/27/2020 0.3    Bilirubin, Direct 12/27/2020 0.0    Alkaline Phosphatase 12/27/2020 72    AST 12/27/2020 18    ALT 12/27/2020 21    Total Protein 12/27/2020 7.0    Albumin 12/27/2020 4.0    WBC 12/27/2020 5.9    RBC 12/27/2020 4.67    Hemoglobin 12/27/2020 13.5    HCT 12/27/2020 40.6    MCV 12/27/2020 86.9    MCHC 12/27/2020 33.3    RDW 12/27/2020 14.5    Platelets 12/27/2020 245.0    Neutrophils Relative % 12/27/2020 52.8    Lymphocytes Relative 12/27/2020 36.1     Monocytes Relative 12/27/2020 8.6    Eosinophils Relative 12/27/2020 1.9    Basophils Relative 12/27/2020 0.6    Neutro Abs 12/27/2020 3.1    Lymphs Abs 12/27/2020 2.1    Monocytes Absolute 12/27/2020 0.5    Eosinophils Absolute 12/27/2020 0.1    Basophils Absolute 12/27/2020 0.0    VITD 12/27/2020 76.54    Iron 12/27/2020 120    TIBC 12/27/2020 370    %SAT 12/27/2020 32    Ferritin 12/27/2020 26       Assessment:      75 y.o. female with planned surgery as above.   Known risk factors for perioperative complications: None   Difficulty with intubation is not anticipated.  Cardiac Risk Estimation: low  Current medications which may produce withdrawal symptoms if withheld perioperatively: none    Plan:    1. Preoperative workup as follows ECG. 2. Change in medication regimen before surgery: discontinue ASA 14 days before surgery and discontinue NSAIDs (ibuprofen/naproxen) 14 days before surgery. 3. Prophylaxis for cardiac events with perioperative beta-blockers: should be considered, specific regimen per anesthesia. 4. Invasive hemodynamic monitoring perioperatively: at the discretion of anesthesiologist. 5. Deep vein thrombosis prophylaxis postoperatively:regimen to be chosen by surgical team. 6. Surveillance for postoperative MI with ECG immediately postoperatively and on postoperative days 1 and 2 AND troponin levels 24 hours postoperatively and on day 4 or hospital discharge (whichever comes first): at the discretion of anesthesiologist. 7. Other measures:  none

## 2021-04-11 NOTE — Patient Instructions (Signed)
Follow up as needed or as scheduled Things look great for surgery! Make sure you stop all aspirin and anti-inflammatory medications starting now Call with any questions or concerns GOOD LUCK!!

## 2021-04-12 ENCOUNTER — Encounter (HOSPITAL_COMMUNITY): Payer: Self-pay

## 2021-04-12 ENCOUNTER — Encounter: Payer: Self-pay | Admitting: *Deleted

## 2021-04-12 ENCOUNTER — Other Ambulatory Visit: Payer: Self-pay

## 2021-04-12 ENCOUNTER — Encounter (HOSPITAL_COMMUNITY)
Admission: RE | Admit: 2021-04-12 | Discharge: 2021-04-12 | Disposition: A | Discharge status: Home / Self Care | Payer: Medicare Other | Source: Ambulatory Visit | Attending: Orthopedic Surgery | Admitting: Orthopedic Surgery

## 2021-04-12 DIAGNOSIS — Z01812 Encounter for preprocedural laboratory examination: Secondary | ICD-10-CM | POA: Diagnosis not present

## 2021-04-12 LAB — CBC
HCT: 41.8 % (ref 36.0–46.0)
Hemoglobin: 13.6 g/dL (ref 12.0–15.0)
MCH: 28.8 pg (ref 26.0–34.0)
MCHC: 32.5 g/dL (ref 30.0–36.0)
MCV: 88.6 fL (ref 80.0–100.0)
Platelets: 226 10*3/uL (ref 150–400)
RBC: 4.72 MIL/uL (ref 3.87–5.11)
RDW: 13.9 % (ref 11.5–15.5)
WBC: 5 10*3/uL (ref 4.0–10.5)
nRBC: 0 % (ref 0.0–0.2)

## 2021-04-12 LAB — PROTIME-INR
INR: 1 (ref 0.8–1.2)
Prothrombin Time: 13.3 seconds (ref 11.4–15.2)

## 2021-04-12 LAB — COMPREHENSIVE METABOLIC PANEL
ALT: 19 U/L (ref 0–44)
AST: 18 U/L (ref 15–41)
Albumin: 4.2 g/dL (ref 3.5–5.0)
Alkaline Phosphatase: 56 U/L (ref 38–126)
Anion gap: 6 (ref 5–15)
BUN: 23 mg/dL (ref 8–23)
CO2: 27 mmol/L (ref 22–32)
Calcium: 9.3 mg/dL (ref 8.9–10.3)
Chloride: 107 mmol/L (ref 98–111)
Creatinine, Ser: 0.76 mg/dL (ref 0.44–1.00)
GFR, Estimated: >60 mL/min (ref >60)
Glucose, Bld: 100 mg/dL — ABNORMAL HIGH (ref 70–99)
Potassium: 4.1 mmol/L (ref 3.5–5.1)
Sodium: 140 mmol/L (ref 135–145)
Total Bilirubin: 0.5 mg/dL (ref 0.3–1.2)
Total Protein: 7.2 g/dL (ref 6.5–8.1)

## 2021-04-12 LAB — APTT: aPTT: 29 seconds (ref 24–36)

## 2021-04-12 LAB — TYPE AND SCREEN
ABO/RH(D): O NEG
Antibody Screen: NEGATIVE

## 2021-04-12 LAB — SURGICAL PCR SCREEN
MRSA, PCR: NEGATIVE
Staphylococcus aureus: NEGATIVE

## 2021-04-12 NOTE — Progress Notes (Addendum)
COVID Vaccine Completed:yes Date COVID Vaccine completed:12/17/19-boosters 08/09/20, 02/23/21 COVID vaccine manufacturer: Moderna    PCP - Dr. Raliegh Ip. Tabori LOV 04/11/21 Cardiologist - none  Chest x-ray - no EKG - 04/11/21-on chart Stress Test - no ECHO - no Cardiac Cath - no Pacemaker/ICD device last checked:NA  Sleep Study - yes- years ago a C-Pap was ordered. She was unable to tolerate the mask CPAP - no. (Score was a 3 now)  Fasting Blood Sugar - NA Checks Blood Sugar _____ times a day  Blood Thinner Instructions:NA Aspirin Instructions: Last Dose:  Anesthesia review: no  Patient denies shortness of breath, fever, cough and chest pain at PAT appointment Yes. Pt is active and has no SOB with any activities.  Patient verbalized understanding of instructions that were given to them at the PAT appointment. Patient was also instructed that they will need to review over the PAT instructions again at home before surgery. Yes  Pt had Covid + home test 03/10/21 and treated by Dr. Clemencia Course.

## 2021-04-18 NOTE — H&P (Signed)
TOTAL KNEE ADMISSION H&P  Patient is being admitted for left total knee arthroplasty.  Subjective:  Chief Complaint:left knee pain.  HPI: Cassandra Little, 75 y.o. female, has a history of pain and functional disability in the left knee due to arthritis and has failed non-surgical conservative treatments for greater than 12 weeks to includeNSAID's and/or analgesics and activity modification.  Onset of symptoms was gradual, starting 2 years ago with gradually worsening course since that time. The patient noted no past surgery on the left knee(s).  Patient currently rates pain in the left knee(s) at 7 out of 10 with activity. Patient has worsening of pain with activity and weight bearing and pain that interferes with activities of daily living.  Patient has evidence of joint space narrowing by imaging studies.  There is no active infection.  Patient Active Problem List   Diagnosis Date Noted   History of bilateral breast implants 03/29/2021   Vaginal atrophy 03/29/2021   Obesity (BMI 30-39.9) 12/27/2020   Angio-edema 11/27/2018   Allergic reaction 11/27/2018   Other allergic rhinitis 11/27/2018   History of iron deficiency anemia 09/23/2018   Laryngopharyngeal reflux (LPR) 09/23/2018   Vitamin D deficiency 02/20/2017   Right foot pain 02/02/2015   Stress incontinence 01/05/2015   Arthritis 01/05/2015   Diarrhea, ? IBS, could be microscopic colitis 09/25/2013   Migraine 04/23/2012   Hemorrhoids 11/09/2011   General medical examination 09/17/2011   Neoplasm of uncertain behavior of skin 08/27/2011   Anaphylactic syndrome 02/14/2011   GERD 09/18/2010   Hyperlipidemia 08/20/2008   Depression 08/20/2008   Past Medical History:  Diagnosis Date   Anaphylaxis 02/14/2011   Unknown cause.  Always carries Epipen.   Angioedema    Anxiety    Arthritis    Cough    Depression    Dr. Abner Greenspan   Exposure to hepatitis C    GERD (gastroesophageal reflux disease)    Hyperlipidemia     Hypertension    IBS (irritable bowel syndrome)    Internal and external bleeding hemorrhoids    Migraine    PONV (postoperative nausea and vomiting)    Rosacea     Past Surgical History:  Procedure Laterality Date   ABDOMINOPLASTY  2000   APPENDECTOMY  2004   AUGMENTATION MAMMAPLASTY Bilateral 2011   [place]   x 2   COLONOSCOPY  09/15/2008   internal and external hemorroids   COSMETIC SURGERY     multiple   HEMORRHOID BANDING  2014   REDUCTION MAMMAPLASTY Bilateral 2002   TONSILLECTOMY  1962   TOTAL KNEE ARTHROPLASTY Right 12/26/2015   Procedure: TOTAL KNEE ARTHROPLASTY;  Surgeon: Paralee Cancel, MD;  Location: [facility];  Service: Orthopedics;  Laterality: Right;   TOTAL SHOULDER REPLACEMENT Bilateral 2009   right   TOTAL SHOULDER REPLACEMENT     UPPER GASTROINTESTINAL ENDOSCOPY  11/26/2018    No current facility-administered medications for this encounter.   Current Outpatient Medications  Medication Sig Dispense Refill Last Dose   b complex vitamins tablet Take 1 tablet by mouth daily.      cetirizine (ZYRTEC) 10 MG tablet Take 10 mg by mouth daily as needed for allergies.      cholecalciferol (VITAMIN D) 1000 units tablet Take 1,000 Units by mouth daily.      COLLAGEN PO Take 1 Scoop by mouth daily.      eletriptan (RELPAX) 20 MG tablet Take 20 mg by mouth as needed for migraine or headache. May  repeat in 2 hours if headache persists or recurs.      EPINEPHrine (EPIPEN 2-PAK) 0.3 mg/0.3 mL IJ SOAJ injection INJECT 0.3 MLS (0.3 MG TOTAL) INTO THE MUSCLE ONCE. (Patient taking differently: Inject 0.3 mg into the muscle as needed for anaphylaxis.) 2 each 1    escitalopram (LEXAPRO) 20 MG tablet Take 20 mg by mouth daily.      esomeprazole (NEXIUM) 20 MG capsule Take 20 mg by mouth daily.      hydrocortisone (ANUSOL-HC) 2.5 % rectal cream Place 1 application rectally 2 (two) times daily as needed for hemorrhoids or anal itching.      ibuprofen (ADVIL,MOTRIN) 200  MG tablet Take 400 mg by mouth every 6 (six) hours as needed for mild pain.      loperamide (IMODIUM A-D) 2 MG tablet Take 4 mg by mouth 4 (four) times daily as needed for diarrhea or loose stools.      naproxen sodium (ALEVE) 220 MG tablet Take 440 mg by mouth 2 (two) times daily as needed (pain).      Red Yeast Rice Extract (RED YEAST RICE PO) Take 3 capsules by mouth daily.      Allergies  Allergen Reactions   Ace Inhibitors Cough    Social History   Tobacco Use   Smoking status: Never   Smokeless tobacco: Never  Substance Use Topics   Alcohol use: Yes    Alcohol/week: 1.0 standard drink    Types: 1 Glasses of wine per week    Family History  Problem Relation Age of Onset   Hyperlipidemia Mother    Heart disease Mother    Allergic rhinitis Mother    Eczema Mother    Diabetes Father    Hypertension Father    Heart attack Father        26s   Heart disease Father    Obesity Sister    Hypertension Sister    Hyperlipidemia Sister    Colon cancer Neg Hx    Stomach cancer Neg Hx    Rectal cancer Neg Hx    Pancreatic cancer Neg Hx    Esophageal cancer Neg Hx    Angioedema Neg Hx    Asthma Neg Hx    Atopy Neg Hx    Immunodeficiency Neg Hx    Urticaria Neg Hx      Review of Systems  Constitutional:  Negative for chills and fever.  Respiratory:  Negative for cough and shortness of breath.   Cardiovascular:  Negative for chest pain.  Gastrointestinal:  Negative for nausea and vomiting.  Musculoskeletal:  Positive for arthralgias.   Objective:  Physical Exam Well nourished and well developed. General: Alert and oriented x3, cooperative and pleasant, no acute distress. Head: normocephalic, atraumatic, neck supple. Eyes: EOMI.  Musculoskeletal: Left knee exam: No palpable effusion, warmth or erythema Neutral slight valgus left knee without flexion contracture. Passively correctable valgus Flexion to 120 degrees with some tightness and crepitation  anteriorly  Calves soft and nontender. Motor function intact in LE. Strength 5/5 LE bilaterally. Neuro: Distal pulses 2+. Sensation to light touch intact in LE. Vital signs in last 24 hours:    Labs:   Estimated body mass index is 30.9 kg/m as calculated from the following:   Height as of 04/12/21: 4\' 11"  (1.499 m).   Weight as of 04/12/21: 69.4 kg.   Imaging Review Plain radiographs demonstrate severe degenerative joint disease of the left knee(s). The overall alignment isneutral. The bone quality appears to  be adequate for age and reported activity level.      Assessment/Plan:  End stage arthritis, left knee   The patient history, physical examination, clinical judgment of the provider and imaging studies are consistent with end stage degenerative joint disease of the left knee(s) and total knee arthroplasty is deemed medically necessary. The treatment options including medical management, injection therapy arthroscopy and arthroplasty were discussed at length. The risks and benefits of total knee arthroplasty were presented and reviewed. The risks due to aseptic loosening, infection, stiffness, patella tracking problems, thromboembolic complications and other imponderables were discussed. The patient acknowledged the explanation, agreed to proceed with the plan and consent was signed. Patient is being admitted for inpatient treatment for surgery, pain control, PT, OT, prophylactic antibiotics, VTE prophylaxis, progressive ambulation and ADL's and discharge planning. The patient is planning to be discharged  home.   Therapy Plans: outpatient therapy at [place] [place] Disposition: Home with friend Planned DVT Prophylaxis: aspirin 81mg  BID DME needed: none PCP: Dr. Annye Asa, clearance received TXA: IV Allergies: ACEi - cough Anesthesia Concerns: nausea/vomiting BMI: 30.6 Last HgbA1c: Not diabetic.  Other: - tylenol, celebrex, oxycodone, robaxin - Hx of anaphylactic shock  for unknown reason - may go years without an episode - Hx of COVID infection on 5/27, treated   Patient's anticipated LOS is less than 2 midnights, meeting these requirements: - Younger than 33 - Lives within 1 hour of care - Has a competent adult at home to recover with post-op recover - NO history of  - Chronic pain requiring opiods  - Diabetes  - Coronary Artery Disease  - Heart failure  - Heart attack  - Stroke  - DVT/VTE  - Cardiac arrhythmia  - Respiratory Failure/COPD  - Renal failure  - Anemia  - Advanced Liver disease  Griffith Citron, PA-C Orthopedic Surgery [facility] [place] [phone]

## 2021-04-24 NOTE — Anesthesia Preprocedure Evaluation (Addendum)
Anesthesia Evaluation  Patient identified by MRN, date of birth, ID band Patient awake    Reviewed: Allergy & Precautions, NPO status , Patient's Chart, lab work & pertinent test results  History of Anesthesia Complications (+) PONV and history of anesthetic complications  Airway Mallampati: II  TM Distance: >3 FB Neck ROM: Full    Dental  (+) Dental Advisory Given   Pulmonary neg pulmonary ROS,    Pulmonary exam normal        Cardiovascular hypertension, Normal cardiovascular exam     Neuro/Psych  Headaches, PSYCHIATRIC DISORDERS Anxiety Depression    GI/Hepatic Neg liver ROS, GERD  Medicated and Controlled, IBS    Endo/Other   Obesity   Renal/GU negative Renal ROS     Musculoskeletal  (+) Arthritis ,   Abdominal   Peds  Hematology negative hematology ROS (+)   Anesthesia Other Findings   Reproductive/Obstetrics                            Anesthesia Physical Anesthesia Plan  ASA: 2  Anesthesia Plan: Spinal   Post-op Pain Management:  Regional for Post-op pain   Induction:   PONV Risk Score and Plan: 3 and Treatment may vary due to age or medical condition and Propofol infusion  Airway Management Planned: Natural Airway and Simple Face Mask  Additional Equipment: None  Intra-op Plan:   Post-operative Plan:   Informed Consent: I have reviewed the patients History and Physical, chart, labs and discussed the procedure including the risks, benefits and alternatives for the proposed anesthesia with the patient or authorized representative who has indicated his/her understanding and acceptance.       Plan Discussed with: CRNA and Anesthesiologist  Anesthesia Plan Comments: (Labs reviewed, platelets acceptable. Discussed risks and benefits of spinal, including spinal/epidural hematoma, infection, failed block, and PDPH. Patient expressed understanding and wished to proceed.  )       Anesthesia Quick Evaluation

## 2021-04-25 ENCOUNTER — Observation Stay (HOSPITAL_COMMUNITY)
Admission: RE | Admit: 2021-04-25 | Discharge: 2021-04-26 | Disposition: A | Discharge status: Home / Self Care | Payer: Medicare Other | Attending: Orthopedic Surgery | Admitting: Orthopedic Surgery

## 2021-04-25 ENCOUNTER — Ambulatory Visit (HOSPITAL_COMMUNITY): Payer: Medicare Other | Admitting: Certified Registered Nurse Anesthetist

## 2021-04-25 ENCOUNTER — Encounter (HOSPITAL_COMMUNITY)
Admission: RE | Disposition: A | Discharge status: Home / Self Care | Payer: Self-pay | Source: Home / Self Care | Attending: Orthopedic Surgery

## 2021-04-25 ENCOUNTER — Other Ambulatory Visit: Payer: Self-pay

## 2021-04-25 ENCOUNTER — Encounter (HOSPITAL_COMMUNITY): Payer: Self-pay | Admitting: Orthopedic Surgery

## 2021-04-25 DIAGNOSIS — Z85828 Personal history of other malignant neoplasm of skin: Secondary | ICD-10-CM | POA: Insufficient documentation

## 2021-04-25 DIAGNOSIS — M1712 Unilateral primary osteoarthritis, left knee: Secondary | ICD-10-CM | POA: Diagnosis not present

## 2021-04-25 DIAGNOSIS — I10 Essential (primary) hypertension: Secondary | ICD-10-CM | POA: Diagnosis not present

## 2021-04-25 DIAGNOSIS — Z96611 Presence of right artificial shoulder joint: Secondary | ICD-10-CM | POA: Diagnosis not present

## 2021-04-25 DIAGNOSIS — E559 Vitamin D deficiency, unspecified: Secondary | ICD-10-CM | POA: Diagnosis not present

## 2021-04-25 DIAGNOSIS — Z79899 Other long term (current) drug therapy: Secondary | ICD-10-CM | POA: Diagnosis not present

## 2021-04-25 DIAGNOSIS — Z96612 Presence of left artificial shoulder joint: Secondary | ICD-10-CM | POA: Diagnosis not present

## 2021-04-25 DIAGNOSIS — G8918 Other acute postprocedural pain: Secondary | ICD-10-CM | POA: Diagnosis not present

## 2021-04-25 DIAGNOSIS — Z96651 Presence of right artificial knee joint: Secondary | ICD-10-CM | POA: Diagnosis not present

## 2021-04-25 DIAGNOSIS — E785 Hyperlipidemia, unspecified: Secondary | ICD-10-CM | POA: Diagnosis not present

## 2021-04-25 DIAGNOSIS — Z96652 Presence of left artificial knee joint: Secondary | ICD-10-CM

## 2021-04-25 HISTORY — PX: TOTAL KNEE ARTHROPLASTY: SHX125

## 2021-04-25 SURGERY — ARTHROPLASTY, KNEE, TOTAL
Anesthesia: Spinal | Site: Knee | Laterality: Left

## 2021-04-25 MED ORDER — ROPIVACAINE HCL 7.5 MG/ML IJ SOLN
INTRAMUSCULAR | Status: DC | PRN
  Administered 2021-04-25: 20 mL via PERINEURAL

## 2021-04-25 MED ORDER — ONDANSETRON HCL 4 MG PO TABS: 4.0000 mg | ORAL_TABLET | Freq: Four times a day (QID) | ORAL | Status: DC | PRN

## 2021-04-25 MED ORDER — KETOROLAC TROMETHAMINE 30 MG/ML IJ SOLN
INTRAMUSCULAR | Status: AC
  Filled 2021-04-25: qty 1

## 2021-04-25 MED ORDER — POVIDONE-IODINE 10 % EX SWAB
2.0000 | Freq: Once | CUTANEOUS | Status: AC
Start: 2021-04-25 — End: 2021-04-25
  Administered 2021-04-25: 2 via TOPICAL

## 2021-04-25 MED ORDER — KETOROLAC TROMETHAMINE 30 MG/ML IJ SOLN
INTRAMUSCULAR | Status: DC | PRN
  Administered 2021-04-25: 30 mg

## 2021-04-25 MED ORDER — ONDANSETRON HCL 4 MG/2ML IJ SOLN
INTRAMUSCULAR | Status: DC | PRN
  Administered 2021-04-25: 4 mg via INTRAVENOUS

## 2021-04-25 MED ORDER — CELECOXIB 200 MG PO CAPS
200.0000 mg | ORAL_CAPSULE | Freq: Two times a day (BID) | ORAL | Status: DC
  Administered 2021-04-25 – 2021-04-26 (×2): 200 mg via ORAL
  Filled 2021-04-25 (×2): qty 1

## 2021-04-25 MED ORDER — PHENYLEPHRINE HCL (PRESSORS) 10 MG/ML IV SOLN
INTRAVENOUS | Status: AC
  Filled 2021-04-25: qty 1

## 2021-04-25 MED ORDER — PROPOFOL 500 MG/50ML IV EMUL
INTRAVENOUS | Status: DC | PRN
  Administered 2021-04-25: 75 ug/kg/min via INTRAVENOUS

## 2021-04-25 MED ORDER — OXYCODONE HCL 5 MG/5ML PO SOLN: 5.0000 mg | Freq: Once | ORAL | Status: DC | PRN

## 2021-04-25 MED ORDER — METHOCARBAMOL 500 MG IVPB - SIMPLE MED
500.0000 mg | Freq: Four times a day (QID) | INTRAVENOUS | Status: DC | PRN
  Filled 2021-04-25: qty 50

## 2021-04-25 MED ORDER — PHENOL 1.4 % MT LIQD: 1.0000 | OROMUCOSAL | Status: DC | PRN

## 2021-04-25 MED ORDER — TRANEXAMIC ACID-NACL 1000-0.7 MG/100ML-% IV SOLN
1000.0000 mg | INTRAVENOUS | Status: AC
  Administered 2021-04-25: 1000 mg via INTRAVENOUS
  Filled 2021-04-25: qty 100

## 2021-04-25 MED ORDER — ONDANSETRON HCL 4 MG/2ML IJ SOLN
INTRAMUSCULAR | Status: AC
  Filled 2021-04-25: qty 2

## 2021-04-25 MED ORDER — DEXAMETHASONE SODIUM PHOSPHATE 10 MG/ML IJ SOLN
INTRAMUSCULAR | Status: AC
  Filled 2021-04-25: qty 1

## 2021-04-25 MED ORDER — LOPERAMIDE HCL 2 MG PO CAPS: 4.0000 mg | ORAL_CAPSULE | Freq: Four times a day (QID) | ORAL | Status: DC | PRN

## 2021-04-25 MED ORDER — ESCITALOPRAM OXALATE 20 MG PO TABS
20.0000 mg | ORAL_TABLET | Freq: Every day | ORAL | Status: DC
  Administered 2021-04-26: 20 mg via ORAL
  Filled 2021-04-25 (×2): qty 1

## 2021-04-25 MED ORDER — ELETRIPTAN HYDROBROMIDE 20 MG PO TABS
20.0000 mg | ORAL_TABLET | ORAL | Status: DC | PRN
  Filled 2021-04-25: qty 1

## 2021-04-25 MED ORDER — ONDANSETRON HCL 4 MG/2ML IJ SOLN: 4.0000 mg | Freq: Once | INTRAMUSCULAR | Status: DC | PRN

## 2021-04-25 MED ORDER — BUPIVACAINE IN DEXTROSE 0.75-8.25 % IT SOLN
INTRATHECAL | Status: DC | PRN
  Administered 2021-04-25: 1.4 mL via INTRATHECAL

## 2021-04-25 MED ORDER — DIPHENHYDRAMINE HCL 12.5 MG/5ML PO ELIX
12.5000 mg | ORAL_SOLUTION | ORAL | Status: DC | PRN
Start: 2021-04-25 — End: 2021-04-26

## 2021-04-25 MED ORDER — HYDROCORTISONE (PERIANAL) 2.5 % EX CREA: 1.0000 "application " | TOPICAL_CREAM | Freq: Two times a day (BID) | CUTANEOUS | Status: DC | PRN

## 2021-04-25 MED ORDER — SODIUM CHLORIDE 0.9 % IV SOLN: INTRAVENOUS | Status: DC

## 2021-04-25 MED ORDER — ONDANSETRON HCL 4 MG/2ML IJ SOLN
4.0000 mg | Freq: Four times a day (QID) | INTRAMUSCULAR | Status: DC | PRN
  Administered 2021-04-26: 4 mg via INTRAVENOUS
  Filled 2021-04-25: qty 2

## 2021-04-25 MED ORDER — DOCUSATE SODIUM 100 MG PO CAPS
100.0000 mg | ORAL_CAPSULE | Freq: Two times a day (BID) | ORAL | Status: DC
  Administered 2021-04-25 – 2021-04-26 (×3): 100 mg via ORAL
  Filled 2021-04-25 (×3): qty 1

## 2021-04-25 MED ORDER — BUPIVACAINE-EPINEPHRINE (PF) 0.25% -1:200000 IJ SOLN
INTRAMUSCULAR | Status: AC
  Filled 2021-04-25: qty 30

## 2021-04-25 MED ORDER — POLYETHYLENE GLYCOL 3350 17 G PO PACK: 17.0000 g | PACK | Freq: Every day | ORAL | Status: DC | PRN

## 2021-04-25 MED ORDER — DEXAMETHASONE SODIUM PHOSPHATE 10 MG/ML IJ SOLN
10.0000 mg | Freq: Once | INTRAMUSCULAR | Status: AC
  Administered 2021-04-26: 10 mg via INTRAVENOUS
  Filled 2021-04-25 (×2): qty 1

## 2021-04-25 MED ORDER — FENTANYL CITRATE (PF) 100 MCG/2ML IJ SOLN: 25.0000 ug | INTRAMUSCULAR | Status: DC | PRN

## 2021-04-25 MED ORDER — OXYCODONE HCL 5 MG PO TABS
5.0000 mg | ORAL_TABLET | Freq: Once | ORAL | Status: DC | PRN
Start: 2021-04-25 — End: 2021-04-25

## 2021-04-25 MED ORDER — OXYCODONE HCL 5 MG PO TABS
10.0000 mg | ORAL_TABLET | ORAL | Status: DC | PRN
  Administered 2021-04-26: 15 mg via ORAL
  Filled 2021-04-25: qty 3

## 2021-04-25 MED ORDER — ACETAMINOPHEN 325 MG PO TABS: 325.0000 mg | ORAL_TABLET | Freq: Four times a day (QID) | ORAL | Status: DC | PRN

## 2021-04-25 MED ORDER — SODIUM CHLORIDE (PF) 0.9 % IJ SOLN
INTRAMUSCULAR | Status: AC
  Filled 2021-04-25: qty 30

## 2021-04-25 MED ORDER — BUPIVACAINE-EPINEPHRINE (PF) 0.25% -1:200000 IJ SOLN
INTRAMUSCULAR | Status: DC | PRN
  Administered 2021-04-25: 30 mL

## 2021-04-25 MED ORDER — LACTATED RINGERS IV SOLN: INTRAVENOUS | Status: DC

## 2021-04-25 MED ORDER — STERILE WATER FOR IRRIGATION IR SOLN
Status: DC | PRN
  Administered 2021-04-25: 2000 mL

## 2021-04-25 MED ORDER — BISACODYL 10 MG RE SUPP: 10.0000 mg | Freq: Every day | RECTAL | Status: DC | PRN

## 2021-04-25 MED ORDER — DEXAMETHASONE SODIUM PHOSPHATE 10 MG/ML IJ SOLN
8.0000 mg | Freq: Once | INTRAMUSCULAR | Status: AC
Start: 2021-04-25 — End: 2021-04-25
  Administered 2021-04-25: 8 mg via INTRAVENOUS

## 2021-04-25 MED ORDER — METOCLOPRAMIDE HCL 5 MG PO TABS
5.0000 mg | ORAL_TABLET | Freq: Three times a day (TID) | ORAL | Status: DC | PRN
Start: 2021-04-25 — End: 2021-04-26

## 2021-04-25 MED ORDER — SODIUM CHLORIDE 0.9 % IV SOLN
2.0000 g | Freq: Four times a day (QID) | INTRAVENOUS | Status: AC
  Administered 2021-04-25 (×2): 2 g via INTRAVENOUS
  Filled 2021-04-25 (×2): qty 2

## 2021-04-25 MED ORDER — METHOCARBAMOL 500 MG PO TABS
500.0000 mg | ORAL_TABLET | Freq: Four times a day (QID) | ORAL | Status: DC | PRN
  Administered 2021-04-25 – 2021-04-26 (×2): 500 mg via ORAL
  Filled 2021-04-25 (×2): qty 1

## 2021-04-25 MED ORDER — FENTANYL CITRATE (PF) 100 MCG/2ML IJ SOLN
INTRAMUSCULAR | Status: AC
  Filled 2021-04-25: qty 2

## 2021-04-25 MED ORDER — PROPOFOL 1000 MG/100ML IV EMUL
INTRAVENOUS | Status: AC
  Filled 2021-04-25: qty 100

## 2021-04-25 MED ORDER — HYDROMORPHONE HCL 1 MG/ML IJ SOLN: 0.5000 mg | INTRAMUSCULAR | Status: DC | PRN

## 2021-04-25 MED ORDER — EPINEPHRINE 0.3 MG/0.3ML IJ SOAJ
0.3000 mg | INTRAMUSCULAR | Status: DC | PRN
  Filled 2021-04-25: qty 0.6

## 2021-04-25 MED ORDER — LIDOCAINE 2% (20 MG/ML) 5 ML SYRINGE
INTRAMUSCULAR | Status: AC
  Filled 2021-04-25: qty 5

## 2021-04-25 MED ORDER — FENTANYL CITRATE (PF) 100 MCG/2ML IJ SOLN
INTRAMUSCULAR | Status: DC | PRN
  Administered 2021-04-25 (×2): 50 ug via INTRAVENOUS

## 2021-04-25 MED ORDER — OXYCODONE HCL 5 MG PO TABS
5.0000 mg | ORAL_TABLET | ORAL | Status: DC | PRN
Start: 2021-04-25 — End: 2021-04-26
  Administered 2021-04-25 (×3): 10 mg via ORAL
  Administered 2021-04-26 (×2): 5 mg via ORAL
  Filled 2021-04-25: qty 1
  Filled 2021-04-25 (×4): qty 2

## 2021-04-25 MED ORDER — 0.9 % SODIUM CHLORIDE (POUR BTL) OPTIME
TOPICAL | Status: DC | PRN
  Administered 2021-04-25: 1000 mL

## 2021-04-25 MED ORDER — CHLORHEXIDINE GLUCONATE 0.12 % MT SOLN
15.0000 mL | Freq: Once | OROMUCOSAL | Status: AC
Start: 2021-04-25 — End: 2021-04-25
  Administered 2021-04-25: 15 mL via OROMUCOSAL

## 2021-04-25 MED ORDER — LORATADINE 10 MG PO TABS
10.0000 mg | ORAL_TABLET | Freq: Every day | ORAL | Status: DC
  Filled 2021-04-25 (×2): qty 1

## 2021-04-25 MED ORDER — ORAL CARE MOUTH RINSE: 15.0000 mL | Freq: Once | OROMUCOSAL | Status: AC

## 2021-04-25 MED ORDER — TRANEXAMIC ACID-NACL 1000-0.7 MG/100ML-% IV SOLN
1000.0000 mg | Freq: Once | INTRAVENOUS | Status: AC
  Administered 2021-04-25: 1000 mg via INTRAVENOUS
  Filled 2021-04-25: qty 100

## 2021-04-25 MED ORDER — MENTHOL 3 MG MT LOZG: 1.0000 | LOZENGE | OROMUCOSAL | Status: DC | PRN

## 2021-04-25 MED ORDER — ASPIRIN 81 MG PO CHEW
81.0000 mg | CHEWABLE_TABLET | Freq: Two times a day (BID) | ORAL | Status: DC
  Administered 2021-04-25 – 2021-04-26 (×2): 81 mg via ORAL
  Filled 2021-04-25 (×2): qty 1

## 2021-04-25 MED ORDER — PANTOPRAZOLE SODIUM 40 MG PO TBEC
40.0000 mg | DELAYED_RELEASE_TABLET | Freq: Every day | ORAL | Status: DC
  Administered 2021-04-26: 40 mg via ORAL
  Filled 2021-04-25 (×2): qty 1

## 2021-04-25 MED ORDER — METOCLOPRAMIDE HCL 5 MG/ML IJ SOLN: 5.0000 mg | Freq: Three times a day (TID) | INTRAMUSCULAR | Status: DC | PRN

## 2021-04-25 MED ORDER — PHENYLEPHRINE HCL-NACL 10-0.9 MG/250ML-% IV SOLN
INTRAVENOUS | Status: DC | PRN
  Administered 2021-04-25: 40 ug/min via INTRAVENOUS

## 2021-04-25 MED ORDER — SODIUM CHLORIDE 0.9 % IV SOLN
2.0000 g | INTRAVENOUS | Status: AC
  Administered 2021-04-25: 2 g via INTRAVENOUS
  Filled 2021-04-25: qty 2

## 2021-04-25 MED ORDER — SODIUM CHLORIDE (PF) 0.9 % IJ SOLN
INTRAMUSCULAR | Status: DC | PRN
  Administered 2021-04-25: 30 mL

## 2021-04-25 MED ORDER — PROPOFOL 10 MG/ML IV BOLUS
INTRAVENOUS | Status: DC | PRN
  Administered 2021-04-25: 20 mg via INTRAVENOUS
  Administered 2021-04-25: 10 mg via INTRAVENOUS
  Administered 2021-04-25: 20 mg via INTRAVENOUS

## 2021-04-25 MED ORDER — LIDOCAINE 2% (20 MG/ML) 5 ML SYRINGE
INTRAMUSCULAR | Status: DC | PRN
  Administered 2021-04-25: 60 mg via INTRAVENOUS

## 2021-04-25 SURGICAL SUPPLY — 57 items
ADH SKN CLS APL DERMABOND .7 (GAUZE/BANDAGES/DRESSINGS) ×1
ATTUNE MED ANAT PAT 35 KNEE (Knees) ×1 IMPLANT
ATTUNE PSFEM LTSZ4 NARCEM KNEE (Femur) ×1 IMPLANT
ATTUNE PSRP INSR SZ4 8 KNEE (Insert) ×1 IMPLANT
BAG COUNTER SPONGE SURGICOUNT (BAG) IMPLANT
BAG SPEC THK2 15X12 ZIP CLS (MISCELLANEOUS)
BAG SPNG CNTER NS LX DISP (BAG)
BAG ZIPLOCK 12X15 (MISCELLANEOUS) IMPLANT
BASEPLATE TIBIAL ROTATING SZ 4 (Knees) ×1 IMPLANT
BLADE SAW SGTL 11.0X1.19X90.0M (BLADE) IMPLANT
BLADE SAW SGTL 13.0X1.19X90.0M (BLADE) ×2 IMPLANT
BLADE SURG SZ10 CARB STEEL (BLADE) ×4 IMPLANT
BNDG ELASTIC 6X5.8 VLCR STR LF (GAUZE/BANDAGES/DRESSINGS) ×2 IMPLANT
BOWL SMART MIX CTS (DISPOSABLE) ×2 IMPLANT
BSPLAT TIB 4 CMNT ROT PLAT STR (Knees) ×1 IMPLANT
CEMENT HV SMART SET (Cement) ×2 IMPLANT
CUFF TOURN SGL QUICK 34 (TOURNIQUET CUFF) ×2
CUFF TRNQT CYL 34X4.125X (TOURNIQUET CUFF) ×1 IMPLANT
DECANTER SPIKE VIAL GLASS SM (MISCELLANEOUS) ×4 IMPLANT
DERMABOND ADVANCED (GAUZE/BANDAGES/DRESSINGS) ×1
DERMABOND ADVANCED .7 DNX12 (GAUZE/BANDAGES/DRESSINGS) ×1 IMPLANT
DRAPE U-SHAPE 47X51 STRL (DRAPES) ×2 IMPLANT
DRESSING AQUACEL AG SP 3.5X10 (GAUZE/BANDAGES/DRESSINGS) ×1 IMPLANT
DRSG AQUACEL AG ADV 3.5X10 (GAUZE/BANDAGES/DRESSINGS) ×1 IMPLANT
DRSG AQUACEL AG SP 3.5X10 (GAUZE/BANDAGES/DRESSINGS) ×2
DURAPREP 26ML APPLICATOR (WOUND CARE) ×4 IMPLANT
ELECT REM PT RETURN 15FT ADLT (MISCELLANEOUS) ×2 IMPLANT
GLOVE SURG ENC MOIS LTX SZ6 (GLOVE) IMPLANT
GLOVE SURG UNDER LTX SZ7.5 (GLOVE) ×2 IMPLANT
GLOVE SURG UNDER POLY LF SZ6.5 (GLOVE) IMPLANT
GLOVE SURG UNDER POLY LF SZ7.5 (GLOVE) ×2 IMPLANT
GOWN STRL REUS W/TWL LRG LVL3 (GOWN DISPOSABLE) ×2 IMPLANT
HANDPIECE INTERPULSE COAX TIP (DISPOSABLE) ×2
HOLDER FOLEY CATH W/STRAP (MISCELLANEOUS) IMPLANT
KIT TURNOVER KIT A (KITS) ×2 IMPLANT
MANIFOLD NEPTUNE II (INSTRUMENTS) ×2 IMPLANT
NDL SAFETY ECLIPSE 18X1.5 (NEEDLE) IMPLANT
NEEDLE HYPO 18GX1.5 SHARP (NEEDLE)
NS IRRIG 1000ML POUR BTL (IV SOLUTION) ×2 IMPLANT
PACK TOTAL KNEE CUSTOM (KITS) ×2 IMPLANT
PENCIL SMOKE EVACUATOR (MISCELLANEOUS) IMPLANT
PIN DRILL FIX HALF THREAD (BIT) ×1 IMPLANT
PIN FIX SIGMA LCS THRD HI (PIN) ×1 IMPLANT
PIN STEINMAN FIXATION KNEE (PIN) ×1 IMPLANT
PROTECTOR NERVE ULNAR (MISCELLANEOUS) ×2 IMPLANT
SET HNDPC FAN SPRY TIP SCT (DISPOSABLE) ×1 IMPLANT
SET PAD KNEE POSITIONER (MISCELLANEOUS) ×2 IMPLANT
SUT MNCRL AB 4-0 PS2 18 (SUTURE) ×2 IMPLANT
SUT STRATAFIX PDS+ 0 24IN (SUTURE) ×2 IMPLANT
SUT VIC AB 1 CT1 36 (SUTURE) ×2 IMPLANT
SUT VIC AB 2-0 CT1 27 (SUTURE) ×6
SUT VIC AB 2-0 CT1 TAPERPNT 27 (SUTURE) ×3 IMPLANT
SYR 3ML LL SCALE MARK (SYRINGE) ×2 IMPLANT
TRAY FOLEY MTR SLVR 16FR STAT (SET/KITS/TRAYS/PACK) ×2 IMPLANT
TUBE SUCTION HIGH CAP CLEAR NV (SUCTIONS) ×2 IMPLANT
WATER STERILE IRR 1000ML POUR (IV SOLUTION) ×4 IMPLANT
WRAP KNEE MAXI GEL POST OP (GAUZE/BANDAGES/DRESSINGS) ×2 IMPLANT

## 2021-04-25 NOTE — Anesthesia Procedure Notes (Signed)
Procedure Name: MAC Date/Time: 04/25/2021 7:30 AM Performed by: Claudia Desanctis, CRNA Oxygen Delivery Method: Simple face mask

## 2021-04-25 NOTE — Transfer of Care (Signed)
Immediate Anesthesia Transfer of Care Note  Patient: Cassandra Little  Procedure(s) Performed: TOTAL KNEE ARTHROPLASTY (Left: Knee)  Patient Location: PACU  Anesthesia Type:Regional and Spinal  Level of Consciousness: awake, alert , oriented and patient cooperative  Airway & Oxygen Therapy: Patient Spontanous Breathing and Patient connected to face mask  Post-op Assessment: Report given to RN and Post -op Vital signs reviewed and stable  Post vital signs: Reviewed and stable  Last Vitals:  Vitals Value Taken Time  BP 106/71 04/25/21 0900  Temp    Pulse 68 04/25/21 0902  Resp 14 04/25/21 0902  SpO2 91 % 04/25/21 0902  Vitals shown include unvalidated device data.  Last Pain:  Vitals:   04/25/21 0550  TempSrc:   PainSc: 0-No pain      Patients Stated Pain Goal: 3 (89/79/15 0413)  Complications: No notable events documented.

## 2021-04-25 NOTE — Discharge Instructions (Signed)

## 2021-04-25 NOTE — Anesthesia Procedure Notes (Signed)
Spinal  Patient location during procedure: OR Start time: 04/25/2021 7:17 AM End time: 04/25/2021 7:22 AM Reason for block: surgical anesthesia Staffing Performed: anesthesiologist  Anesthesiologist: Audry Pili, MD Preanesthetic Checklist Completed: patient identified, IV checked, risks and benefits discussed, surgical consent, monitors and equipment checked, pre-op evaluation and timeout performed Spinal Block Patient position: sitting Prep: DuraPrep Patient monitoring: heart rate, cardiac monitor, continuous pulse ox and blood pressure Approach: midline Location: L2-3 Injection technique: single-shot Needle Needle type: Pencan  Needle gauge: 24 G Additional Notes Consent was obtained prior to the procedure with all questions answered and concerns addressed. Risks including, but not limited to, bleeding, infection, nerve damage, paralysis, failed block, inadequate analgesia, allergic reaction, high spinal, itching, and headache were discussed and the patient wished to proceed. Functioning IV was confirmed and monitors were applied. Sterile prep and drape, including hand hygiene, mask, and sterile gloves were used. The patient was positioned and the spine was prepped. The skin was anesthetized with lidocaine. Free flow of clear CSF was obtained prior to injecting local anesthetic into the CSF. The spinal needle aspirated freely following injection. The needle was carefully withdrawn. The patient tolerated the procedure well.   Renold Don, MD

## 2021-04-25 NOTE — Anesthesia Procedure Notes (Signed)
Anesthesia Regional Block: Adductor canal block   Pre-Anesthetic Checklist: , timeout performed,  Correct Patient, Correct Site, Correct Laterality,  Correct Procedure, Correct Position, site marked,  Risks and benefits discussed,  Surgical consent,  Pre-op evaluation,  At surgeon's request and post-op pain management  Laterality: Left  Prep: chloraprep       Needles:  Injection technique: Single-shot  Needle Type: Echogenic Needle     Needle Length: 10cm  Needle Gauge: 21     Additional Needles:   Narrative:  Start time: 04/25/2021 6:52 AM End time: 04/25/2021 6:55 AM Injection made incrementally with aspirations every 5 mL.  Performed by: Personally  Anesthesiologist: Audry Pili, MD  Additional Notes: No pain on injection. No increased resistance to injection. Injection made in 5cc increments. Good needle visualization. Patient tolerated the procedure well.

## 2021-04-25 NOTE — Plan of Care (Signed)
  Problem: Education: Goal: Knowledge of General Education information will improve Description: Including pain rating scale, medication(s)/side effects and non-pharmacologic comfort measures Outcome: Progressing   Problem: Activity: Goal: Risk for activity intolerance will decrease Outcome: Progressing   Problem: Pain Managment: Goal: General experience of comfort will improve Outcome: Progressing   

## 2021-04-25 NOTE — Op Note (Signed)
NAME:  Cassandra Little                      MEDICAL RECORD NO.:  [id number]                             FACILITY:  [facility]      PHYSICIAN:  Pietro Cassis. Alvan Dame, M.D.  DATE OF BIRTH:  [birth year]      DATE OF PROCEDURE:  04/25/2021                                     OPERATIVE REPORT         PREOPERATIVE DIAGNOSIS:  Left knee osteoarthritis.      POSTOPERATIVE DIAGNOSIS:  Left knee osteoarthritis.      FINDINGS:  The patient was noted to have complete loss of cartilage and   bone-on-bone arthritis with associated osteophytes in the lateral and patellofemoral compartments of   the knee with a significant synovitis and associated effusion.  The patient had failed months of conservative treatment including medications, injection therapy, activity modification.     PROCEDURE:  Left total knee replacement.      COMPONENTS USED:  DePuy Attune rotating platform posterior stabilized knee   system, a size 4N femur, 4 tibia, size 8 mm PS AOX insert, and 35 anatomic patellar   button.      SURGEON:  Pietro Cassis. Alvan Dame, M.D.      ASSISTANT:  Costella Hatcher, PA-C.      ANESTHESIA:  Regional and Spinal.      SPECIMENS:  None.      COMPLICATION:  None.      DRAINS:  None.  EBL: <100 cc      TOURNIQUET TIME:   Total Tourniquet Time Documented: Thigh (Left) - 24 minutes Total: Thigh (Left) - 24 minutes  .      The patient was stable to the recovery room.      INDICATION FOR PROCEDURE:  Cassandra Little is a 75 y.o. female patient of   mine.  The patient had been seen, evaluated, and treated for months conservatively in the   office with medication, activity modification, and injections.  The patient had   radiographic changes of bone-on-bone arthritis with endplate sclerosis and osteophytes noted.  Based on the radiographic changes and failed conservative measures, the patient   decided to proceed with definitive treatment, total knee replacement.  Risks of infection, DVT, component failure,  need for revision surgery, neurovascular injury were reviewed in the office setting.  The postop course was reviewed stressing the efforts to maximize post-operative satisfaction and function.  Consent was obtained for benefit of pain   relief.      PROCEDURE IN DETAIL:  The patient was brought to the operative theater.   Once adequate anesthesia, preoperative antibiotics, 2 gm of Ancef,1 gm of Tranexamic Acid, and 10 mg of Decadron administered, the patient was positioned supine with a left thigh tourniquet placed.  The  left lower extremity was prepped and draped in sterile fashion.  A time-   out was performed identifying the patient, planned procedure, and the appropriate extremity.      The left lower extremity was placed in the [facility] leg holder.  The leg was   exsanguinated, tourniquet elevated to 250 mmHg.  A midline incision was  made followed by median parapatellar arthrotomy.  Following initial   exposure, attention was first directed to the patella.  Precut   measurement was noted to be 21 mm.  I resected down to 13-14 mm and used a   35 anatomic patellar button to restore patellar height as well as cover the cut surface.      The lug holes were drilled and a metal shim was placed to protect the   patella from retractors and saw blade during the procedure.      At this point, attention was now directed to the femur.  The femoral   canal was opened with a drill, irrigated to try to prevent fat emboli.  An   intramedullary rod was passed at 3 degrees valgus, 9 mm of bone was   resected off the distal femur.  Following this resection, the tibia was   subluxated anteriorly.  Using the extramedullary guide, 2 mm of bone was resected off   the proximal lateral tibia.  We confirmed the gap would be   stable medially and laterally with a size 6 spacer block as well as confirmed that the tibial cut was perpendicular in the coronal plane, checking with an alignment rod.      Once this  was done, I sized the femur to be a size 4 in the anterior-   posterior dimension, chose a narrow component based on medial and   lateral dimension.  The size 4 rotation block was then pinned in   position anterior referenced using the C-clamp to set rotation.  The   anterior, posterior, and  chamfer cuts were made without difficulty nor   notching making certain that I was along the anterior cortex to help   with flexion gap stability.      The final box cut was made off the lateral aspect of distal femur.      At this point, the tibia was sized to be a size 4.  The size 4 tray was   then pinned in position through the medial third of the tubercle,   drilled, and keel punched.  Trial reduction was now carried with a 4 femur,  4 tibia, a size 8 mm PS insert, and the 35 anatomic patella botton.  The knee was brought to full extension with good flexion stability with the patella   tracking through the trochlea without application of pressure.  Given   all these findings the trial components removed.  Final components were   opened and cement was mixed.  The knee was irrigated with normal saline solution and pulse lavage.  The synovial lining was   then injected with 30 cc of 0.25% Marcaine with epinephrine, 1 cc of Toradol and 30 cc of NS for a total of 61 cc.     Final implants were then cemented onto cleaned and dried cut surfaces of bone with the knee brought to extension with a size 8 mm PS trial insert.      Once the cement had fully cured, excess cement was removed   throughout the knee.  I confirmed that I was satisfied with the range of   motion and stability, and the final size 8 mm PS AOX insert was chosen.  It was   placed into the knee.      The tourniquet had been let down at 24 minutes.  No significant   hemostasis was required.  The extensor mechanism was then reapproximated using #1 Vicryl  and #1 Stratafix sutures with the knee   in flexion.  The   remaining wound was  closed with 2-0 Vicryl and running 4-0 Monocryl.   The knee was cleaned, dried, dressed sterilely using Dermabond and   Aquacel dressing.  The patient was then   brought to recovery room in stable condition, tolerating the procedure   well.   Please note that Physician Assistant, Costella Hatcher, PA-C was present for the entirety of the case, and was utilized for pre-operative positioning, peri-operative retractor management, general facilitation of the procedure and for primary wound closure at the end of the case.              Pietro Cassis Alvan Dame, M.D.    04/25/2021 8:35 AM

## 2021-04-25 NOTE — Evaluation (Signed)
Physical Therapy Evaluation Patient Details Name: Cassandra Little MRN: [id number] DOB: December 10, 1945 Today's Date: 04/25/2021   History of Present Illness  Pt is a 75 year old female s/p Lt TKA with hx of R TKA 2017  Clinical Impression  Pt is s/p TKA resulting in the deficits listed below (see PT Problem List). Pt will benefit from skilled PT to increase their independence and safety with mobility to allow discharge to the venue listed below.  Pt reports an active lifestyle prior to surgery and plans to f/u with OPPT upon d/c.  Pt states she has stairs to her bedroom and will have friends assisting her at home upon d/c.      Follow Up Recommendations Follow surgeon's recommendation for DC plan and follow-up therapies    Equipment Recommendations  None recommended by PT    Recommendations for Other Services       Precautions / Restrictions Precautions Precautions: Fall;Knee Restrictions Weight Bearing Restrictions: No Other Position/Activity Restrictions: WBAT      Mobility  Bed Mobility Overal bed mobility: Needs Assistance Bed Mobility: Supine to Sit     Supine to sit: Supervision;HOB elevated          Transfers Overall transfer level: Needs assistance Equipment used: Rolling walker (2 wheeled) Transfers: Sit to/from Stand Sit to Stand: Min guard         General transfer comment: min/guard for safety, verbal cues for UE and LE positioning  Ambulation/Gait Ambulation/Gait assistance: Min guard Gait Distance (Feet): 160 Feet Assistive device: Rolling walker (2 wheeled) Gait Pattern/deviations: Step-to pattern;Antalgic;Decreased stance time - left     General Gait Details: verbal cues for sequence, RW positioning, step length  Stairs            Wheelchair Mobility    Modified Rankin (Stroke Patients Only)       Balance                                             Pertinent Vitals/Pain Pain Assessment: 0-10 Pain Score: 3   Pain Location: left knee Pain Descriptors / Indicators: Aching;Sore Pain Intervention(s): Repositioned;Premedicated before session;Monitored during session    [place] expects to be discharged to:: Private residence Living Arrangements: Alone Available Help at Discharge: Friend(s);Available 24 hours/day   Home Access: Stairs to enter Entrance Stairs-Rails: None Entrance Stairs-Number of Steps: 2 Home Layout: Two level;Bed/bath upstairs Home Equipment: Walker - 2 wheels;Bedside commode      Prior Function Level of Independence: Independent               Hand Dominance        Extremity/Trunk Assessment        Lower Extremity Assessment Lower Extremity Assessment: LLE deficits/detail LLE Deficits / Details: approx -5-85* AAROM left knee flexion       Communication   Communication: No difficulties  Cognition Arousal/Alertness: Awake/alert Behavior During Therapy: WFL for tasks assessed/performed Overall Cognitive Status: Within Functional Limits for tasks assessed                                        General Comments      Exercises     Assessment/Plan    PT Assessment Patient needs continued PT services  PT Problem List Decreased strength;Decreased  range of motion;Decreased mobility;Decreased knowledge of use of DME;Pain       PT Treatment Interventions Stair training;Gait training;Balance training;DME instruction;Therapeutic exercise;Functional mobility training;Therapeutic activities;Patient/family education    PT Goals (Current goals can be found in the Care Plan section)  Acute Rehab PT Goals Patient Stated Goal: hiking trip later this year PT Goal Formulation: With patient Time For Goal Achievement: 04/29/21 Potential to Achieve Goals: Good    Frequency 7X/week   Barriers to discharge        Co-evaluation               AM-PAC PT "6 Clicks" Mobility  Outcome Measure Help needed turning from  your back to your side while in a flat bed without using bedrails?: A Little Help needed moving from lying on your back to sitting on the side of a flat bed without using bedrails?: A Little Help needed moving to and from a bed to a chair (including a wheelchair)?: A Little Help needed standing up from a chair using your arms (e.g., wheelchair or bedside chair)?: A Little Help needed to walk in hospital room?: A Little Help needed climbing 3-5 steps with a railing? : A Little 6 Click Score: 18    End of Session Equipment Utilized During Treatment: Gait belt Activity Tolerance: Patient tolerated treatment well Patient left: in chair;with call bell/phone within reach Nurse Communication: Mobility status PT Visit Diagnosis: Other abnormalities of gait and mobility (R26.89)    Time: 2130-8657 PT Time Calculation (min) (ACUTE ONLY): 17 min   Charges:   PT Evaluation $PT Eval Low Complexity: 1 Low         Kati PT, DPT Acute Rehabilitation Services Pager: [phone] Office: [phone]   York Ram E 04/25/2021, 3:23 PM

## 2021-04-25 NOTE — Interval H&P Note (Signed)
History and Physical Interval Note:  04/25/2021 7:07 AM  Cassandra Little  has presented today for surgery, with the diagnosis of Left knee osteoarthritis.  The various methods of treatment have been discussed with the patient and family. After consideration of risks, benefits and other options for treatment, the patient has consented to  Procedure(s) with comments: TOTAL KNEE ARTHROPLASTY (Left) - 70 mins as a surgical intervention.  The patient's history has been reviewed, patient examined, no change in status, stable for surgery.  I have reviewed the patient's chart and labs.  Questions were answered to the patient's satisfaction.     Mauri Pole

## 2021-04-25 NOTE — Anesthesia Postprocedure Evaluation (Signed)
Anesthesia Post Note  Patient: Cassandra Little  Procedure(s) Performed: TOTAL KNEE ARTHROPLASTY (Left: Knee)     Patient location during evaluation: PACU Anesthesia Type: Spinal Level of consciousness: awake and alert Pain management: pain level controlled Vital Signs Assessment: post-procedure vital signs reviewed and stable Respiratory status: spontaneous breathing and respiratory function stable Cardiovascular status: blood pressure returned to baseline and stable Postop Assessment: spinal receding and no apparent nausea or vomiting Anesthetic complications: no   No notable events documented.  Last Vitals:  Vitals:   04/25/21 0940 04/25/21 1000  BP:  (!) 122/55  Pulse: (!) 58 (!) 55  Resp: 13 16  Temp: 36.6 C 36.7 C  SpO2: 95% 94%    Last Pain:  Vitals:   04/25/21 1000  TempSrc: Oral  PainSc: 0-No pain                 Audry Pili

## 2021-04-26 ENCOUNTER — Encounter (HOSPITAL_COMMUNITY): Payer: Self-pay | Admitting: Orthopedic Surgery

## 2021-04-26 DIAGNOSIS — M1712 Unilateral primary osteoarthritis, left knee: Secondary | ICD-10-CM | POA: Diagnosis not present

## 2021-04-26 DIAGNOSIS — Z96651 Presence of right artificial knee joint: Secondary | ICD-10-CM | POA: Diagnosis not present

## 2021-04-26 DIAGNOSIS — Z96612 Presence of left artificial shoulder joint: Secondary | ICD-10-CM | POA: Diagnosis not present

## 2021-04-26 DIAGNOSIS — I10 Essential (primary) hypertension: Secondary | ICD-10-CM | POA: Diagnosis not present

## 2021-04-26 DIAGNOSIS — Z96611 Presence of right artificial shoulder joint: Secondary | ICD-10-CM | POA: Diagnosis not present

## 2021-04-26 DIAGNOSIS — Z79899 Other long term (current) drug therapy: Secondary | ICD-10-CM | POA: Diagnosis not present

## 2021-04-26 LAB — BASIC METABOLIC PANEL
Anion gap: 5 (ref 5–15)
BUN: 23 mg/dL (ref 8–23)
CO2: 29 mmol/L (ref 22–32)
Calcium: 8.7 mg/dL — ABNORMAL LOW (ref 8.9–10.3)
Chloride: 106 mmol/L (ref 98–111)
Creatinine, Ser: 0.82 mg/dL (ref 0.44–1.00)
GFR, Estimated: >60 mL/min (ref >60)
Glucose, Bld: 120 mg/dL — ABNORMAL HIGH (ref 70–99)
Potassium: 4.3 mmol/L (ref 3.5–5.1)
Sodium: 140 mmol/L (ref 135–145)

## 2021-04-26 LAB — CBC
HCT: 31 % — ABNORMAL LOW (ref 36.0–46.0)
Hemoglobin: 10 g/dL — ABNORMAL LOW (ref 12.0–15.0)
MCH: 29.3 pg (ref 26.0–34.0)
MCHC: 32.3 g/dL (ref 30.0–36.0)
MCV: 90.9 fL (ref 80.0–100.0)
Platelets: 196 10*3/uL (ref 150–400)
RBC: 3.41 MIL/uL — ABNORMAL LOW (ref 3.87–5.11)
RDW: 14 % (ref 11.5–15.5)
WBC: 11.9 10*3/uL — ABNORMAL HIGH (ref 4.0–10.5)
nRBC: 0 % (ref 0.0–0.2)

## 2021-04-26 MED ORDER — CELECOXIB 200 MG PO CAPS: 200.0000 mg | ORAL_CAPSULE | Freq: Two times a day (BID) | ORAL | 0 refills | Status: DC

## 2021-04-26 MED ORDER — POLYETHYLENE GLYCOL 3350 17 G PO PACK: 17.0000 g | PACK | Freq: Every day | ORAL | 0 refills | Status: DC | PRN

## 2021-04-26 MED ORDER — METHOCARBAMOL 500 MG PO TABS: 500.0000 mg | ORAL_TABLET | Freq: Four times a day (QID) | ORAL | 0 refills | Status: DC | PRN

## 2021-04-26 MED ORDER — ASPIRIN 81 MG PO CHEW: 81.0000 mg | CHEWABLE_TABLET | Freq: Two times a day (BID) | ORAL | 0 refills | Status: AC

## 2021-04-26 MED ORDER — ACETAMINOPHEN 325 MG PO TABS: 1000.0000 mg | ORAL_TABLET | Freq: Three times a day (TID) | ORAL | Status: DC

## 2021-04-26 MED ORDER — DOCUSATE SODIUM 100 MG PO CAPS: 100.0000 mg | ORAL_CAPSULE | Freq: Two times a day (BID) | ORAL | 0 refills | Status: DC

## 2021-04-26 MED ORDER — OXYCODONE HCL 5 MG PO TABS: 5.0000 mg | ORAL_TABLET | Freq: Four times a day (QID) | ORAL | 0 refills | Status: DC | PRN

## 2021-04-26 NOTE — TOC Transition Note (Signed)
Transition of Care [facility]) - CM/SW Discharge Note  Patient Details  Name: Cassandra Little MRN: [id number] Date of Birth: 1946/08/11  [facility]) CM/SW Contact:  Sherie Don, LCSW Phone Number: 04/26/2021, 11:26 AM  Clinical Narrative: Patient to discharge after working with PT. CSW met with patient to review needs. Patient to discharge home with OPPT in the same building as her orthopedist. Patient has a rolling walker, but declined a 3N1 as she has high toilets at home. TOC signing off.  Final next level of care: OP Rehab Barriers to Discharge: No Barriers Identified  Patient Goals and CMS Choice Patient states their goals for this hospitalization and ongoing recovery are:: Discharge home with [place] CMS Medicare.gov Compare Post Acute Care list provided to:: Patient Choice offered to / list presented to : Patient  Discharge Plan and Services       DME Arranged: N/A DME Agency: NA HH Arranged: NA [place] Agency: NA  Readmission Risk Interventions No flowsheet data found.

## 2021-04-26 NOTE — Care Plan (Signed)
Ortho Bundle Case Management Note  Patient Details  Name: Cassandra Little MRN: [id number] Date of Birth: Mar 06, 1946  L TKA on 04-25-21 DCP:  Home with friend.  2 story home with 2 ste DME:  No needs.  Has a RW and doesn't want a 3-in-1 PT:  [facility].  Eval scheduled on 04-28-21.                   DME Arranged:  N/A DME Agency:  NA  HH Arranged:  NA HH Agency:  NA  Additional Comments: Please contact me with any questions of if this plan should need to change.  Marianne Sofia, RN,CCM [facility]  [phone] 04/26/2021, 9:31 AM

## 2021-04-26 NOTE — Progress Notes (Signed)
Subjective: 1 Day Post-Op Procedure(s) (LRB): TOTAL KNEE ARTHROPLASTY (Left) Patient reports pain as mild.   Patient seen in rounds for Dr. Alvan Dame. Patient is well, and has had no acute complaints or problems. No acute events overnight. Foley catheter removed. Patient ambulated 160 feet with PT.  We will continue therapy today.   Objective: Vital signs in last 24 hours: Temp:  [97.7 F (36.5 C)-98.8 F (37.1 C)] 98.2 F (36.8 C) (07/13 0531) Pulse Rate:  [54-75] 73 (07/13 0531) Resp:  [11-18] 18 (07/13 0531) BP: (106-122)/(55-73) 115/73 (07/13 0531) SpO2:  [91 %-100 %] 96 % (07/13 0531)  Intake/Output from previous day:  Intake/Output Summary (Last 24 hours) at 04/26/2021 0830 Last data filed at 04/26/2021 0531 Gross per 24 hour  Intake 2049.36 ml  Output 2150 ml  Net -100.64 ml     Intake/Output this shift: No intake/output data recorded.  Labs: Recent Labs    04/26/21 0256  HGB 10.0*   Recent Labs    04/26/21 0256  WBC 11.9*  RBC 3.41*  HCT 31.0*  PLT 196   Recent Labs    04/26/21 0256  NA 140  K 4.3  CL 106  CO2 29  BUN 23  CREATININE 0.82  GLUCOSE 120*  CALCIUM 8.7*   No results for input(s): LABPT, INR in the last 72 hours.  Exam: General - Patient is Alert and Oriented Extremity - Neurologically intact Sensation intact distally Intact pulses distally Dorsiflexion/Plantar flexion intact Dressing - dressing C/D/I Motor Function - intact, moving foot and toes well on exam.   Past Medical History:  Diagnosis Date   Anaphylaxis 02/14/2011   Unknown cause.  Always carries Epipen.   Angioedema    Anxiety    Arthritis    Cough    Depression    Dr. Abner Greenspan   Exposure to hepatitis C    GERD (gastroesophageal reflux disease)    Hyperlipidemia    Hypertension    IBS (irritable bowel syndrome)    Internal and external bleeding hemorrhoids    Migraine    PONV (postoperative nausea and vomiting)    Rosacea     Assessment/Plan: 1 Day  Post-Op Procedure(s) (LRB): TOTAL KNEE ARTHROPLASTY (Left) Active Problems:   S/P total knee arthroplasty, left  Estimated body mass index is 30.9 kg/m as calculated from the following:   Height as of this encounter: 4\' 11"  (1.499 m).   Weight as of this encounter: 69.4 kg. Advance diet Up with therapy D/C IV fluids   Patient's anticipated LOS is less than 2 midnights, meeting these requirements: - Younger than 7 - Lives within 1 hour of care - Has a competent adult at home to recover with post-op recover - NO history of  - Chronic pain requiring opiods  - Diabetes  - Coronary Artery Disease  - Heart failure  - Heart attack  - Stroke  - DVT/VTE  - Cardiac arrhythmia  - Respiratory Failure/COPD  - Renal failure  - Anemia  - Advanced Liver disease     DVT Prophylaxis - Aspirin Weight bearing as tolerated.  Plan is to go Home after hospital stay. Plan for discharge today following 1-2 sessions of PT as long as they are meeting their goals.   Patient is supposed to be scheduled for OPPT at [facility], but is not sure of when this is set up for.   Follow up in the office in 2 weeks.   Griffith Citron, PA-C Orthopedic Surgery [phone] 04/26/2021, 8:30  AM  

## 2021-04-26 NOTE — Progress Notes (Signed)
Physical Therapy Treatment Patient Details Name: Cassandra Little MRN: [id number] DOB: 04-Oct-1946 Today's Date: 04/26/2021    History of Present Illness Pt is a 75 year old female s/p Lt TKA with hx of R TKA 2017    PT Comments    Pt ambulated in hallway and practiced safe stair technique with friend observing and then holding gait belt.  Pt also performed LE exercises and provided with HEP.  Pt feels ready to d/c home today and had no further questions.    Follow Up Recommendations  Follow surgeon's recommendation for DC plan and follow-up therapies     Equipment Recommendations  None recommended by PT    Recommendations for Other Services       Precautions / Restrictions Precautions Precautions: Fall;Knee Restrictions LLE Weight Bearing: Weight bearing as tolerated Other Position/Activity Restrictions: WBAT    Mobility  Bed Mobility               General bed mobility comments: pt in recliner on arrival    Transfers Overall transfer level: Needs assistance Equipment used: Rolling walker (2 wheeled) Transfers: Sit to/from Stand Sit to Stand: Supervision         General transfer comment: verbal cues for UE and LE positioning  Ambulation/Gait Ambulation/Gait assistance: Supervision Gait Distance (Feet): 160 Feet Assistive device: Rolling walker (2 wheeled) Gait Pattern/deviations: Step-to pattern;Antalgic;Decreased stance time - left;Step-through pattern     General Gait Details: verbal cues for sequence, RW positioning, step length   Stairs Stairs: Yes Stairs assistance: Min guard Stair Management: Step to pattern;Forwards;One rail Left Number of Stairs: 3 General stair comments: verbal cues for sequence and safety; pt performed twice and reports understanding, pt's friend also assisted with holding gait belt once   Wheelchair Mobility    Modified Rankin (Stroke Patients Only)       Balance                                             Cognition Arousal/Alertness: Awake/alert Behavior During Therapy: WFL for tasks assessed/performed Overall Cognitive Status: Within Functional Limits for tasks assessed                                        Exercises Total Joint Exercises Ankle Circles/Pumps: AROM;Both;10 reps Quad Sets: AROM;Left;10 reps Heel Slides: AAROM;Left;10 reps Hip ABduction/ADduction: AROM;Left;10 reps Straight Leg Raises: AROM;Left;10 reps Knee Flexion: AAROM;Seated;Left;10 reps Goniometric ROM: approx 110* left knee AAROM in siting    General Comments        Pertinent Vitals/Pain Pain Assessment: 0-10 Pain Score: 5  Pain Location: left knee Pain Descriptors / Indicators: Aching;Sore Pain Intervention(s): Monitored during session;Repositioned    Home Living                      Prior Function            PT Goals (current goals can now be found in the care plan section) Progress towards PT goals: Progressing toward goals    Frequency    7X/week      PT Plan Current plan remains appropriate    Co-evaluation              AM-PAC PT "6 Clicks" Mobility   Outcome Measure  Help  needed turning from your back to your side while in a flat bed without using bedrails?: A Little Help needed moving from lying on your back to sitting on the side of a flat bed without using bedrails?: A Little Help needed moving to and from a bed to a chair (including a wheelchair)?: A Little Help needed standing up from a chair using your arms (e.g., wheelchair or bedside chair)?: A Little Help needed to walk in hospital room?: A Little Help needed climbing 3-5 steps with a railing? : A Little 6 Click Score: 18    End of Session Equipment Utilized During Treatment: Gait belt Activity Tolerance: Patient tolerated treatment well Patient left: with call bell/phone within reach;with family/visitor present (bathroom, pt's friend present, pt aware to use call bell or  pull cord for assist) Nurse Communication: Mobility status PT Visit Diagnosis: Other abnormalities of gait and mobility (R26.89)     Time: 3212-2482 PT Time Calculation (min) (ACUTE ONLY): 23 min  Charges:  $Gait Training: 8-22 mins $Therapeutic Exercise: 8-22 mins                    Jannette Spanner PT, DPT Acute Rehabilitation Services Pager: [phone] Office: [phone]   [place] Ram E 04/26/2021, 2:50 PM

## 2021-04-26 NOTE — Progress Notes (Signed)
Physical Therapy Treatment Patient Details Name: Cassandra Little MRN: [id number] DOB: 1945-11-19 Today's Date: 04/26/2021    History of Present Illness Pt is a 75 year old female s/p Lt TKA with hx of R TKA 2017    PT Comments    Pt ambulated in hallway and practiced safe stair technique.  Pt preferred for another visit to ambulate again and perform LE exercises prior to d/c home today.    Follow Up Recommendations  Follow surgeon's recommendation for DC plan and follow-up therapies     Equipment Recommendations  None recommended by PT    Recommendations for Other Services       Precautions / Restrictions Precautions Precautions: Fall;Knee Restrictions Other Position/Activity Restrictions: WBAT    Mobility  Bed Mobility               General bed mobility comments: pt in recliner on arrival    Transfers Overall transfer level: Needs assistance Equipment used: Rolling walker (2 wheeled) Transfers: Sit to/from Stand Sit to Stand: Min guard         General transfer comment: verbal cues for UE and LE positioning  Ambulation/Gait Ambulation/Gait assistance: Min guard Gait Distance (Feet): 160 Feet Assistive device: Rolling walker (2 wheeled) Gait Pattern/deviations: Step-to pattern;Antalgic;Decreased stance time - left     General Gait Details: verbal cues for sequence, RW positioning, step length   Stairs Stairs: Yes Stairs assistance: Min guard Stair Management: Step to pattern;Forwards;One rail Left Number of Stairs: 2 General stair comments: verbal cues for sequence and safety; pt performed twice and reports understanding   Wheelchair Mobility    Modified Rankin (Stroke Patients Only)       Balance                                            Cognition Arousal/Alertness: Awake/alert Behavior During Therapy: WFL for tasks assessed/performed Overall Cognitive Status: Within Functional Limits for tasks assessed                                         Exercises      General Comments        Pertinent Vitals/Pain Pain Assessment: 0-10 Pain Score: 4  Pain Location: left knee Pain Descriptors / Indicators: Aching;Sore Pain Intervention(s): Repositioned;Monitored during session;Ice applied    Home Living                      Prior Function            PT Goals (current goals can now be found in the care plan section) Progress towards PT goals: Progressing toward goals    Frequency    7X/week      PT Plan Current plan remains appropriate    Co-evaluation              AM-PAC PT "6 Clicks" Mobility   Outcome Measure  Help needed turning from your back to your side while in a flat bed without using bedrails?: A Little Help needed moving from lying on your back to sitting on the side of a flat bed without using bedrails?: A Little Help needed moving to and from a bed to a chair (including a wheelchair)?: A Little Help needed standing up from a  chair using your arms (e.g., wheelchair or bedside chair)?: A Little Help needed to walk in hospital room?: A Little Help needed climbing 3-5 steps with a railing? : A Little 6 Click Score: 18    End of Session Equipment Utilized During Treatment: Gait belt Activity Tolerance: Patient tolerated treatment well Patient left: in chair;with call bell/phone within reach Nurse Communication: Mobility status PT Visit Diagnosis: Other abnormalities of gait and mobility (R26.89)     Time: 9449-6759 PT Time Calculation (min) (ACUTE ONLY): 16 min  Charges:  $Gait Training: 8-22 mins                    Arlyce Dice, DPT Acute Rehabilitation Services Pager: [phone] Office: [phone]   [place] Ram E 04/26/2021, 12:51 PM

## 2021-05-02 DIAGNOSIS — M25562 Pain in left knee: Secondary | ICD-10-CM | POA: Diagnosis not present

## 2021-05-04 DIAGNOSIS — M25562 Pain in left knee: Secondary | ICD-10-CM | POA: Diagnosis not present

## 2021-05-09 DIAGNOSIS — M25562 Pain in left knee: Secondary | ICD-10-CM | POA: Diagnosis not present

## 2021-05-10 NOTE — Discharge Summary (Signed)
Physician Discharge Summary   Patient ID: Cassandra Little MRN: [id number] DOB/AGE: 1946-02-04 75 y.o.  Admit date: 04/25/2021 Discharge date: 04/26/2021  Primary Diagnosis: Left knee osteoarthritis.  Admission Diagnoses:  Past Medical History:  Diagnosis Date   Anaphylaxis 02/14/2011   Unknown cause.  Always carries Epipen.   Angioedema    Anxiety    Arthritis    Cough    Depression    Dr. Abner Greenspan   Exposure to hepatitis C    GERD (gastroesophageal reflux disease)    Hyperlipidemia    Hypertension    IBS (irritable bowel syndrome)    Internal and external bleeding hemorrhoids    Migraine    PONV (postoperative nausea and vomiting)    Rosacea    Discharge Diagnoses:   Active Problems:   S/P total knee arthroplasty, left  Estimated body mass index is 30.9 kg/m as calculated from the following:   Height as of this encounter: '4\' 11"'$  (1.499 m).   Weight as of this encounter: 69.4 kg.  Procedure:  Procedure(s) (LRB): TOTAL KNEE ARTHROPLASTY (Left)   Consults: None  HPI: Cassandra Little is a 75 y.o. female patient of  mine.  The patient had been seen, evaluated, and treated for months conservatively in the  office with medication, activity modification, and injections.  The patient had  radiographic changes of bone-on-bone arthritis with endplate sclerosis and osteophytes noted.  Based on the radiographic changes and failed conservative measures, the patient  decided to proceed with definitive treatment, total knee replacement.  Risks of infection, DVT, component failure, need for revision surgery, neurovascular injury were reviewed in the office setting.  The postop course was reviewed stressing the efforts to maximize post-operative satisfaction and function.  Consent was obtained for benefit of pain  relief.  Laboratory Data: Admission on 04/25/2021, Discharged on 04/26/2021  Component Date Value Ref Range Status   WBC 04/26/2021 11.9 (A) 4.0 - 10.5 K/uL Final   RBC  04/26/2021 3.41 (A) 3.87 - 5.11 MIL/uL Final   Hemoglobin 04/26/2021 10.0 (A) 12.0 - 15.0 g/dL Final   HCT 04/26/2021 31.0 (A) 36.0 - 46.0 % Final   MCV 04/26/2021 90.9  80.0 - 100.0 fL Final   MCH 04/26/2021 29.3  26.0 - 34.0 pg Final   MCHC 04/26/2021 32.3  30.0 - 36.0 g/dL Final   RDW 04/26/2021 14.0  11.5 - 15.5 % Final   Platelets 04/26/2021 196  150 - 400 K/uL Final   nRBC 04/26/2021 0.0  0.0 - 0.2 % Final   Performed at [facility], [place] [address] [place], [place] [address]   Sodium 04/26/2021 140  135 - 145 mmol/L Final   Potassium 04/26/2021 4.3  3.5 - 5.1 mmol/L Final   Chloride 04/26/2021 106  98 - 111 mmol/L Final   CO2 04/26/2021 29  22 - 32 mmol/L Final   Glucose, Bld 04/26/2021 120 (A) 70 - 99 mg/dL Final   Glucose reference range applies only to samples taken after fasting for at least 8 hours.   BUN 04/26/2021 23  8 - 23 mg/dL Final   Creatinine, Ser 04/26/2021 0.82  0.44 - 1.00 mg/dL Final   Calcium 04/26/2021 8.7 (A) 8.9 - 10.3 mg/dL Final   GFR, Estimated 04/26/2021 >60  >60 mL/min Final   Comment: (NOTE) Calculated using the CKD-EPI Creatinine Equation (2021)    Anion gap 04/26/2021 5  5 - 15 Final   Performed at [facility], [place] [address] [place], [place] [address]  Hospital Outpatient Visit on 04/12/2021  Component Date Value Ref Range Status   WBC 04/12/2021 5.0  4.0 - 10.5 K/uL Final   RBC 04/12/2021 4.72  3.87 - 5.11 MIL/uL Final   Hemoglobin 04/12/2021 13.6  12.0 - 15.0 g/dL Final   HCT 04/12/2021 41.8  36.0 - 46.0 % Final   MCV 04/12/2021 88.6  80.0 - 100.0 fL Final   MCH 04/12/2021 28.8  26.0 - 34.0 pg Final   MCHC 04/12/2021 32.5  30.0 - 36.0 g/dL Final   RDW 04/12/2021 13.9  11.5 - 15.5 % Final   Platelets 04/12/2021 226  150 - 400 K/uL Final   nRBC 04/12/2021 0.0  0.0 - 0.2 % Final   Performed at [place] [address] [place], [place] [address]   Sodium 04/12/2021 140  135 - 145  mmol/L Final   Potassium 04/12/2021 4.1  3.5 - 5.1 mmol/L Final   Chloride 04/12/2021 107  98 - 111 mmol/L Final   CO2 04/12/2021 27  22 - 32 mmol/L Final   Glucose, Bld 04/12/2021 100 (A) 70 - 99 mg/dL Final   Glucose reference range applies only to samples taken after fasting for at least 8 hours.   BUN 04/12/2021 23  8 - 23 mg/dL Final   Creatinine, Ser 04/12/2021 0.76  0.44 - 1.00 mg/dL Final   Calcium 04/12/2021 9.3  8.9 - 10.3 mg/dL Final   Total Protein 04/12/2021 7.2  6.5 - 8.1 g/dL Final   Albumin 04/12/2021 4.2  3.5 - 5.0 g/dL Final   AST 04/12/2021 18  15 - 41 U/L Final   ALT 04/12/2021 19  0 - 44 U/L Final   Alkaline Phosphatase 04/12/2021 56  38 - 126 U/L Final   Total Bilirubin 04/12/2021 0.5  0.3 - 1.2 mg/dL Final   GFR, Estimated 04/12/2021 >60  >60 mL/min Final   Comment: (NOTE) Calculated using the CKD-EPI Creatinine Equation (2021)    Anion gap 04/12/2021 6  5 - 15 Final   Performed at [facility], [place] [address] [place], [place] [address]   Prothrombin Time 04/12/2021 13.3  11.4 - 15.2 seconds Final   INR 04/12/2021 1.0  0.8 - 1.2 Final   Comment: (NOTE) INR goal varies based on device and disease states. Performed at [facility], [place] [address], [place], [place] [address]    aPTT 04/12/2021 29  24 - 36 seconds Final   Performed at [facility], [place] [address] [place], [place] [address]   ABO/RH(D) 04/12/2021 O NEG   Final   Antibody Screen 04/12/2021 NEG   Final   Sample Expiration 04/12/2021 04/26/2021,2359   Final   Extend sample reason 04/12/2021    Final                   Value:NO TRANSFUSIONS OR PREGNANCY IN THE PAST 3 MONTHS Performed at [facility], [place] [address] [place], [place] [address]    MRSA, PCR 04/12/2021 NEGATIVE  NEGATIVE Final   Staphylococcus aureus 04/12/2021 NEGATIVE  NEGATIVE Final   Comment: (NOTE) The Xpert SA Assay (FDA approved for NASAL specimens  in patients 75 years of age and older), is one component of a comprehensive surveillance program. It is not intended to diagnose infection nor to guide or monitor treatment. Performed at [facility], [place] [address], [place], [place] [address]      X-Rays:No  results found.  EKG: Orders placed or performed in visit on 04/11/21   EKG 12-Lead     Hospital Course: Cassandra Little is a 75 y.o. who was admitted to [facility]. They were brought to the operating room on 04/25/2021 and underwent Procedure(s): TOTAL KNEE ARTHROPLASTY.  Patient tolerated the procedure well and was later transferred to the recovery room and then to the orthopaedic floor for postoperative care. They were given PO and IV analgesics for pain control following their surgery. They were given 24 hours of postoperative antibiotics of  Anti-infectives (From admission, onward)    Start     Dose/Rate Route Frequency Ordered Stop   04/25/21 1400  ceFAZolin (ANCEF) 2 g in sodium chloride 0.9 % 100 mL IVPB        2 g 200 mL/hr over 30 Minutes Intravenous Every 6 hours 04/25/21 1025 04/25/21 2152   04/25/21 0600  ceFAZolin (ANCEF) 2 g in sodium chloride 0.9 % 100 mL IVPB        2 g 200 mL/hr over 30 Minutes Intravenous On call 04/25/21 0517 04/25/21 0723      and started on DVT prophylaxis in the form of Aspirin.   PT and OT were ordered for total joint protocol. Discharge planning consulted to help with postop disposition and equipment needs.  Patient had a good night on the evening of surgery. They started to get up OOB with therapy on POD #0. Pt was seen during rounds and was ready to go home pending progress with therapy.She worked with therapy on POD #1 and was meeting her goals. Pt was discharged to home later that day in stable condition.  Diet: Regular diet Activity: WBAT Follow-up: in 2 weeks Disposition: Home Discharged Condition: good   Discharge Instructions     Call MD / Call  911   Complete by: As directed    If you experience chest pain or shortness of breath, CALL 911 and be transported to the hospital emergency room.  If you develope a fever above 101 F, pus (white drainage) or increased drainage or redness at the wound, or calf pain, call your surgeon's office.   Change dressing   Complete by: As directed    Maintain surgical dressing until follow up in the clinic. If the edges start to pull up, may reinforce with tape. If the dressing is no longer working, may remove and cover with gauze and tape, but must keep the area dry and clean.  Call with any questions or concerns.   Constipation Prevention   Complete by: As directed    Drink plenty of fluids.  Prune juice may be helpful.  You may use a stool softener, such as Colace (over the counter) 100 mg twice a day.  Use MiraLax (over the counter) for constipation as needed.   Diet - low sodium heart healthy   Complete by: As directed    Increase activity slowly as tolerated   Complete by: As directed    Weight bearing as tolerated with assist device (walker, cane, etc) as directed, use it as long as suggested by your surgeon or therapist, typically at least 4-6 weeks.   Post-operative opioid taper instructions:   Complete by: As directed    POST-OPERATIVE OPIOID TAPER INSTRUCTIONS: It is important to wean off of your opioid medication as soon as possible. If you do not need pain medication after your surgery it is ok to stop day one. Opioids include: Codeine, Hydrocodone(Norco, Vicodin), Oxycodone(Percocet,  oxycontin) and hydromorphone amongst others.  Long term and even short term use of opiods can cause: Increased pain response Dependence Constipation Depression Respiratory depression And more.  Withdrawal symptoms can include Flu like symptoms Nausea, vomiting And more Techniques to manage these symptoms Hydrate well Eat regular healthy meals Stay active Use relaxation techniques(deep breathing,  meditating, yoga) Do Not substitute Alcohol to help with tapering If you have been on opioids for less than two weeks and do not have pain than it is ok to stop all together.  Plan to wean off of opioids This plan should start within one week post op of your joint replacement. Maintain the same interval or time between taking each dose and first decrease the dose.  Cut the total daily intake of opioids by one tablet each day Next start to increase the time between doses. The last dose that should be eliminated is the evening dose.      TED hose   Complete by: As directed    Use stockings (TED hose) for 2 weeks on both leg(s).  You may remove them at night for sleeping.      Allergies as of 04/26/2021       Reactions   Ace Inhibitors Cough        Medication List     STOP taking these medications    ibuprofen 200 MG tablet Commonly known as: ADVIL   naproxen sodium 220 MG tablet Commonly known as: ALEVE       TAKE these medications    acetaminophen 325 MG tablet Commonly known as: TYLENOL Take 3 tablets (975 mg total) by mouth every 8 (eight) hours.   aspirin 81 MG chewable tablet Chew 1 tablet (81 mg total) by mouth 2 (two) times daily for 28 days.   b complex vitamins tablet Take 1 tablet by mouth daily.   celecoxib 200 MG capsule Commonly known as: CELEBREX Take 1 capsule (200 mg total) by mouth 2 (two) times daily.   cetirizine 10 MG tablet Commonly known as: ZYRTEC Take 10 mg by mouth daily as needed for allergies.   cholecalciferol 1000 units tablet Commonly known as: VITAMIN D Take 1,000 Units by mouth daily.   COLLAGEN PO Take 1 Scoop by mouth daily.   docusate sodium 100 MG capsule Commonly known as: COLACE Take 1 capsule (100 mg total) by mouth 2 (two) times daily.   eletriptan 20 MG tablet Commonly known as: RELPAX Take 20 mg by mouth as needed for migraine or headache. May repeat in 2 hours if headache persists or recurs.    escitalopram 20 MG tablet Commonly known as: LEXAPRO Take 20 mg by mouth daily.   esomeprazole 20 MG capsule Commonly known as: NEXIUM Take 20 mg by mouth daily.   hydrocortisone 2.5 % rectal cream Commonly known as: ANUSOL-HC Place 1 application rectally 2 (two) times daily as needed for hemorrhoids or anal itching.   loperamide 2 MG tablet Commonly known as: IMODIUM A-D Take 4 mg by mouth 4 (four) times daily as needed for diarrhea or loose stools.   methocarbamol 500 MG tablet Commonly known as: ROBAXIN Take 1 tablet (500 mg total) by mouth every 6 (six) hours as needed for muscle spasms.   oxyCODONE 5 MG immediate release tablet Commonly known as: Oxy IR/ROXICODONE Take 1-2 tablets (5-10 mg total) by mouth every 6 (six) hours as needed for severe pain.   polyethylene glycol 17 g packet Commonly known as: MIRALAX / GLYCOLAX Take 17  g by mouth daily as needed for mild constipation.   RED YEAST RICE PO Take 3 capsules by mouth daily.       ASK your doctor about these medications    EPINEPHrine 0.3 mg/0.3 mL Soaj injection Commonly known as: EpiPen 2-Pak INJECT 0.3 MLS (0.3 MG TOTAL) INTO THE MUSCLE ONCE.               Discharge Care Instructions  (From admission, onward)           Start     Ordered   04/26/21 0000  Change dressing       Comments: Maintain surgical dressing until follow up in the clinic. If the edges start to pull up, may reinforce with tape. If the dressing is no longer working, may remove and cover with gauze and tape, but must keep the area dry and clean.  Call with any questions or concerns.   04/26/21 0836            Follow-up Information     Maurice March, PA-C. Go on 05/10/2021.   Specialty: Orthopedic Surgery Why: You are scheduled for a follow up appointment on 05-10-21 at 10:15 am. Contact information: [address] [place] [address] [id number]                 Signed: Griffith Citron,  PA-C Orthopedic Surgery 05/10/2021, 1:41 PM

## 2021-05-11 DIAGNOSIS — M25562 Pain in left knee: Secondary | ICD-10-CM | POA: Diagnosis not present

## 2021-05-16 DIAGNOSIS — M25562 Pain in left knee: Secondary | ICD-10-CM | POA: Diagnosis not present

## 2021-05-23 DIAGNOSIS — M25562 Pain in left knee: Secondary | ICD-10-CM | POA: Diagnosis not present

## 2021-05-26 DIAGNOSIS — M25562 Pain in left knee: Secondary | ICD-10-CM | POA: Diagnosis not present

## 2021-05-30 DIAGNOSIS — M25562 Pain in left knee: Secondary | ICD-10-CM | POA: Diagnosis not present

## 2021-06-01 DIAGNOSIS — M25562 Pain in left knee: Secondary | ICD-10-CM | POA: Diagnosis not present

## 2021-06-06 DIAGNOSIS — M25562 Pain in left knee: Secondary | ICD-10-CM | POA: Diagnosis not present

## 2021-06-07 DIAGNOSIS — Z471 Aftercare following joint replacement surgery: Secondary | ICD-10-CM | POA: Diagnosis not present

## 2021-06-07 DIAGNOSIS — Z96652 Presence of left artificial knee joint: Secondary | ICD-10-CM | POA: Diagnosis not present

## 2021-07-17 DIAGNOSIS — L814 Other melanin hyperpigmentation: Secondary | ICD-10-CM | POA: Diagnosis not present

## 2021-07-17 DIAGNOSIS — L570 Actinic keratosis: Secondary | ICD-10-CM | POA: Diagnosis not present

## 2021-07-17 DIAGNOSIS — D692 Other nonthrombocytopenic purpura: Secondary | ICD-10-CM | POA: Diagnosis not present

## 2021-07-17 DIAGNOSIS — L821 Other seborrheic keratosis: Secondary | ICD-10-CM | POA: Diagnosis not present

## 2021-07-17 DIAGNOSIS — L812 Freckles: Secondary | ICD-10-CM | POA: Diagnosis not present

## 2021-07-18 DIAGNOSIS — M25562 Pain in left knee: Secondary | ICD-10-CM | POA: Diagnosis not present

## 2021-07-20 DIAGNOSIS — M25562 Pain in left knee: Secondary | ICD-10-CM | POA: Diagnosis not present

## 2021-07-27 DIAGNOSIS — M25562 Pain in left knee: Secondary | ICD-10-CM | POA: Diagnosis not present

## 2021-08-01 DIAGNOSIS — Z23 Encounter for immunization: Secondary | ICD-10-CM | POA: Diagnosis not present

## 2021-08-04 ENCOUNTER — Ambulatory Visit (INDEPENDENT_AMBULATORY_CARE_PROVIDER_SITE_OTHER): Payer: Medicare Other | Admitting: Internal Medicine

## 2021-08-04 ENCOUNTER — Other Ambulatory Visit: Payer: Self-pay

## 2021-08-04 VITALS — BP 116/74 | HR 86 | Ht 59.0 in | Wt 159.2 lb

## 2021-08-04 DIAGNOSIS — Z136 Encounter for screening for cardiovascular disorders: Secondary | ICD-10-CM | POA: Diagnosis not present

## 2021-08-04 DIAGNOSIS — Z9189 Other specified personal risk factors, not elsewhere classified: Secondary | ICD-10-CM | POA: Diagnosis not present

## 2021-08-04 DIAGNOSIS — Z7189 Other specified counseling: Secondary | ICD-10-CM

## 2021-08-04 DIAGNOSIS — E785 Hyperlipidemia, unspecified: Secondary | ICD-10-CM

## 2021-08-04 NOTE — Progress Notes (Signed)
LIPID CLINIC CONSULT NOTE  Chief Complaint:  Manage dyslipidemia  Primary Care Physician: Midge Minium, MD  Primary Cardiologist:  None  HPI:  Cassandra Little is a 75 y.o. female who is being seen today for the evaluation of dyslipidemia at the request of Megan Salon, MD. this is a pleasant 75 year old female kindly referred for evaluation management of dyslipidemia.  She has very little past medical history and is generally been healthy.  She exercises regularly and eats a fairly healthy diet.  She does have some heart disease in her family including her mom who had some heart failure in her 59s and her father was diabetic and had an MI at around 38 as well.  Recently she has had an increase in her cholesterol.  Total cholesterol in March was 225, HDL 56, LDL 135 and triglycerides 170.  She has not been on therapy for this.  After discussion with her primary doctor, she started on over-the-counter red yeast rice.  There was a recommendation to refer her to the lipid clinic for further risk stratification.  Other risk factors include a history hypertension which is now controlled without medication.  PMHx:  Past Medical History:  Diagnosis Date   Anaphylaxis 02/14/2011   Unknown cause.  Always carries Epipen.   Angioedema    Anxiety    Arthritis    Cough    Depression    Dr. Abner Greenspan   Exposure to hepatitis C    GERD (gastroesophageal reflux disease)    Hyperlipidemia    Hypertension    IBS (irritable bowel syndrome)    Internal and external bleeding hemorrhoids    Migraine    PONV (postoperative nausea and vomiting)    Rosacea     Past Surgical History:  Procedure Laterality Date   ABDOMINOPLASTY  2000   APPENDECTOMY  2004   AUGMENTATION MAMMAPLASTY Bilateral 2011   [place]   x 2   COLONOSCOPY  09/15/2008   internal and external hemorroids   COSMETIC SURGERY     multiple   HEMORRHOID BANDING  2014   REDUCTION MAMMAPLASTY Bilateral  2002   TONSILLECTOMY  1962   TOTAL KNEE ARTHROPLASTY Right 12/26/2015   Procedure: TOTAL KNEE ARTHROPLASTY;  Surgeon: Paralee Cancel, MD;  Location: [facility];  Service: Orthopedics;  Laterality: Right;   TOTAL KNEE ARTHROPLASTY Left 04/25/2021   Procedure: TOTAL KNEE ARTHROPLASTY;  Surgeon: Paralee Cancel, MD;  Location: [facility];  Service: Orthopedics;  Laterality: Left;  70 mins   TOTAL SHOULDER REPLACEMENT Bilateral 2009   right   TOTAL SHOULDER REPLACEMENT     UPPER GASTROINTESTINAL ENDOSCOPY  11/26/2018    FAMHx:  Family History  Problem Relation Age of Onset   Hyperlipidemia Mother    Heart disease Mother    Allergic rhinitis Mother    Eczema Mother    Diabetes Father    Hypertension Father    Heart attack Father        45s   Heart disease Father    Obesity Sister    Hypertension Sister    Hyperlipidemia Sister    Colon cancer Neg Hx    Stomach cancer Neg Hx    Rectal cancer Neg Hx    Pancreatic cancer Neg Hx    Esophageal cancer Neg Hx    Angioedema Neg Hx    Asthma Neg Hx    Atopy Neg Hx    Immunodeficiency Neg Hx    Urticaria Neg  Hx     SOCHx:   reports that she has never smoked. She has never used smokeless tobacco. She reports current alcohol use of about 1.0 standard drink per week. She reports that she does not use drugs.  ALLERGIES:  Allergies  Allergen Reactions   Ace Inhibitors Cough    ROS: Pertinent items noted in HPI and remainder of comprehensive ROS otherwise negative.  HOME MEDS: Current Outpatient Medications on File Prior to Visit  Medication Sig Dispense Refill   acetaminophen (TYLENOL) 325 MG tablet Take 3 tablets (975 mg total) by mouth every 8 (eight) hours.     ALPRAZolam (XANAX) 0.5 MG tablet alprazolam 0.5 mg tablet  TAKE 1 TABLET BY MOUTH EVERY DAY AS NEEDED     b complex vitamins tablet Take 1 tablet by mouth daily.     cetirizine (ZYRTEC) 10 MG tablet Take 10 mg by mouth daily as needed for allergies.     cholecalciferol (VITAMIN D)  1000 units tablet Take 1,000 Units by mouth daily.     COLLAGEN PO Take 1 Scoop by mouth daily.     eletriptan (RELPAX) 20 MG tablet Take 20 mg by mouth as needed for migraine or headache. May repeat in 2 hours if headache persists or recurs.     EPINEPHrine (EPIPEN 2-PAK) 0.3 mg/0.3 mL IJ SOAJ injection INJECT 0.3 MLS (0.3 MG TOTAL) INTO THE MUSCLE ONCE. (Patient taking differently: Inject 0.3 mg into the muscle as needed for anaphylaxis.) 2 each 1   escitalopram (LEXAPRO) 20 MG tablet Take 20 mg by mouth daily.     esomeprazole (NEXIUM) 20 MG capsule Take 20 mg by mouth daily.     hydrocortisone (ANUSOL-HC) 2.5 % rectal cream Place 1 application rectally 2 (two) times daily as needed for hemorrhoids or anal itching.     Red Yeast Rice Extract (RED YEAST RICE PO) Take 3 capsules by mouth daily.     No current facility-administered medications on file prior to visit.    LABS/IMAGING: No results found for this or any previous visit (from the past 48 hour(s)). No results found.  LIPID PANEL:    Component Value Date/Time   CHOL 225 (H) 12/27/2020 1307   CHOL 195 08/15/2018 1709   TRIG 170.0 (H) 12/27/2020 1307   HDL 56.00 12/27/2020 1307   HDL 54 08/15/2018 1709   CHOLHDL 4 12/27/2020 1307   VLDL 34.0 12/27/2020 1307   LDLCALC 135 (H) 12/27/2020 1307   LDLCALC 120 (H) 08/15/2018 1709   LDLDIRECT 146.0 11/09/2015 1351    WEIGHTS: Wt Readings from Last 3 Encounters:  08/04/21 159 lb 3.2 oz (72.2 kg)  04/25/21 153 lb (69.4 kg)  04/12/21 153 lb (69.4 kg)    VITALS: BP 116/74   Pulse 86   Ht 4\' 11"  (1.499 m)   Wt 159 lb 3.2 oz (72.2 kg)   SpO2 98%   BMI 32.15 kg/m   EXAM: General appearance: alert and no distress Neck: no carotid bruit, no JVD, and thyroid not enlarged, symmetric, no tenderness/mass/nodules Lungs: clear to auscultation bilaterally Heart: regular rate and rhythm, S1, S2 normal, no murmur, click, rub or gallop Abdomen: soft, non-tender; bowel sounds normal;  no masses,  no organomegaly Extremities: extremities normal, atraumatic, no cyanosis or edema Pulses: 2+ and symmetric Skin: Skin color, texture, turgor normal. No rashes or lesions Neurologic: Grossly normal Psych: Pleasant  *Examination chaperoned by Sheral Apley, RN.  EKG: Deferred  ASSESSMENT: Mixed dyslipidemia 10-year ASCVD risk of 11.1% by  pooled cohort equation Family history of later onset coronary disease/CHF in both parents  PLAN: 1.   Ms. Campise has an intermediate risk of ASCVD by pooled cohort equation's.  There was later onset coronary disease in her father but he was diabetic and she is not.  She has a mixed dyslipidemia which is partially dietary.  She has been working on that and stays active.  I think would be better to risk ratified her using a coronary calcium score.  She is agreeable to that.  She was recently started on red yeast rice back in March.  We will go and repeat a lipid NMR to assess the effects of this.  Based on traditional risk scoring, moderate intensity statin therapy is recommended, however, this may be adjusted with her calcium score results.  Thanks again for the kind referral.  Pixie Casino, MD, FACC, [place] Director of the [facility]  [facility] Diplomate of the American Board of Clinical Lipidology Attending Cardiologist  Direct Dial: [phone]  Fax: [phone]  Website:  [email or web address] [email or web address] Nelma Phagan 08/04/2021, 8:52 AM

## 2021-08-04 NOTE — Patient Instructions (Signed)
Medication Instructions:  NO CHANGES  *If you need a refill on your cardiac medications before your next appointment, please call your pharmacy*   Lab Work: FASTING lab work to check cholesterol   If you have labs (blood work) drawn today and your tests are completely normal, you will receive your results only by: [place] (if you have MyChart) OR A paper copy in the mail If you have any lab test that is abnormal or we need to change your treatment, we will call you to review the results.   Testing/Procedures: Calcium Score @ [facility]   Follow-Up: At [facility], you and your health needs are our priority.  As part of our continuing mission to provide you with exceptional heart care, we have created designated Provider Care Teams.  These Care Teams include your primary Cardiologist (physician) and Advanced Practice Providers (APPs -  Physician Assistants and Nurse Practitioners) who all work together to provide you with the care you need, when you need it.  We recommend signing up for the patient portal called "MyChart".  Sign up information is provided on this After Visit Summary.  MyChart is used to connect with patients for Virtual Visits (Telemedicine).  Patients are able to view lab/test results, encounter notes, upcoming appointments, etc.  Non-urgent messages can be sent to your provider as well.   To learn more about what you can do with MyChart, go to [email or web address].    Your next appointment:   AS NEEDED -- based on lab results

## 2021-08-08 ENCOUNTER — Ambulatory Visit (HOSPITAL_BASED_OUTPATIENT_CLINIC_OR_DEPARTMENT_OTHER)
Admission: RE | Admit: 2021-08-08 | Discharge: 2021-08-08 | Disposition: A | Discharge status: Home / Self Care | Payer: Medicare Other | Source: Ambulatory Visit | Attending: Internal Medicine | Admitting: Internal Medicine

## 2021-08-08 ENCOUNTER — Other Ambulatory Visit: Payer: Self-pay

## 2021-08-08 DIAGNOSIS — E785 Hyperlipidemia, unspecified: Secondary | ICD-10-CM | POA: Insufficient documentation

## 2021-08-08 DIAGNOSIS — Z136 Encounter for screening for cardiovascular disorders: Secondary | ICD-10-CM | POA: Insufficient documentation

## 2021-08-10 LAB — NMR, LIPOPROFILE
Cholesterol, Total: 229 mg/dL — ABNORMAL HIGH (ref 100–199)
HDL Particle Number: 32.3 umol/L (ref >=30.5)
HDL-C: 54 mg/dL (ref >39)
LDL Particle Number: 1619 nmol/L — ABNORMAL HIGH (ref <1000)
LDL Size: 21.7 nm (ref >20.5)
LDL-C (NIH Calc): 155 mg/dL — ABNORMAL HIGH (ref 0–99)
LP-IR Score: 27 (ref <=45)
Small LDL Particle Number: <90 nmol/L (ref <=527)
Triglycerides: 112 mg/dL (ref 0–149)

## 2021-08-11 ENCOUNTER — Other Ambulatory Visit: Payer: Self-pay | Admitting: *Deleted

## 2021-08-11 DIAGNOSIS — E785 Hyperlipidemia, unspecified: Secondary | ICD-10-CM

## 2021-08-14 MED ORDER — ROSUVASTATIN CALCIUM 20 MG PO TABS: 20.0000 mg | ORAL_TABLET | Freq: Every day | ORAL | 3 refills | Status: DC

## 2021-12-05 LAB — NMR, LIPOPROFILE
Cholesterol, Total: 187 mg/dL (ref 100–199)
HDL Particle Number: 43.6 umol/L (ref >=30.5)
HDL-C: 66 mg/dL (ref >39)
LDL Particle Number: 1289 nmol/L — ABNORMAL HIGH (ref <1000)
LDL Size: 21.1 nm (ref >20.5)
LDL-C (NIH Calc): 106 mg/dL — ABNORMAL HIGH (ref 0–99)
LP-IR Score: 42 (ref <=45)
Small LDL Particle Number: 546 nmol/L — ABNORMAL HIGH (ref <=527)
Triglycerides: 85 mg/dL (ref 0–149)

## 2021-12-06 ENCOUNTER — Other Ambulatory Visit: Payer: Self-pay

## 2021-12-06 ENCOUNTER — Encounter: Payer: Self-pay | Admitting: Internal Medicine

## 2021-12-06 ENCOUNTER — Ambulatory Visit: Payer: Medicare Other | Admitting: Internal Medicine

## 2021-12-06 ENCOUNTER — Telehealth: Payer: Self-pay | Admitting: Internal Medicine

## 2021-12-06 VITALS — BP 119/82 | HR 77 | Ht 60.0 in | Wt 163.6 lb

## 2021-12-06 DIAGNOSIS — R931 Abnormal findings on diagnostic imaging of heart and coronary circulation: Secondary | ICD-10-CM | POA: Diagnosis not present

## 2021-12-06 DIAGNOSIS — E785 Hyperlipidemia, unspecified: Secondary | ICD-10-CM

## 2021-12-06 DIAGNOSIS — I2510 Atherosclerotic heart disease of native coronary artery without angina pectoris: Secondary | ICD-10-CM

## 2021-12-06 NOTE — Telephone Encounter (Signed)
Patient seen in office 12/06/21 - LP(a) was recommended to be added on to NMR done on 12/04/21 Call made to [facility] and could NOT be added on LP(a) ordered Left message for patient with update on this

## 2021-12-06 NOTE — Progress Notes (Signed)
LIPID CLINIC CONSULT NOTE  Chief Complaint:  Follow-up dyslipidemia  Primary Care Physician: Midge Minium, MD  Primary Cardiologist:  None  HPI:  Cassandra Little is a 76 y.o. female who is being seen today for the evaluation of dyslipidemia at the request of Birdie Riddle, Aundra Millet, MD. this is a pleasant 76 year old female kindly referred for evaluation management of dyslipidemia.  She has very little past medical history and is generally been healthy.  She exercises regularly and eats a fairly healthy diet.  She does have some heart disease in her family including her mom who had some heart failure in her 73s and her father was diabetic and had an MI at around 79 as well.  Recently she has had an increase in her cholesterol.  Total cholesterol in March was 225, HDL 56, LDL 135 and triglycerides 170.  She has not been on therapy for this.  After discussion with her primary doctor, she started on over-the-counter red yeast rice.  There was a recommendation to refer her to the lipid clinic for further risk stratification.  Other risk factors include a history hypertension which is now controlled without medication.  12/06/2021  Cassandra Little returns today for follow-up of her lipids.  She underwent calcium scoring which demonstrated an elevated calcium score 59.8, 57 percentile for age and sex matched controls.  Based on this she was started on rosuvastatin 20 mg daily for 50% reduction of her lipids however did not achieve this.  Particle numbers went to 1289 from 1619.  LDL now 106 down from 155.  Total cholesterol was 299 down to 187.  This represents less than 50% reduction which would be expected for the medication.  She says that she has been compliant with the medicine.  She also notes no significant changes in her diet, weight gain or less physical activity that could explain this.  Based on that I am concerned there may be an elevation in LP(a) to explain this.  PMHx:  Past Medical  History:  Diagnosis Date   Anaphylaxis 02/14/2011   Unknown cause.  Always carries Epipen.   Angioedema    Anxiety    Arthritis    Cough    Depression    Dr. Abner Greenspan   Exposure to hepatitis C    GERD (gastroesophageal reflux disease)    Hyperlipidemia    Hypertension    IBS (irritable bowel syndrome)    Internal and external bleeding hemorrhoids    Migraine    PONV (postoperative nausea and vomiting)    Rosacea     Past Surgical History:  Procedure Laterality Date   ABDOMINOPLASTY  2000   APPENDECTOMY  2004   AUGMENTATION MAMMAPLASTY Bilateral 2011   [place]   x 2   COLONOSCOPY  09/15/2008   internal and external hemorroids   COSMETIC SURGERY     multiple   HEMORRHOID BANDING  2014   REDUCTION MAMMAPLASTY Bilateral 2002   TONSILLECTOMY  1962   TOTAL KNEE ARTHROPLASTY Right 12/26/2015   Procedure: TOTAL KNEE ARTHROPLASTY;  Surgeon: Paralee Cancel, MD;  Location: [facility];  Service: Orthopedics;  Laterality: Right;   TOTAL KNEE ARTHROPLASTY Left 04/25/2021   Procedure: TOTAL KNEE ARTHROPLASTY;  Surgeon: Paralee Cancel, MD;  Location: [facility];  Service: Orthopedics;  Laterality: Left;  70 mins   TOTAL SHOULDER REPLACEMENT Bilateral 2009   right   TOTAL SHOULDER REPLACEMENT     UPPER GASTROINTESTINAL ENDOSCOPY  11/26/2018  FAMHx:  Family History  Problem Relation Age of Onset   Hyperlipidemia Mother    Heart disease Mother    Allergic rhinitis Mother    Eczema Mother    Diabetes Father    Hypertension Father    Heart attack Father        69s   Heart disease Father    Obesity Sister    Hypertension Sister    Hyperlipidemia Sister    Colon cancer Neg Hx    Stomach cancer Neg Hx    Rectal cancer Neg Hx    Pancreatic cancer Neg Hx    Esophageal cancer Neg Hx    Angioedema Neg Hx    Asthma Neg Hx    Atopy Neg Hx    Immunodeficiency Neg Hx    Urticaria Neg Hx     SOCHx:   reports that she has never smoked. She has never used smokeless  tobacco. She reports current alcohol use of about 1.0 standard drink per week. She reports that she does not use drugs.  ALLERGIES:  Allergies  Allergen Reactions   Ace Inhibitors Cough    ROS: Pertinent items noted in HPI and remainder of comprehensive ROS otherwise negative.  HOME MEDS: Current Outpatient Medications on File Prior to Visit  Medication Sig Dispense Refill   acetaminophen (TYLENOL) 325 MG tablet Take 3 tablets (975 mg total) by mouth every 8 (eight) hours.     ALPRAZolam (XANAX) 0.5 MG tablet alprazolam 0.5 mg tablet  TAKE 1 TABLET BY MOUTH EVERY DAY AS NEEDED     amoxicillin (AMOXIL) 500 MG capsule amoxicillin 500 mg capsule  TAKE 4 CAPSULES BY MOUTH 1 HOUR PRIOR TO DENTAL WORK     b complex vitamins tablet Take 1 tablet by mouth daily.     cetirizine (ZYRTEC) 10 MG tablet Take 10 mg by mouth daily as needed for allergies.     cholecalciferol (VITAMIN D) 1000 units tablet Take 1,000 Units by mouth daily.     COLLAGEN PO Take 1 Scoop by mouth daily.     eletriptan (RELPAX) 20 MG tablet Take 20 mg by mouth as needed for migraine or headache. May repeat in 2 hours if headache persists or recurs.     EPINEPHrine (EPIPEN 2-PAK) 0.3 mg/0.3 mL IJ SOAJ injection INJECT 0.3 MLS (0.3 MG TOTAL) INTO THE MUSCLE ONCE. (Patient taking differently: Inject 0.3 mg into the muscle as needed for anaphylaxis.) 2 each 1   escitalopram (LEXAPRO) 20 MG tablet Take 20 mg by mouth daily.     esomeprazole (NEXIUM) 20 MG capsule Take 20 mg by mouth daily.     hydrocortisone (ANUSOL-HC) 2.5 % rectal cream Place 1 application rectally 2 (two) times daily as needed for hemorrhoids or anal itching.     rosuvastatin (CRESTOR) 20 MG tablet Take 1 tablet (20 mg total) by mouth daily. 90 tablet 3   No current facility-administered medications on file prior to visit.    LABS/IMAGING: No results found for this or any previous visit (from the past 48 hour(s)). No results found.  LIPID PANEL:     Component Value Date/Time   CHOL 225 (H) 12/27/2020 1307   CHOL 195 08/15/2018 1709   TRIG 170.0 (H) 12/27/2020 1307   HDL 56.00 12/27/2020 1307   HDL 54 08/15/2018 1709   CHOLHDL 4 12/27/2020 1307   VLDL 34.0 12/27/2020 1307   LDLCALC 135 (H) 12/27/2020 1307   LDLCALC 120 (H) 08/15/2018 1709   LDLDIRECT 146.0 11/09/2015  1351    WEIGHTS: Wt Readings from Last 3 Encounters:  12/06/21 163 lb 9.6 oz (74.2 kg)  08/04/21 159 lb 3.2 oz (72.2 kg)  04/25/21 153 lb (69.4 kg)    VITALS: BP 119/82    Pulse 77    Ht 5' (1.524 m)    Wt 163 lb 9.6 oz (74.2 kg)    SpO2 99%    BMI 31.95 kg/m   EXAM: Deferred  EKG: Deferred  ASSESSMENT: Mixed dyslipidemia 10-year ASCVD risk of 11.1% by pooled cohort equation Family history of later onset coronary disease/CHF in both parents Elevated CAC score of 59.8, 57th percentile (07/2021)  PLAN: 1.   Cassandra Little had less than expected response on a statin and is still well above target LDL less than 70.  I like to add a LP(a) if possible or retested as this will guide additional therapies.  If her LP(a) is high then she would likely benefit most from a PCSK9 inhibitor.  If it is low then she may have had LDL receptor mutation or something that explains why she had less than expected response to Crestor and we could consider adding ezetimibe.  Plan follow-up with me and repeat lipids in 3 to 4 months.  Pixie Casino, MD, [facility], [place] Director of the Advanced Lipid Disorders &  Cardiovascular Risk Reduction Clinic Diplomate of the American Board of Clinical Lipidology Attending Cardiologist  Direct Dial: [phone]   Fax: [phone]  Website:  [email or web address] 12/06/2021, 9:38 AM

## 2021-12-06 NOTE — Patient Instructions (Signed)
Medication Instructions:  Medication changes will depend on LP(a) test which we will try to get added on to your last set of labs   *If you need a refill on your cardiac medications before your next appointment, please call your pharmacy*   Lab Work: FASTING lab work to check cholesterol in about 4 months -- about 1 week before your next visit with Dr. Debara Pickett   If you have labs (blood work) drawn today and your tests are completely normal, you will receive your results only by: [place] (if you have MyChart) OR A paper copy in the mail If you have any lab test that is abnormal or we need to change your treatment, we will call you to review the results.  Follow-Up: At [facility], you and your health needs are our priority.  As part of our continuing mission to provide you with exceptional heart care, we have created designated Provider Care Teams.  These Care Teams include your primary Cardiologist (physician) and Advanced Practice Providers (APPs -  Physician Assistants and Nurse Practitioners) who all work together to provide you with the care you need, when you need it.  We recommend signing up for the patient portal called "MyChart".  Sign up information is provided on this After Visit Summary.  MyChart is used to connect with patients for Virtual Visits (Telemedicine).  Patients are able to view lab/test results, encounter notes, upcoming appointments, etc.  Non-urgent messages can be sent to your provider as well.   To learn more about what you can do with MyChart, go to [email or web address].    Your next appointment:    4 months with Dr. Debara Pickett -- lipid clinic

## 2021-12-06 NOTE — Addendum Note (Signed)
Addended by: Fidel Levy on: 12/06/2021 10:13 AM   Modules accepted: Orders

## 2021-12-14 LAB — LIPOPROTEIN A (LPA): Lipoprotein (a): <8.4 nmol/L (ref <75.0)

## 2021-12-18 ENCOUNTER — Telehealth: Payer: Self-pay | Admitting: Internal Medicine

## 2021-12-18 ENCOUNTER — Other Ambulatory Visit: Payer: Self-pay | Admitting: *Deleted

## 2021-12-18 DIAGNOSIS — E785 Hyperlipidemia, unspecified: Secondary | ICD-10-CM

## 2021-12-18 MED ORDER — EZETIMIBE 10 MG PO TABS: 10.0000 mg | ORAL_TABLET | Freq: Every day | ORAL | 3 refills | Status: DC

## 2021-12-18 NOTE — Telephone Encounter (Signed)
Patient states when she was seen she was told her has heart disease. She would someone to like explain what heart disease is and what it means for her.  ?

## 2021-12-18 NOTE — Telephone Encounter (Signed)
Per calcium score test results: ? Pixie Casino, MD  ?08/09/2021  2:43 PM EDT   ?  ?Calcium score of 59.8 (57th percentile). Aortic arch atherosclerosis  ? ?Spoke with patient of Dr. Debara Pickett. Explained that CAC score bears clinical dx of ASCVD. Explained that her score is moderate for ages/sex-matched controls. Explained that risk factor modification is key - such as lipid management (BP, diabetes - but she does not have these health issues). Advised that zetia can give her more lipid lowering potential than crestor dose increase alone, but statin dose can be increased if needed, if not to target LDL on repeat labs, which is less than 70. Advised if she has room to make dietary modifications, she should work on that. Explained that LP(a) was reassuring/negative.  ? ?Patient had no additional questions at end of call.  ?

## 2021-12-21 ENCOUNTER — Other Ambulatory Visit: Payer: Self-pay

## 2021-12-21 MED ORDER — EZETIMIBE 10 MG PO TABS: 10.0000 mg | ORAL_TABLET | Freq: Every day | ORAL | 3 refills | Status: DC

## 2021-12-21 MED ORDER — ROSUVASTATIN CALCIUM 20 MG PO TABS: 20.0000 mg | ORAL_TABLET | Freq: Every day | ORAL | 3 refills | Status: DC

## 2022-03-22 ENCOUNTER — Ambulatory Visit: Payer: Medicare Other

## 2022-04-06 LAB — NMR, LIPOPROFILE
Cholesterol, Total: 112 mg/dL (ref 100–199)
HDL Particle Number: 41.3 umol/L (ref >=30.5)
HDL-C: 61 mg/dL (ref >39)
LDL Particle Number: 618 nmol/L (ref <1000)
LDL Size: 19.9 nm — ABNORMAL LOW (ref >20.5)
LDL-C (NIH Calc): 38 mg/dL (ref 0–99)
LP-IR Score: 37 (ref <=45)
Small LDL Particle Number: 420 nmol/L (ref <=527)
Triglycerides: 55 mg/dL (ref 0–149)

## 2022-04-13 ENCOUNTER — Encounter (HOSPITAL_BASED_OUTPATIENT_CLINIC_OR_DEPARTMENT_OTHER): Payer: Self-pay | Admitting: Internal Medicine

## 2022-04-13 ENCOUNTER — Ambulatory Visit (HOSPITAL_BASED_OUTPATIENT_CLINIC_OR_DEPARTMENT_OTHER): Payer: Medicare Other | Admitting: Internal Medicine

## 2022-04-13 VITALS — BP 126/78 | HR 77 | Ht 60.0 in | Wt 162.1 lb

## 2022-04-13 DIAGNOSIS — R931 Abnormal findings on diagnostic imaging of heart and coronary circulation: Secondary | ICD-10-CM

## 2022-04-13 DIAGNOSIS — E785 Hyperlipidemia, unspecified: Secondary | ICD-10-CM | POA: Diagnosis not present

## 2022-04-13 NOTE — Patient Instructions (Addendum)
Medication Instructions:  Your physician recommends that you continue on your current medications as directed. Please refer to the Current Medication list given to you today.  *If you need a refill on your cardiac medications before your next appointment, please call your pharmacy*   Lab Work: FASTING lab work to check cholesterol in 6 months  If you have labs (blood work) drawn today and your tests are completely normal, you will receive your results only by: [place] (if you have MyChart) OR A paper copy in the mail If you have any lab test that is abnormal or we need to change your treatment, we will call you to review the results.   Testing/Procedures: NONE   Follow-Up: At [facility], you and your health needs are our priority.  As part of our continuing mission to provide you with exceptional heart care, we have created designated Provider Care Teams.  These Care Teams include your primary Cardiologist (physician) and Advanced Practice Providers (APPs -  Physician Assistants and Nurse Practitioners) who all work together to provide you with the care you need, when you need it.  We recommend signing up for the patient portal called "MyChart".  Sign up information is provided on this After Visit Summary.  MyChart is used to connect with patients for Virtual Visits (Telemedicine).  Patients are able to view lab/test results, encounter notes, upcoming appointments, etc.  Non-urgent messages can be sent to your provider as well.   To learn more about what you can do with MyChart, go to [email or web address].    Your next appointment:   6 month(s)  The format for your next appointment:   In Person  Provider:   Lyman Bishop MD - lipid clinic  ** Call in August for a late December/early January 2024 appointment   Primary Care: [facility] [place], [place] - Lamar Blinks, [place], [place], [place] at [facility]

## 2022-04-13 NOTE — Progress Notes (Signed)
LIPID CLINIC CONSULT NOTE  Chief Complaint:  Follow-up dyslipidemia  Primary Care Physician: Cassandra Minium, MD  Primary Cardiologist:  None  HPI:  Cassandra Little is a 76 y.o. female who is being seen today for the evaluation of dyslipidemia at the request of Cassandra Little, Cassandra Millet, MD. this is a pleasant 76 year old female kindly referred for evaluation management of dyslipidemia.  She has very Little past medical history and is generally been healthy.  She exercises regularly and eats a fairly healthy diet.  She does have some heart disease in her family including her mom who had some heart failure in her 32s and her father was diabetic and had an MI at around 89 as well.  Recently she has had an increase in her cholesterol.  Total cholesterol in March was 225, HDL 56, LDL 135 and triglycerides 170.  She has not been on therapy for this.  After discussion with her primary doctor, she started on over-the-counter red yeast rice.  There was a recommendation to refer her to the lipid clinic for further risk stratification.  Other risk factors include a history hypertension which is now controlled without medication.  12/06/2021  Cassandra Little returns today for follow-up of her lipids.  She underwent calcium scoring which demonstrated an elevated calcium score 59.8, 57 percentile for age and sex matched controls.  Based on this she was started on rosuvastatin 20 mg daily for 50% reduction of her lipids however did not achieve this.  Particle numbers went to 1289 from 1619.  LDL now 106 down from 155.  Total cholesterol was 299 down to 187.  This represents less than 50% reduction which would be expected for the medication.  She says that she has been compliant with the medicine.  She also notes no significant changes in her diet, weight gain or less physical activity that could explain this.  Based on that I am concerned there may be an elevation in LP(a) to explain this.  04/13/2022  Mrs.  Little returns today for follow-up.  She is done well on combination of ezetimibe and rosuvastatin.  Her cholesterol profile is markedly improved.  Total cholesterol now 112, triglycerides 55, HDL 61 and LDL 38.  LDL particle number is 618, small LDL particle number is 420.  Overall significant improvement in her lipids.  PMHx:  Past Medical History:  Diagnosis Date   Anaphylaxis 02/14/2011   Unknown cause.  Always carries Epipen.   Angioedema    Anxiety    Arthritis    Cough    Depression    Dr. Abner Greenspan   Exposure to hepatitis C    GERD (gastroesophageal reflux disease)    Hyperlipidemia    Hypertension    IBS (irritable bowel syndrome)    Internal and external bleeding hemorrhoids    Migraine    PONV (postoperative nausea and vomiting)    Rosacea     Past Surgical History:  Procedure Laterality Date   ABDOMINOPLASTY  2000   APPENDECTOMY  2004   AUGMENTATION MAMMAPLASTY Bilateral 2011   [place]   x 2   COLONOSCOPY  09/15/2008   internal and external hemorroids   COSMETIC SURGERY     multiple   HEMORRHOID BANDING  2014   REDUCTION MAMMAPLASTY Bilateral 2002   TONSILLECTOMY  1962   TOTAL KNEE ARTHROPLASTY Right 12/26/2015   Procedure: TOTAL KNEE ARTHROPLASTY;  Surgeon: Paralee Cancel, MD;  Location: [facility];  Service: Orthopedics;  Laterality:  Right;   TOTAL KNEE ARTHROPLASTY Left 04/25/2021   Procedure: TOTAL KNEE ARTHROPLASTY;  Surgeon: Paralee Cancel, MD;  Location: [facility];  Service: Orthopedics;  Laterality: Left;  70 mins   TOTAL SHOULDER REPLACEMENT Bilateral 2009   right   TOTAL SHOULDER REPLACEMENT     UPPER GASTROINTESTINAL ENDOSCOPY  11/26/2018    FAMHx:  Family History  Problem Relation Age of Onset   Hyperlipidemia Mother    Heart disease Mother    Allergic rhinitis Mother    Eczema Mother    Diabetes Father    Hypertension Father    Heart attack Father        38s   Heart disease Father    Obesity Sister    Hypertension Sister     Hyperlipidemia Sister    Colon cancer Neg Hx    Stomach cancer Neg Hx    Rectal cancer Neg Hx    Pancreatic cancer Neg Hx    Esophageal cancer Neg Hx    Angioedema Neg Hx    Asthma Neg Hx    Atopy Neg Hx    Immunodeficiency Neg Hx    Urticaria Neg Hx     SOCHx:   reports that she has never smoked. She has never used smokeless tobacco. She reports current alcohol use of about 1.0 standard drink of alcohol per week. She reports that she does not use drugs.  ALLERGIES:  Allergies  Allergen Reactions   Ace Inhibitors Cough    ROS: Pertinent items noted in HPI and remainder of comprehensive ROS otherwise negative.  HOME MEDS: Current Outpatient Medications on File Prior to Visit  Medication Sig Dispense Refill   acetaminophen (TYLENOL) 325 MG tablet Take 3 tablets (975 mg total) by mouth every 8 (eight) hours. (Patient taking differently: Take 1,000 mg by mouth as needed.)     ALPRAZolam (XANAX) 0.5 MG tablet alprazolam 0.5 mg tablet  TAKE 1 TABLET BY MOUTH EVERY DAY AS NEEDED     amoxicillin (AMOXIL) 500 MG capsule amoxicillin 500 mg capsule  TAKE 4 CAPSULES BY MOUTH 1 HOUR PRIOR TO DENTAL WORK     b complex vitamins tablet Take 1 tablet by mouth daily.     cetirizine (ZYRTEC) 10 MG tablet Take 10 mg by mouth daily as needed for allergies.     cholecalciferol (VITAMIN D) 1000 units tablet Take 1,000 Units by mouth daily.     COLLAGEN PO Take 1 Scoop by mouth daily.     cycloSPORINE (RESTASIS) 0.05 % ophthalmic emulsion      eletriptan (RELPAX) 20 MG tablet Take 20 mg by mouth as needed for migraine or headache. May repeat in 2 hours if headache persists or recurs.     EPINEPHrine (EPIPEN 2-PAK) 0.3 mg/0.3 mL IJ SOAJ injection INJECT 0.3 MLS (0.3 MG TOTAL) INTO THE MUSCLE ONCE. (Patient taking differently: Inject 0.3 mg into the muscle as needed for anaphylaxis.) 2 each 1   escitalopram (LEXAPRO) 20 MG tablet Take 20 mg by mouth daily.     esomeprazole (NEXIUM) 20 MG capsule Take  20 mg by mouth daily.     ezetimibe (ZETIA) 10 MG tablet Take 1 tablet (10 mg total) by mouth daily. 90 tablet 3   hydrocortisone (ANUSOL-HC) 2.5 % rectal cream Place 1 application rectally 2 (two) times daily as needed for hemorrhoids or anal itching.     rosuvastatin (CRESTOR) 20 MG tablet Take 1 tablet (20 mg total) by mouth daily. 90 tablet 3   No  current facility-administered medications on file prior to visit.    LABS/IMAGING: No results found for this or any previous visit (from the past 48 hour(s)). No results found.  LIPID PANEL:    Component Value Date/Time   CHOL 225 (H) 12/27/2020 1307   CHOL 195 08/15/2018 1709   TRIG 170.0 (H) 12/27/2020 1307   HDL 56.00 12/27/2020 1307   HDL 54 08/15/2018 1709   CHOLHDL 4 12/27/2020 1307   VLDL 34.0 12/27/2020 1307   LDLCALC 135 (H) 12/27/2020 1307   LDLCALC 120 (H) 08/15/2018 1709   LDLDIRECT 146.0 11/09/2015 1351    WEIGHTS: Wt Readings from Last 3 Encounters:  04/13/22 162 lb 1.6 oz (73.5 kg)  12/06/21 163 lb 9.6 oz (74.2 kg)  08/04/21 159 lb 3.2 oz (72.2 kg)    VITALS: BP 126/78   Pulse 77   Ht 5' (1.524 m)   Wt 162 lb 1.6 oz (73.5 kg)   SpO2 97%   BMI 31.66 kg/m   EXAM: Deferred  EKG: Deferred  ASSESSMENT: Mixed dyslipidemia 10-year ASCVD risk of 11.1% by pooled cohort equation Family history of later onset coronary disease/CHF in both parents Elevated CAC score of 59.8, 57th percentile (07/2021)  PLAN: 1.   Cassandra Little has finally achieved the targets of her cholesterol management.  She seems to be tolerating this combination of therapy very well.  She is exercising regularly and eating a healthy diet.  I would encourage her to continue with this.  We will plan to repeat lipid NMR in about 6 months.  She is in need of a new primary care provider and we will provide some recommendations today.  Follow-up afterwards.  Pixie Casino, MD, [facility] [place] Director of the  Advanced Lipid Disorders &  Cardiovascular Risk Reduction Clinic Diplomate of the American Board of Clinical Lipidology Attending Cardiologist  Direct Dial: [phone]  Fax: [phone]  Website:  [email or web address] 04/13/2022, 8:52 AM

## 2022-05-17 ENCOUNTER — Telehealth: Payer: Self-pay

## 2022-05-17 NOTE — Telephone Encounter (Signed)
Patient states she would like a recommendation for a New PCP. She states she is near [place] and the [place] area , but doesn't want to see a [employer] provider. Please advise

## 2022-05-18 NOTE — Telephone Encounter (Signed)
Left pt a VM stating DR Birdie Riddle message

## 2022-05-18 NOTE — Telephone Encounter (Signed)
If she wants to see a [facility] provider, she should look into who is taking new pt's at [facility] on [address].  They are all wonderful

## 2022-05-30 ENCOUNTER — Telehealth: Payer: Self-pay | Admitting: Family Medicine

## 2022-05-30 NOTE — Telephone Encounter (Signed)
Yes, no problem.  Please schedule a office visit at her convenience

## 2022-05-30 NOTE — Telephone Encounter (Signed)
Please advise 

## 2022-05-30 NOTE — Telephone Encounter (Signed)
It is fine for her to transfer, but only if he accepts.  I wish her my best!

## 2022-05-30 NOTE — Telephone Encounter (Signed)
Okay to schedule NP appt- please make her aware that he will be out of town in the next few weeks.

## 2022-05-30 NOTE — Telephone Encounter (Signed)
Pt called stating she would like to establish care with Dr. Larose Kells as he is closer to home for her. Advised that Dr. Larose Kells was not accepting new pts and a note would have to be sent back for approval.   Is a TOC ok for this pt?

## 2022-07-09 ENCOUNTER — Ambulatory Visit: Payer: Medicare Other | Admitting: Internal Medicine

## 2022-07-09 ENCOUNTER — Encounter: Payer: Self-pay | Admitting: Internal Medicine

## 2022-07-09 VITALS — BP 126/78 | HR 88 | Temp 98.7°F | Resp 16 | Ht 60.0 in | Wt 162.2 lb

## 2022-07-09 DIAGNOSIS — E785 Hyperlipidemia, unspecified: Secondary | ICD-10-CM

## 2022-07-09 DIAGNOSIS — R5383 Other fatigue: Secondary | ICD-10-CM | POA: Diagnosis not present

## 2022-07-09 DIAGNOSIS — Z862 Personal history of diseases of the blood and blood-forming organs and certain disorders involving the immune mechanism: Secondary | ICD-10-CM | POA: Diagnosis not present

## 2022-07-09 DIAGNOSIS — F32A Depression, unspecified: Secondary | ICD-10-CM | POA: Diagnosis not present

## 2022-07-09 DIAGNOSIS — Z23 Encounter for immunization: Secondary | ICD-10-CM

## 2022-07-09 DIAGNOSIS — R739 Hyperglycemia, unspecified: Secondary | ICD-10-CM | POA: Diagnosis not present

## 2022-07-09 DIAGNOSIS — E559 Vitamin D deficiency, unspecified: Secondary | ICD-10-CM

## 2022-07-09 LAB — COMPREHENSIVE METABOLIC PANEL
ALT: 24 U/L (ref 0–35)
AST: 21 U/L (ref 0–37)
Albumin: 4.3 g/dL (ref 3.5–5.2)
Alkaline Phosphatase: 63 U/L (ref 39–117)
BUN: 27 mg/dL — ABNORMAL HIGH (ref 6–23)
CO2: 28 mEq/L (ref 19–32)
Calcium: 9.8 mg/dL (ref 8.4–10.5)
Chloride: 104 mEq/L (ref 96–112)
Creatinine, Ser: 0.92 mg/dL (ref 0.40–1.20)
GFR: 61.51 mL/min (ref >60.00)
Glucose, Bld: 88 mg/dL (ref 70–99)
Potassium: 4.7 mEq/L (ref 3.5–5.1)
Sodium: 142 mEq/L (ref 135–145)
Total Bilirubin: 0.4 mg/dL (ref 0.2–1.2)
Total Protein: 7 g/dL (ref 6.0–8.3)

## 2022-07-09 LAB — CBC WITH DIFFERENTIAL/PLATELET
Basophils Absolute: 0.1 10*3/uL (ref 0.0–0.1)
Basophils Relative: 1.2 % (ref 0.0–3.0)
Eosinophils Absolute: 0.1 10*3/uL (ref 0.0–0.7)
Eosinophils Relative: 2.1 % (ref 0.0–5.0)
HCT: 41.3 % (ref 36.0–46.0)
Hemoglobin: 13.8 g/dL (ref 12.0–15.0)
Lymphocytes Relative: 32.7 % (ref 12.0–46.0)
Lymphs Abs: 1.5 10*3/uL (ref 0.7–4.0)
MCHC: 33.3 g/dL (ref 30.0–36.0)
MCV: 86.7 fl (ref 78.0–100.0)
Monocytes Absolute: 0.5 10*3/uL (ref 0.1–1.0)
Monocytes Relative: 11.1 % (ref 3.0–12.0)
Neutro Abs: 2.5 10*3/uL (ref 1.4–7.7)
Neutrophils Relative %: 52.9 % (ref 43.0–77.0)
Platelets: 208 10*3/uL (ref 150.0–400.0)
RBC: 4.77 Mil/uL (ref 3.87–5.11)
RDW: 14.3 % (ref 11.5–15.5)
WBC: 4.6 10*3/uL (ref 4.0–10.5)

## 2022-07-09 LAB — VITAMIN D 25 HYDROXY (VIT D DEFICIENCY, FRACTURES): VITD: 69.74 ng/mL (ref 30.00–100.00)

## 2022-07-09 LAB — TSH: TSH: 2.24 u[IU]/mL (ref 0.35–5.50)

## 2022-07-09 LAB — B12 AND FOLATE PANEL
Folate: 13.6 ng/mL (ref >5.9)
Vitamin B-12: 305 pg/mL (ref 211–911)

## 2022-07-09 MED ORDER — EPINEPHRINE 0.3 MG/0.3ML IJ SOAJ: INTRAMUSCULAR | 1 refills | Status: AC

## 2022-07-09 MED ORDER — ELETRIPTAN HYDROBROMIDE 20 MG PO TABS: 20.0000 mg | ORAL_TABLET | ORAL | 5 refills | Status: AC | PRN

## 2022-07-09 NOTE — Patient Instructions (Addendum)
Vaccines I recommend:  Tdap (tetanus) Covid booster RSV vaccine      GO TO THE LAB : Get the blood work     [place], [place] back for   a checkup in 3 months

## 2022-07-09 NOTE — Progress Notes (Unsigned)
Subjective:    Patient ID: Cassandra Little, female    DOB: March 26, 1946, 76 y.o.   MRN: [id number]  DOS:  07/09/2022 Type of visit - description: Transferring care.  The patient is transferred to this office due to location. Chart was reviewed. History of migraines, has not been a problem lately Admits to some frustration about not being able to lose weight despite a healthy diet and being active. Has many years history of feeling dizzy sometimes when she moves quickly or exercises. Denies chest pain or difficulty breathing No palpitations. Has many years history of occasional visual disturbances not associated with diplopia, slurred speech or motor deficits.  Was told that she has migraines.   Review of Systems See above   Past Medical History:  Diagnosis Date   Anaphylaxis 02/14/2011   Unknown cause.  Always carries Epipen.   Angioedema    Anxiety    Arthritis    Cough    Depression    Dr. Abner Greenspan   Exposure to hepatitis C    GERD (gastroesophageal reflux disease)    Hyperlipidemia    Hypertension    IBS (irritable bowel syndrome)    Internal and external bleeding hemorrhoids    Migraine    PONV (postoperative nausea and vomiting)    Rosacea     Past Surgical History:  Procedure Laterality Date   ABDOMINOPLASTY  2000   APPENDECTOMY  2004   AUGMENTATION MAMMAPLASTY Bilateral 2011   [place]   x 2   COLONOSCOPY  09/15/2008   internal and external hemorroids   COSMETIC SURGERY     multiple   HEMORRHOID BANDING  2014   REDUCTION MAMMAPLASTY Bilateral 2002   TONSILLECTOMY  1962   TOTAL KNEE ARTHROPLASTY Right 12/26/2015   Procedure: TOTAL KNEE ARTHROPLASTY;  Surgeon: Paralee Cancel, MD;  Location: [facility];  Service: Orthopedics;  Laterality: Right;   TOTAL KNEE ARTHROPLASTY Left 04/25/2021   Procedure: TOTAL KNEE ARTHROPLASTY;  Surgeon: Paralee Cancel, MD;  Location: [facility];  Service: Orthopedics;  Laterality: Left;  70 mins   TOTAL SHOULDER  REPLACEMENT Bilateral 2009   right   TOTAL SHOULDER REPLACEMENT     UPPER GASTROINTESTINAL ENDOSCOPY  11/26/2018   Social History   Socioeconomic History   Marital status: Divorced    Spouse name: Not on file   Number of children: 2   Years of education: Not on file   Highest education level: Not on file  Occupational History   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[employer]    Employer: [employer]    Comment: Retired  Tobacco Use   Smoking status: Never   Smokeless tobacco: Never  Vaping Use   Vaping Use: Never used  Substance and Sexual Activity   Alcohol use: Yes    Alcohol/week: 1.0 standard drink of alcohol    Types: 1 Glasses of wine per week   Drug use: No   Sexual activity: Not Currently  Other Topics Concern   Not on file  Social History Narrative   She is divorced and retired from [employer]   1 cup of coffee and frequent tea consumption   Daughter lives in area [place] teacher   Son professor [employer]   1 drink a week no drugs no tobacco   Social Determinants of Health   Financial Resource Strain: Low Risk  (03/20/2021)   Overall Financial Resource Strain (CARDIA)    Difficulty of Paying Living Expenses: Not very hard  Food Insecurity: No Food Insecurity (03/20/2021)  Hunger Vital Sign    Worried About Running Out of Food in the Last Year: Never true    Ran Out of Food in the Last Year: Never true  Transportation Needs: No Transportation Needs (03/20/2021)   PRAPARE - Hydrologist (Medical): No    Lack of Transportation (Non-Medical): No  Physical Activity: Sufficiently Active (03/20/2021)   Exercise Vital Sign    Days of Exercise per Week: 6 days    Minutes of Exercise per Session: 40 min  Stress: No Stress Concern Present (03/20/2021)   [place]    Feeling of Stress : Not at all  Social Connections: Moderately Integrated (03/20/2021)   Social Connection and Isolation Panel [NHANES]    Frequency of  Communication with Friends and Family: More than three times a week    Frequency of Social Gatherings with Friends and Family: More than three times a week    Attends Religious Services: More than 4 times per year    Active Member of [employer] or Organizations: Yes    Attends Archivist Meetings: More than 4 times per year    Marital Status: Divorced  Intimate Partner Violence: Not At Risk (03/20/2021)   Humiliation, Afraid, Rape, and Kick questionnaire    Fear of Current or Ex-Partner: No    Emotionally Abused: No    Physically Abused: No    Sexually Abused: No    Current Outpatient Medications  Medication Instructions   acetaminophen (TYLENOL) 975 mg, Oral, Every 8 hours   ALPRAZolam (XANAX) 0.5 MG tablet alprazolam 0.5 mg tablet  TAKE 1 TABLET BY MOUTH EVERY DAY AS NEEDED   amoxicillin (AMOXIL) 500 MG capsule amoxicillin 500 mg capsule  TAKE 4 CAPSULES BY MOUTH 1 HOUR PRIOR TO DENTAL WORK   b complex vitamins tablet 1 tablet, Oral, Daily   cetirizine (ZYRTEC) 10 mg, Oral, Daily PRN   cholecalciferol (VITAMIN D) 1,000 Units, Oral, Daily   COLLAGEN PO 1 Scoop, Oral, Daily   cycloSPORINE (RESTASIS) 0.05 % ophthalmic emulsion No dose, route, or frequency recorded.   eletriptan (RELPAX) 20 mg, Oral, As needed, May repeat in 2 hours if headache persists or recurs.   EPINEPHrine (EPIPEN 2-PAK) 0.3 mg/0.3 mL IJ SOAJ injection INJECT 0.3 MLS (0.3 MG TOTAL) INTO THE MUSCLE ONCE.   escitalopram (LEXAPRO) 20 mg, Oral, Daily   esomeprazole (NEXIUM) 20 mg, Oral, Daily   ezetimibe (ZETIA) 10 mg, Oral, Daily   hydrocortisone (ANUSOL-HC) 2.5 % rectal cream 1 application , Rectal, 2 times daily PRN   rosuvastatin (CRESTOR) 20 mg, Oral, Daily       Objective:   Physical Exam BP 126/78   Pulse 88   Temp 98.7 F (37.1 C) (Oral)   Resp 16   Ht 5' (1.524 m)   Wt 162 lb 4 oz (73.6 kg)   SpO2 97%   BMI 31.69 kg/m  General: Well developed, NAD, BMI noted Neck: No  thyromegaly  HEENT:   Normocephalic . Face symmetric, atraumatic Lungs:  CTA B Normal respiratory effort, no intercostal retractions, no accessory muscle use. Heart: RRR,  no murmur.  Abdomen:  Not distended, soft, non-tender. No rebound or rigidity.   Lower extremities: no pretibial edema bilaterally  Skin: Exposed areas without rash. Not pale. Not jaundice Neurologic:  alert & oriented X3.  Speech normal, gait appropriate for age and unassisted Strength symmetric and appropriate for age.  Psych: Cognition and judgment appear  intact.  Cooperative with normal attention span and concentration.  Behavior appropriate. Tearful during times of the visit when we talk about a daughter-in-law who has cancer.     Assessment    Assessment (transferring to this office due to location 06-2022) Depression-anxiety. Sees Dr Toy Care  High cholesterol.  Seen at cardiology GERD Migraines (reports visual disturbances) DJD: B shoulder replacement; B TKR  ABX pre- dental d/t DJD H/o anaphylaxis- unknown trigger has a epipen ; has seen several specialists  Vit D def Fatigue, dizziness: Chronic per patient  PLAN First visit, transferring to this office due to location Depression anxiety: Sees Dr. Toy Care, symptoms are lately exacerbated due to her daughter-in-law has cancer again.  I did some listening therapy, her feelings are completely understandable.  Recommend to reach out to psychiatry, strongly recommend to consider psychotherapy. High cholesterol: Per cardiology Migraines: Has symptoms very rarely, RF Relpax. Anaphylaxis, h/o: Reportedly has seen several specialists before, etiology was never found.  Carries a EpiPen. Dizziness, fatigue: Going on for years, denies chest pain or difficulty breathing, physical exam is benign.  We will do general labs today including vitamins.  Reassess in 3 months. History of anemia: Checking labs Hyperglycemia: labs   BMI 31, she is very active, eats healthy, frustrated about not  being able to lose weight. Preventive care reviewed. RTC 3 months

## 2022-07-10 DIAGNOSIS — Z09 Encounter for follow-up examination after completed treatment for conditions other than malignant neoplasm: Secondary | ICD-10-CM | POA: Insufficient documentation

## 2022-07-10 LAB — HEMOGLOBIN A1C: Hgb A1c MFr Bld: 6.2 % (ref 4.6–6.5)

## 2022-07-10 NOTE — Assessment & Plan Note (Signed)
First visit, transferring to this office due to location Depression anxiety: Sees Dr. Toy Care, symptoms are lately exacerbated due to her daughter-in-law has cancer again.  I did some listening therapy, her feelings are completely understandable.  Recommend to reach out to psychiatry, strongly recommend to consider psychotherapy. High cholesterol: Per cardiology Migraines: Has symptoms very rarely, RF Relpax. Anaphylaxis, h/o: Reportedly has seen several specialists before, etiology was never found.  Carries a EpiPen. Dizziness, fatigue: Going on for years, denies chest pain or difficulty breathing, physical exam is benign.  We will do general labs today including vitamins.  Reassess in 3 months. History of anemia: Checking labs Hyperglycemia: labs   BMI 31, she is very active, eats healthy, frustrated about not being able to lose weight. Preventive care reviewed. RTC 3 months

## 2022-07-10 NOTE — Assessment & Plan Note (Signed)
This is not a physical exam but preventive care reviewed Td 2013 S/p shingrix PNM 23: 2014 PNM 13: 2018  RSV d/w pt  Covid vax- plans to get a shot soon Flu shot today  Sees gyn [facility] 2020, was told no recall

## 2022-09-04 ENCOUNTER — Encounter (HOSPITAL_BASED_OUTPATIENT_CLINIC_OR_DEPARTMENT_OTHER): Payer: Self-pay | Admitting: Internal Medicine

## 2022-09-04 NOTE — Telephone Encounter (Signed)
Reviewed medications with patient. She doesn't need refills at this time. Made 6 month f/u with Dr. Debara Pickett for 11/28

## 2022-09-11 ENCOUNTER — Ambulatory Visit: Payer: Medicare Other | Admitting: Internal Medicine

## 2022-09-23 ENCOUNTER — Other Ambulatory Visit: Payer: Self-pay | Admitting: Internal Medicine

## 2022-10-09 ENCOUNTER — Ambulatory Visit: Payer: Medicare Other

## 2022-10-09 ENCOUNTER — Ambulatory Visit: Payer: Medicare Other | Admitting: Internal Medicine

## 2022-10-09 VITALS — BP 134/80 | HR 75 | Temp 97.9°F | Resp 18 | Ht 60.0 in | Wt 160.8 lb

## 2022-10-09 DIAGNOSIS — N959 Unspecified menopausal and perimenopausal disorder: Secondary | ICD-10-CM

## 2022-10-09 DIAGNOSIS — E669 Obesity, unspecified: Secondary | ICD-10-CM | POA: Diagnosis not present

## 2022-10-09 DIAGNOSIS — Z23 Encounter for immunization: Secondary | ICD-10-CM

## 2022-10-09 DIAGNOSIS — R739 Hyperglycemia, unspecified: Secondary | ICD-10-CM | POA: Insufficient documentation

## 2022-10-09 DIAGNOSIS — Z1231 Encounter for screening mammogram for malignant neoplasm of breast: Secondary | ICD-10-CM

## 2022-10-09 DIAGNOSIS — Z0000 Encounter for general adult medical examination without abnormal findings: Secondary | ICD-10-CM

## 2022-10-09 MED ORDER — METFORMIN HCL 500 MG PO TABS: 500.0000 mg | ORAL_TABLET | Freq: Two times a day (BID) | ORAL | 6 refills | Status: DC

## 2022-10-09 NOTE — Assessment & Plan Note (Signed)
Obesity: Mild, BMI 31.  Unable to lose weight despite having a healthy lifestyle, she inquired about newer medications for weight loss, she was made aware of cost, potential side effects, the need to take it for life.  She is not interested at this point. See next Hyperglycemia: A1c 6.2, she reports that in the past her insulin levels were elevated;  metformin is an excellent medication for hyperglycemia with a potential to lose some weight.  We agreed to start a low-dose of metformin, 500 mg twice daily, see AVS. Preventive care reviewed again.   RTC 3 months CPX

## 2022-10-09 NOTE — Patient Instructions (Signed)
Vaccines I recommend: Tetanus shot (Tdap)  Start metformin: Half tablet twice daily with meals After 10 days take 1 tablet twice daily with meals      GO TO THE FRONT DESK, PLEASE SCHEDULE YOUR APPOINTMENTS Come back for a visit and exam in 3 months  STOP BY THE FIRST FLOOR: Schedule a mammogram and a bone density test

## 2022-10-09 NOTE — Assessment & Plan Note (Signed)
Td 2013.  Recommend to proceed with a Tdap at the pharmacy S/p shingrix PNM 23: 2014 PNM 13: 2018  PNM 20: Today RSV d/w pt before  Covid vax- UTD per   Had a flu shot  Sees gyn Carmine [facility]oy  Mammograms: Reports she is overdue for one, ordered. Menopausal: No recent DEXA order one [facility] 2020, was told no recall

## 2022-10-09 NOTE — Progress Notes (Signed)
Subjective:    Patient ID: Cassandra Little, female    DOB: 1946/04/09, 76 y.o.   MRN: [id number]  DOS:  10/09/2022 Type of visit - description: f/u Follow-up She remains active and is trying to eat healthy but continue having difficulty losing weight. We talk about preventive care today.   Review of Systems See above   Past Medical History:  Diagnosis Date   Anaphylaxis 02/14/2011   Unknown cause.  Always carries Epipen.   Angioedema    Anxiety    Arthritis    Cough    Depression    Dr. Abner Greenspan   Exposure to hepatitis C    GERD (gastroesophageal reflux disease)    Hyperlipidemia    Hypertension    IBS (irritable bowel syndrome)    Internal and external bleeding hemorrhoids    Migraine    PONV (postoperative nausea and vomiting)    Rosacea     Past Surgical History:  Procedure Laterality Date   ABDOMINOPLASTY  2000   APPENDECTOMY  2004   AUGMENTATION MAMMAPLASTY Bilateral 2011   [place]   x 2   COLONOSCOPY  09/15/2008   internal and external hemorroids   COSMETIC SURGERY     multiple   HEMORRHOID BANDING  2014   REDUCTION MAMMAPLASTY Bilateral 2002   TONSILLECTOMY  1962   TOTAL KNEE ARTHROPLASTY Right 12/26/2015   Procedure: TOTAL KNEE ARTHROPLASTY;  Surgeon: Paralee Cancel, MD;  Location: [facility];  Service: Orthopedics;  Laterality: Right;   TOTAL KNEE ARTHROPLASTY Left 04/25/2021   Procedure: TOTAL KNEE ARTHROPLASTY;  Surgeon: Paralee Cancel, MD;  Location: [facility];  Service: Orthopedics;  Laterality: Left;  70 mins   TOTAL SHOULDER REPLACEMENT Bilateral 2009   right   TOTAL SHOULDER REPLACEMENT     UPPER GASTROINTESTINAL ENDOSCOPY  11/26/2018    Current Outpatient Medications  Medication Instructions   acetaminophen (TYLENOL) 975 mg, Oral, Every 8 hours   ALPRAZolam (XANAX) 0.5 MG tablet alprazolam 0.5 mg tablet  TAKE 1 TABLET BY MOUTH EVERY DAY AS NEEDED   amoxicillin (AMOXIL) 500 MG capsule amoxicillin 500 mg capsule  TAKE 4 CAPSULES  BY MOUTH 1 HOUR PRIOR TO DENTAL WORK   b complex vitamins tablet 1 tablet, Oral, Daily   cetirizine (ZYRTEC) 10 mg, Oral, Daily PRN   cholecalciferol (VITAMIN D) 1,000 Units, Oral, Daily   COLLAGEN PO 1 Scoop, Oral, Daily   cycloSPORINE (RESTASIS) 0.05 % ophthalmic emulsion No dose, route, or frequency recorded.   eletriptan (RELPAX) 20 mg, Oral, As needed, May repeat in 2 hours if headache persists or recurs.   EPINEPHrine (EPIPEN 2-PAK) 0.3 mg/0.3 mL IJ SOAJ injection INJECT 0.3 MLS (0.3 MG TOTAL) INTO THE MUSCLE ONCE.   escitalopram (LEXAPRO) 20 mg, Oral, Daily   esomeprazole (NEXIUM) 20 mg, Oral, Daily   ezetimibe (ZETIA) 10 mg, Oral, Daily   hydrocortisone (ANUSOL-HC) 2.5 % rectal cream 1 application , Rectal, 2 times daily PRN   metFORMIN (GLUCOPHAGE) 500 mg, Oral, 2 times daily with meals   rosuvastatin (CRESTOR) 20 mg, Oral, Daily       Objective:   Physical Exam BP 134/80 (BP Location: Left Arm, Patient Position: Sitting, Cuff Size: Normal)   Pulse 75   Temp 97.9 F (36.6 C) (Temporal)   Resp 18   Ht 5' (1.524 m)   Wt 160 lb 12.8 oz (72.9 kg)   SpO2 95%   BMI 31.40 kg/m  General:   Well developed, NAD, BMI  noted. HEENT:  Normocephalic . Face symmetric, atraumatic Lungs:  CTA B Normal respiratory effort, no intercostal retractions, no accessory muscle use. Heart: RRR,  no murmur.  Lower extremities: no pretibial edema bilaterally  Skin: Not pale. Not jaundice Neurologic:  alert & oriented X3.  Speech normal, gait appropriate for age and unassisted Psych--  Cognition and judgment appear intact.  Cooperative with normal attention span and concentration.  Behavior appropriate. No anxious or depressed appearing.      Assessment    Assessment (transferring to this office due to location 06-2022) Depression-anxiety. Sees Dr Toy Care  High cholesterol.  Seen at cardiology GERD Migraines (reports visual disturbances) DJD: B shoulder replacement; B TKR  ABX pre-  dental d/t DJD H/o anaphylaxis- unknown trigger has a epipen ; has seen several specialists  Vit D def Fatigue, dizziness: Chronic per patient  PLAN Obesity: Mild, BMI 31.  Unable to lose weight despite having a healthy lifestyle, she inquired about newer medications for weight loss, she was made aware of cost, potential side effects, the need to take it for life.  She is not interested at this point. See next Hyperglycemia: A1c 6.2, she reports that in the past her insulin levels were elevated;  metformin is an excellent medication for hyperglycemia with a potential to lose some weight.  We agreed to start a low-dose of metformin, 500 mg twice daily, see AVS. Preventive care reviewed again.   RTC 3 months CPX

## 2022-10-10 ENCOUNTER — Ambulatory Visit (INDEPENDENT_AMBULATORY_CARE_PROVIDER_SITE_OTHER): Payer: Medicare Other | Admitting: *Deleted

## 2022-10-10 DIAGNOSIS — Z0000 Encounter for general adult medical examination without abnormal findings: Secondary | ICD-10-CM | POA: Diagnosis not present

## 2022-10-10 NOTE — Patient Instructions (Signed)
Cassandra Little , Thank you for taking time to come for your Medicare Wellness Visit. I appreciate your ongoing commitment to your health goals. Please review the following plan we discussed and let me know if I can assist you in the future.   These are the goals we discussed:  Goals      Patient Stated     Loose 10 lbs     Weight (lb) < 150 lb (68 kg)     Lose weight by increasing balance and core work.         This is a list of the screening recommended for you and due dates:  Health Maintenance  Topic Date Due   DTaP/Tdap/Td vaccine (3 - Td or Tdap) 03/12/2022   Mammogram  03/28/2022   COVID-19 Vaccine (6 - 2023-24 season) 06/15/2022   Medicare Annual Wellness Visit  10/11/2023   Pneumonia Vaccine  Completed   Flu Shot  Completed   DEXA scan (bone density measurement)  Completed   Hepatitis C Screening: USPSTF Recommendation to screen - Ages 18-79 yo.  Completed   Zoster (Shingles) Vaccine  Completed   HPV Vaccine  Aged Out     Next appointment: Follow up in one year for your annual wellness visit.   Preventive Care 74 Years and Older, Female Preventive care refers to lifestyle choices and visits with your health care provider that can promote health and wellness. What does preventive care include? A yearly physical exam. This is also called an annual well check. Dental exams once or twice a year. Routine eye exams. Ask your health care provider how often you should have your eyes checked. Personal lifestyle choices, including: Daily care of your teeth and gums. Regular physical activity. Eating a healthy diet. Avoiding tobacco and drug use. Limiting alcohol use. Practicing safe sex. Taking low-dose aspirin every day. Taking vitamin and mineral supplements as recommended by your health care provider. What happens during an annual well check? The services and screenings done by your health care provider during your annual well check will depend on your age, overall  health, lifestyle risk factors, and family history of disease. Counseling  Your health care provider may ask you questions about your: Alcohol use. Tobacco use. Drug use. Emotional well-being. Home and relationship well-being. Sexual activity. Eating habits. History of falls. Memory and ability to understand (cognition). Work and work Statistician. Reproductive health. Screening  You may have the following tests or measurements: Height, weight, and BMI. Blood pressure. Lipid and cholesterol levels. These may be checked every 5 years, or more frequently if you are over 1 years old. Skin check. Lung cancer screening. You may have this screening every year starting at age 42 if you have a 30-pack-year history of smoking and currently smoke or have quit within the past 15 years. Fecal occult blood test (FOBT) of the stool. You may have this test every year starting at age 79. Flexible sigmoidoscopy or colonoscopy. You may have a sigmoidoscopy every 5 years or a colonoscopy every 10 years starting at age 69. Hepatitis C blood test. Hepatitis B blood test. Sexually transmitted disease (STD) testing. Diabetes screening. This is done by checking your blood sugar (glucose) after you have not eaten for a while (fasting). You may have this done every 1-3 years. Bone density scan. This is done to screen for osteoporosis. You may have this done starting at age 13. Mammogram. This may be done every 1-2 years. Talk to your health care provider about how  often you should have regular mammograms. Talk with your health care provider about your test results, treatment options, and if necessary, the need for more tests. Vaccines  Your health care provider may recommend certain vaccines, such as: Influenza vaccine. This is recommended every year. Tetanus, diphtheria, and acellular pertussis (Tdap, Td) vaccine. You may need a Td booster every 10 years. Zoster vaccine. You may need this after age  60. Pneumococcal 13-valent conjugate (PCV13) vaccine. One dose is recommended after age 38. Pneumococcal polysaccharide (PPSV23) vaccine. One dose is recommended after age 38. Talk to your health care provider about which screenings and vaccines you need and how often you need them. This information is not intended to replace advice given to you by your health care provider. Make sure you discuss any questions you have with your health care provider. Document Released: 10/28/2015 Document Revised: 06/20/2016 Document Reviewed: 08/02/2015 Elsevier Interactive Patient Education  2017 [place] Prevention in the Home Falls can cause injuries. They can happen to people of all ages. There are many things you can do to make your home safe and to help prevent falls. What can I do on the outside of my home? Regularly fix the edges of walkways and driveways and fix any cracks. Remove anything that might make you trip as you walk through a door, such as a raised step or threshold. Trim any bushes or trees on the path to your home. Use bright outdoor lighting. Clear any walking paths of anything that might make someone trip, such as rocks or tools. Regularly check to see if handrails are loose or broken. Make sure that both sides of any steps have handrails. Any raised decks and porches should have guardrails on the edges. Have any leaves, snow, or ice cleared regularly. Use sand or salt on walking paths during winter. Clean up any spills in your garage right away. This includes oil or grease spills. What can I do in the bathroom? Use night lights. Install grab bars by the toilet and in the tub and shower. Do not use towel bars as grab bars. Use non-skid mats or decals in the tub or shower. If you need to sit down in the shower, use a plastic, non-slip stool. Keep the floor dry. Clean up any water that spills on the floor as soon as it happens. Remove soap buildup in the tub or shower  regularly. Attach bath mats securely with double-sided non-slip rug tape. Do not have throw rugs and other things on the floor that can make you trip. What can I do in the bedroom? Use night lights. Make sure that you have a light by your bed that is easy to reach. Do not use any sheets or blankets that are too big for your bed. They should not hang down onto the floor. Have a firm chair that has side arms. You can use this for support while you get dressed. Do not have throw rugs and other things on the floor that can make you trip. What can I do in the kitchen? Clean up any spills right away. Avoid walking on wet floors. Keep items that you use a lot in easy-to-reach places. If you need to reach something above you, use a strong step stool that has a grab bar. Keep electrical cords out of the way. Do not use floor polish or wax that makes floors slippery. If you must use wax, use non-skid floor wax. Do not have throw rugs and other things  on the floor that can make you trip. What can I do with my stairs? Do not leave any items on the stairs. Make sure that there are handrails on both sides of the stairs and use them. Fix handrails that are broken or loose. Make sure that handrails are as long as the stairways. Check any carpeting to make sure that it is firmly attached to the stairs. Fix any carpet that is loose or worn. Avoid having throw rugs at the top or bottom of the stairs. If you do have throw rugs, attach them to the floor with carpet tape. Make sure that you have a light switch at the top of the stairs and the bottom of the stairs. If you do not have them, ask someone to add them for you. What else can I do to help prevent falls? Wear shoes that: Do not have high heels. Have rubber bottoms. Are comfortable and fit you well. Are closed at the toe. Do not wear sandals. If you use a stepladder: Make sure that it is fully opened. Do not climb a closed stepladder. Make sure that  both sides of the stepladder are locked into place. Ask someone to hold it for you, if possible. Clearly mark and make sure that you can see: Any grab bars or handrails. First and last steps. Where the edge of each step is. Use tools that help you move around (mobility aids) if they are needed. These include: Canes. Walkers. Scooters. Crutches. Turn on the lights when you go into a dark area. Replace any light bulbs as soon as they burn out. Set up your furniture so you have a clear path. Avoid moving your furniture around. If any of your floors are uneven, fix them. If there are any pets around you, be aware of where they are. Review your medicines with your doctor. Some medicines can make you feel dizzy. This can increase your chance of falling. Ask your doctor what other things that you can do to help prevent falls. This information is not intended to replace advice given to you by your health care provider. Make sure you discuss any questions you have with your health care provider. Document Released: 07/28/2009 Document Revised: 03/08/2016 Document Reviewed: 11/05/2014 Elsevier Interactive Patient Education  2017 [employer].

## 2022-10-10 NOTE — Progress Notes (Addendum)
Subjective:   Cassandra Little is a 76 y.o. female who presents for Medicare Annual (Subsequent) preventive examination.  I connected with  Cassandra Little on 10/10/22 by a audio enabled telemedicine application and verified that I am speaking with the correct person using two identifiers.  Patient Location: Home  Provider Location: Office/Clinic  I discussed the limitations of evaluation and management by telemedicine. The patient expressed understanding and agreed to proceed.   Review of Systems    Defer to PCP Cardiac Risk Factors include: advanced age (>49mn, >>51women);dyslipidemia;hypertension;obesity (BMI >30kg/m2)     Objective:    There were no vitals filed for this visit. There is no height or weight on file to calculate BMI.     10/10/2022    9:46 AM 04/25/2021   10:00 AM 04/12/2021    9:50 AM 03/20/2021    9:05 AM 02/20/2017    3:18 PM 12/26/2015   10:25 AM 12/12/2015    9:44 AM  Advanced Directives  Does Patient Have a Medical Advance Directive? Yes Yes Yes Yes Yes Yes Yes  Type of AParamedicof AHopeLiving will H[place]urgeonLiving will [place]ay[place]Living will Healthcare Power of Attorney Living will;Healthcare Power of [place]ainsLiving will HBaileyvilleLiving will  Does patient want to make changes to medical advance directive? No - Patient declined No - Patient declined    No - Patient declined No - Patient declined  Copy of HNevadain Chart? Yes - validated most recent copy scanned in chart (See row information)   No - copy requested No - copy requested Yes Yes    Current Medications (verified) Outpatient Encounter Medications as of 10/10/2022  Medication Sig   acetaminophen (TYLENOL) 325 MG tablet Take 3 tablets (975 mg total) by mouth every 8 (eight) hours. (Patient taking differently: Take 1,000 mg by mouth as needed.)   ALPRAZolam  (XANAX) 0.5 MG tablet alprazolam 0.5 mg tablet  TAKE 1 TABLET BY MOUTH EVERY DAY AS NEEDED   amoxicillin (AMOXIL) 500 MG capsule amoxicillin 500 mg capsule  TAKE 4 CAPSULES BY MOUTH 1 HOUR PRIOR TO DENTAL WORK   b complex vitamins tablet Take 1 tablet by mouth daily.   cetirizine (ZYRTEC) 10 MG tablet Take 10 mg by mouth daily as needed for allergies.   cholecalciferol (VITAMIN D) 1000 units tablet Take 1,000 Units by mouth daily.   COLLAGEN PO Take 1 Scoop by mouth daily.   cycloSPORINE (RESTASIS) 0.05 % ophthalmic emulsion    eletriptan (RELPAX) 20 MG tablet Take 1 tablet (20 mg total) by mouth as needed for migraine or headache. May repeat in 2 hours if headache persists or recurs.   EPINEPHrine (EPIPEN 2-PAK) 0.3 mg/0.3 mL IJ SOAJ injection INJECT 0.3 MLS (0.3 MG TOTAL) INTO THE MUSCLE ONCE.   escitalopram (LEXAPRO) 20 MG tablet Take 20 mg by mouth daily.   esomeprazole (NEXIUM) 20 MG capsule Take 20 mg by mouth daily.   ezetimibe (ZETIA) 10 MG tablet Take 1 tablet (10 mg total) by mouth daily.   hydrocortisone (ANUSOL-HC) 2.5 % rectal cream Place 1 application rectally 2 (two) times daily as needed for hemorrhoids or anal itching.   metFORMIN (GLUCOPHAGE) 500 MG tablet Take 1 tablet (500 mg total) by mouth 2 (two) times daily with a meal.   rosuvastatin (CRESTOR) 20 MG tablet TAKE 1 TABLET BY MOUTH EVERY DAY   No facility-administered encounter medications on  file as of 10/10/2022.    Allergies (verified) Ace inhibitors   History: Past Medical History:  Diagnosis Date   Anaphylaxis 02/14/2011   Unknown cause.  Always carries Epipen.   Angioedema    Anxiety    Arthritis    Cough    Depression    Dr. Abner Greenspan   Exposure to hepatitis C    GERD (gastroesophageal reflux disease)    Hyperlipidemia    Hypertension    IBS (irritable bowel syndrome)    Internal and external bleeding hemorrhoids    Migraine    PONV (postoperative nausea and vomiting)    Rosacea    Past Surgical  History:  Procedure Laterality Date   ABDOMINOPLASTY  2000   APPENDECTOMY  2004   AUGMENTATION MAMMAPLASTY Bilateral 2011   [place]   x 2   COLONOSCOPY  09/15/2008   internal and external hemorroids   COSMETIC SURGERY     multiple   HEMORRHOID BANDING  2014   REDUCTION MAMMAPLASTY Bilateral 2002   TONSILLECTOMY  1962   TOTAL KNEE ARTHROPLASTY Right 12/26/2015   Procedure: TOTAL KNEE ARTHROPLASTY;  Surgeon: Paralee Cancel, MD;  Location: [facility];  Service: Orthopedics;  Laterality: Right;   TOTAL KNEE ARTHROPLASTY Left 04/25/2021   Procedure: TOTAL KNEE ARTHROPLASTY;  Surgeon: Paralee Cancel, MD;  Location: [facility];  Service: Orthopedics;  Laterality: Left;  70 mins   TOTAL SHOULDER REPLACEMENT Bilateral 2009   right   TOTAL SHOULDER REPLACEMENT     UPPER GASTROINTESTINAL ENDOSCOPY  11/26/2018   Family History  Problem Relation Age of Onset   Hyperlipidemia Mother    Heart disease Mother    Allergic rhinitis Mother    Eczema Mother    Diabetes Father    Hypertension Father    Heart attack Father        64s   Heart disease Father    Obesity Sister    Hypertension Sister    Hyperlipidemia Sister    Colon cancer Neg Hx    Stomach cancer Neg Hx    Rectal cancer Neg Hx    Pancreatic cancer Neg Hx    Esophageal cancer Neg Hx    Angioedema Neg Hx    Asthma Neg Hx    Atopy Neg Hx    Immunodeficiency Neg Hx    Urticaria Neg Hx    Breast cancer Neg Hx    Social History   Socioeconomic History   Marital status: Divorced    Spouse name: Not on file   Number of children: 2   Years of education: Not on file   Highest education level: Not on file  Occupational History   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[employer]    Employer: [employer]    Comment: Retired  Tobacco Use   Smoking status: Never   Smokeless tobacco: Never  Vaping Use   Vaping Use: Never used  Substance and Sexual Activity   Alcohol use: Yes    Alcohol/week: 1.0 standard drink of alcohol    Types: 1 Glasses of  wine per week   Drug use: No   Sexual activity: Not Currently  Other Topics Concern   Not on file  Social History Narrative   She is divorced and retired from [employer]   1 cup of coffee and frequent tea consumption   Daughter lives in area [place] teacher   Son professor [employer]   1 drink a week no drugs no tobacco   Social Determinants of Health  Financial Resource Strain: Low Risk  (03/20/2021)   Overall Financial Resource Strain (CARDIA)    Difficulty of Paying Living Expenses: Not very hard  Food Insecurity: No Food Insecurity (10/10/2022)   Hunger Vital Sign    Worried About Running Out of Food in the Last Year: Never true    Ran Out of Food in the Last Year: Never true  Transportation Needs: No Transportation Needs (10/10/2022)   PRAPARE - Hydrologist (Medical): No    Lack of Transportation (Non-Medical): No  Physical Activity: Sufficiently Active (03/20/2021)   Exercise Vital Sign    Days of Exercise per Week: 6 days    Minutes of Exercise per Session: 40 min  Stress: No Stress Concern Present (03/20/2021)   [place]    Feeling of Stress : Not at all  Social Connections: Moderately Integrated (03/20/2021)   Social Connection and Isolation Panel [NHANES]    Frequency of Communication with Friends and Family: More than three times a week    Frequency of Social Gatherings with Friends and Family: More than three times a week    Attends Religious Services: More than 4 times per year    Active Member of [employer] or Organizations: Yes    Attends Music therapist: More than 4 times per year    Marital Status: Divorced    Tobacco Counseling Counseling given: Not Answered   Clinical Intake:  Pre-visit preparation completed: Yes  Pain : No/denies pain  Diabetes: No  How often do you need to have someone help you when you read instructions, pamphlets, or other written materials  from your doctor or pharmacy?: 1 - Never  Activities of Daily Living    10/10/2022    9:49 AM  In your present state of health, do you have any difficulty performing the following activities:  Hearing? 0  Vision? 0  Difficulty concentrating or making decisions? 0  Walking or climbing stairs? 0  Dressing or bathing? 0  Doing errands, shopping? 0  Preparing Food and eating ? N  Using the Toilet? N  In the past six months, have you accidently leaked urine? Y  Do you have problems with loss of bowel control? N  Managing your Medications? N  Managing your Finances? N  Housekeeping or managing your Housekeeping? N    Patient Care Team: Colon Branch, MD as PCP - General (Internal Medicine) Paralee Cancel, MD as Consulting Physician (Orthopedic Surgery) Megan Salon, MD as Consulting Physician (Gynecology) Chucky May, MD as Consulting Physician (Psychiatry)  Indicate any recent Medical Services you may have received from other than [facility] providers in the past year (date may be approximate).     Assessment:   This is a routine wellness examination for [place].  Hearing/Vision screen No results found.  Dietary issues and exercise activities discussed: Current Exercise Habits: Structured exercise class, Type of exercise: calisthenics;strength training/weights;yoga;Other - see comments (Pilates), Frequency (Times/Week): 6, Intensity: Mild, Exercise limited by: None identified   Goals Addressed   None    Depression Screen    10/10/2022    9:48 AM 07/09/2022   10:20 AM 04/11/2021    8:48 AM 03/20/2021    9:20 AM 12/27/2020   12:37 PM 02/20/2017    3:19 PM 11/09/2015    1:23 PM  PHQ 2/9 Scores  PHQ - 2 Score 0 0 0 0 0 0 0  PHQ- 9 Score  0 0  0    Exception Documentation       Patient refusal    Fall Risk    10/10/2022    9:46 AM 07/09/2022   10:26 AM 04/11/2021    8:48 AM 03/20/2021    9:11 AM 12/27/2020   12:37 PM  [place] in the past year? 0 0 0 0 0  Number  falls in past yr: 0 0 0 0 0  Injury with Fall? 0 0 0 0 0  Risk for fall due to : No Fall Risks  No Fall Risks No Fall Risks No Fall Risks  Follow up Falls evaluation completed Falls evaluation completed  Falls evaluation completed;Falls prevention discussed     FALL RISK PREVENTION PERTAINING TO THE HOME:  Any stairs in or around the home? Yes  If so, are there any without handrails? No  Home free of loose throw rugs in walkways, pet beds, electrical cords, etc? Yes  Adequate lighting in your home to reduce risk of falls? Yes   ASSISTIVE DEVICES UTILIZED TO PREVENT FALLS:  Life alert? No  Use of a cane, walker or w/c? No  Grab bars in the bathroom? No  Shower chair or bench in shower? No  Elevated toilet seat or a handicapped toilet?  Comfort height  TIMED UP AND GO:  Was the test performed?  No, audio visit .   Cognitive Function:        10/10/2022    9:59 AM  6CIT Screen  What Year? 0 points  What month? 0 points  What time? 0 points  Count back from 20 0 points  Months in reverse 0 points  Repeat phrase 4 points  Total Score 4 points    Immunizations Immunization History  Administered Date(s) Administered   Fluad Quad(high Dose 65+) 07/09/2022   Hepatitis B 08/10/2005, 09/19/2005, 05/28/2006   Influenza Inj Mdck Quad Pf 08/21/2017   Influenza, High Dose Seasonal PF 09/24/2018   Moderna Covid-19 Vaccine Bivalent Booster 47yr & up 08/01/2021   Moderna Sars-Covid-2 Vaccination 11/17/2019, 12/17/2019, 08/09/2020, 02/23/2021   PNEUMOCOCCAL CONJUGATE-20 10/09/2022   Pneumococcal Conjugate-13 02/20/2017   Pneumococcal Polysaccharide-23 05/27/2013   Td 03/12/2002   Tdap 03/12/2012   Zoster Recombinat (Shingrix) 02/20/2017, 05/15/2017   Zoster, Live 05/02/2009    TDAP status: Due, Education has been provided regarding the importance of this vaccine. Advised may receive this vaccine at local pharmacy or Health Dept. Aware to provide a copy of the vaccination  record if obtained from local pharmacy or Health Dept. Verbalized acceptance and understanding.  Flu Vaccine status: Up to date  Pneumococcal vaccine status: Up to date  Covid-19 vaccine status: Information provided on how to obtain vaccines.   Qualifies for Shingles Vaccine? Yes   Zostavax completed Yes   Shingrix Completed?: Yes  Screening Tests Health Maintenance  Topic Date Due   DTaP/Tdap/Td (3 - Td or Tdap) 03/12/2022   Medicare Annual Wellness (AWV)  03/20/2022   MAMMOGRAM  03/28/2022   COVID-19 Vaccine (6 - 2023-24 season) 06/15/2022   Pneumonia Vaccine 76 Years old  Completed   INFLUENZA VACCINE  Completed   DEXA SCAN  Completed   Hepatitis C Screening  Completed   Zoster Vaccines- Shingrix  Completed   HPV VACCINES  Aged Out    Health Maintenance  Health Maintenance Due  Topic Date Due   DTaP/Tdap/Td (3 - Td or Tdap) 03/12/2022   Medicare Annual Wellness (AWV)  03/20/2022   MAMMOGRAM  03/28/2022  COVID-19 Vaccine (6 - 2023-24 season) 06/15/2022    Colorectal cancer screening: Type of screening: Colonoscopy. Completed 11/26/18. Repeat every N/a years  Mammogram status: Ordered Pt is scheduled for 10/30/22. Pt provided with contact info and advised to call to schedule appt.   Bone Density status: Ordered Pt is scheduled for 10/30/22. Pt provided with contact info and advised to call to schedule appt.  Lung Cancer Screening: (Low Dose CT Chest recommended if Age 65-80 years, 30 pack-year currently smoking OR have quit w/in 15years.) does not qualify.   Additional Screening:  Hepatitis C Screening: does qualify; Completed 02/27/16  Vision Screening: Recommended annual ophthalmology exams for early detection of glaucoma and other disorders of the eye. Is the patient up to date with their annual eye exam?  Yes  Who is the provider or what is the name of the office in which the patient attends annual eye exams? [facility]. If pt is not established with a  provider, would they like to be referred to a provider to establish care? No .   Dental Screening: Recommended annual dental exams for proper oral hygiene  Community Resource Referral / Chronic Care Management: CRR required this visit?  No   CCM required this visit?  No      Plan:     I have personally reviewed and noted the following in the patient's chart:   Medical and social history Use of alcohol, tobacco or illicit drugs  Current medications and supplements including opioid prescriptions. Patient is not currently taking opioid prescriptions. Functional ability and status Nutritional status Physical activity Advanced directives List of other physicians Hospitalizations, surgeries, and ER visits in previous 12 months Vitals Screenings to include cognitive, depression, and falls Referrals and appointments  In addition, I have reviewed and discussed with patient certain preventive protocols, quality metrics, and best practice recommendations. A written personalized care plan for preventive services as well as general preventive health recommendations were provided to patient.   Due to this being a telephonic visit, the after visit summary with patients personalized plan was offered to patient via mail or my-chart. Patient would like to access on my-chart.  Beatris Ship, [place]   10/10/2022   Nurse Notes: None   I have reviewed and agree with [facility] documentation.  Kathlene November, MD

## 2022-10-23 ENCOUNTER — Other Ambulatory Visit (HOSPITAL_BASED_OUTPATIENT_CLINIC_OR_DEPARTMENT_OTHER): Payer: Medicare Other

## 2022-10-23 ENCOUNTER — Ambulatory Visit (HOSPITAL_BASED_OUTPATIENT_CLINIC_OR_DEPARTMENT_OTHER): Payer: Medicare Other

## 2022-10-30 ENCOUNTER — Encounter (HOSPITAL_BASED_OUTPATIENT_CLINIC_OR_DEPARTMENT_OTHER): Payer: Self-pay

## 2022-10-30 ENCOUNTER — Ambulatory Visit (HOSPITAL_BASED_OUTPATIENT_CLINIC_OR_DEPARTMENT_OTHER)
Admission: RE | Admit: 2022-10-30 | Discharge: 2022-10-30 | Disposition: A | Discharge status: Home / Self Care | Payer: Medicare Other | Source: Ambulatory Visit | Attending: Internal Medicine | Admitting: Internal Medicine

## 2022-10-30 DIAGNOSIS — N959 Unspecified menopausal and perimenopausal disorder: Secondary | ICD-10-CM | POA: Diagnosis present

## 2022-10-30 DIAGNOSIS — Z1231 Encounter for screening mammogram for malignant neoplasm of breast: Secondary | ICD-10-CM | POA: Diagnosis present

## 2022-12-14 ENCOUNTER — Other Ambulatory Visit: Payer: Self-pay | Admitting: Internal Medicine

## 2023-01-07 ENCOUNTER — Encounter: Payer: Self-pay | Admitting: Internal Medicine

## 2023-01-07 ENCOUNTER — Ambulatory Visit (INDEPENDENT_AMBULATORY_CARE_PROVIDER_SITE_OTHER): Payer: Medicare Other | Admitting: Internal Medicine

## 2023-01-07 VITALS — BP 126/82 | HR 83 | Temp 98.4°F | Resp 16 | Ht 60.0 in | Wt 153.2 lb

## 2023-01-07 DIAGNOSIS — R739 Hyperglycemia, unspecified: Secondary | ICD-10-CM | POA: Diagnosis not present

## 2023-01-07 DIAGNOSIS — E785 Hyperlipidemia, unspecified: Secondary | ICD-10-CM

## 2023-01-07 LAB — BASIC METABOLIC PANEL
BUN: 22 mg/dL (ref 6–23)
CO2: 29 mEq/L (ref 19–32)
Calcium: 10 mg/dL (ref 8.4–10.5)
Chloride: 102 mEq/L (ref 96–112)
Creatinine, Ser: 0.97 mg/dL (ref 0.40–1.20)
GFR: 57.52 mL/min — ABNORMAL LOW (ref >60.00)
Glucose, Bld: 116 mg/dL — ABNORMAL HIGH (ref 70–99)
Potassium: 4.7 mEq/L (ref 3.5–5.1)
Sodium: 140 mEq/L (ref 135–145)

## 2023-01-07 LAB — AST: AST: 23 U/L (ref 0–37)

## 2023-01-07 LAB — ALT: ALT: 25 U/L (ref 0–35)

## 2023-01-07 LAB — HEMOGLOBIN A1C: Hgb A1c MFr Bld: 6.1 % (ref 4.6–6.5)

## 2023-01-07 MED ORDER — METFORMIN HCL ER 500 MG PO TB24: 500.0000 mg | ORAL_TABLET | Freq: Two times a day (BID) | ORAL | 0 refills | Status: DC

## 2023-01-07 NOTE — Assessment & Plan Note (Signed)
Hyperglycemia: Started metformin in December, doing better with exercise, + weight loss noted.  Has mild  nausea but no diarrhea, likely a s/e from metformin Plan: Switch metformin to the XR formulation 500 mg twice daily, check BMP LFTs A1c. High cholesterol: Per cardiovascular team. Preventive care: See AVS RTC 6 months CPX

## 2023-01-07 NOTE — Patient Instructions (Addendum)
Vaccines I recommend:  Covid booster Tdap (tetanus) RSV vaccine   GO TO THE LAB : Get the blood work     [place], [place] Come back for a physical exam by 06-2023

## 2023-01-07 NOTE — Progress Notes (Signed)
Subjective:    Patient ID: Cassandra Little, female    DOB: [birth year], 77 y.o.   MRN: [id number]  DOS:  01/07/2023 Type of visit - description: f/u  Since last office visit is doing well. More active, + weight loss noted. Started metformin, c/o mild nausea but no diarrhea.  Wt Readings from Last 3 Encounters:  01/07/23 153 lb 4 oz (69.5 kg)  10/09/22 160 lb 12.8 oz (72.9 kg)  07/09/22 162 lb 4 oz (73.6 kg)     Review of Systems See above   Past Medical History:  Diagnosis Date   Anaphylaxis 02/14/2011   Unknown cause.  Always carries Epipen.   Angioedema    Anxiety    Arthritis    Cough    Depression    Dr. Abner Greenspan   Exposure to hepatitis C    GERD (gastroesophageal reflux disease)    Hyperlipidemia    Hypertension    IBS (irritable bowel syndrome)    Internal and external bleeding hemorrhoids    Migraine    PONV (postoperative nausea and vomiting)    Rosacea     Past Surgical History:  Procedure Laterality Date   ABDOMINOPLASTY  2000   APPENDECTOMY  2004   AUGMENTATION MAMMAPLASTY Bilateral 2011   [place]   x 2   COLONOSCOPY  09/15/2008   internal and external hemorroids   COSMETIC SURGERY     multiple   HEMORRHOID BANDING  2014   REDUCTION MAMMAPLASTY Bilateral 2002   TONSILLECTOMY  1962   TOTAL KNEE ARTHROPLASTY Right 12/26/2015   Procedure: TOTAL KNEE ARTHROPLASTY;  Surgeon: Paralee Cancel, MD;  Location: [facility];  Service: Orthopedics;  Laterality: Right;   TOTAL KNEE ARTHROPLASTY Left 04/25/2021   Procedure: TOTAL KNEE ARTHROPLASTY;  Surgeon: Paralee Cancel, MD;  Location: [facility];  Service: Orthopedics;  Laterality: Left;  70 mins   TOTAL SHOULDER REPLACEMENT Bilateral 2009   right   TOTAL SHOULDER REPLACEMENT     UPPER GASTROINTESTINAL ENDOSCOPY  11/26/2018    Current Outpatient Medications  Medication Instructions   acetaminophen (TYLENOL) 975 mg, Oral, Every 8 hours   ALPRAZolam (XANAX) 0.5 MG tablet alprazolam 0.5 mg  tablet  TAKE 1 TABLET BY MOUTH EVERY DAY AS NEEDED   amoxicillin (AMOXIL) 500 MG capsule amoxicillin 500 mg capsule  TAKE 4 CAPSULES BY MOUTH 1 HOUR PRIOR TO DENTAL WORK   b complex vitamins tablet 1 tablet, Oral, Daily   cetirizine (ZYRTEC) 10 mg, Oral, Daily PRN   cholecalciferol (VITAMIN D) 1,000 Units, Oral, Daily   COLLAGEN PO 1 Scoop, Oral, Daily   cycloSPORINE (RESTASIS) 0.05 % ophthalmic emulsion No dose, route, or frequency recorded.   eletriptan (RELPAX) 20 mg, Oral, As needed, May repeat in 2 hours if headache persists or recurs.   EPINEPHrine (EPIPEN 2-PAK) 0.3 mg/0.3 mL IJ SOAJ injection INJECT 0.3 MLS (0.3 MG TOTAL) INTO THE MUSCLE ONCE.   escitalopram (LEXAPRO) 20 mg, Oral, Daily   esomeprazole (NEXIUM) 20 mg, Oral, Daily   ezetimibe (ZETIA) 10 mg, Oral, Daily   hydrocortisone (ANUSOL-HC) 2.5 % rectal cream 1 application , Rectal, 2 times daily PRN   metFORMIN (GLUCOPHAGE) 500 mg, Oral, 2 times daily with meals   rosuvastatin (CRESTOR) 20 mg, Oral, Daily       Objective:   Physical Exam BP 126/82   Pulse 83   Temp 98.4 F (36.9 C) (Oral)   Resp 16   Ht 5' (1.524 m)   Wt  153 lb 4 oz (69.5 kg)   SpO2 98%   BMI 29.93 kg/m  General:   Well developed, NAD, BMI noted. HEENT:  Normocephalic . Face symmetric, atraumatic Lungs:  CTA B Normal respiratory effort, no intercostal retractions, no accessory muscle use. Heart: RRR,  no murmur.  Lower extremities: no pretibial edema bilaterally  Skin: Not pale. Not jaundice Neurologic:  alert & oriented X3.  Speech normal, gait appropriate for age and unassisted Psych--  Cognition and judgment appear intact.  Cooperative with normal attention span and concentration.  Behavior appropriate. No anxious or depressed appearing.      Assessment    Assessment (transferring to this office due to location 06-2022) Hyperglycemia Depression-anxiety. Sees Dr Toy Care  High cholesterol.  Seen at cardiology GERD Migraines  (reports visual disturbances) DJD: B shoulder replacement; B TKR  ABX pre- dental d/t DJD H/o anaphylaxis- unknown trigger has a epipen ; has seen several specialists  Vit D def Fatigue, dizziness: Chronic per patient  PLAN Hyperglycemia: Started metformin in December, doing better with exercise, + weight loss noted.  Has mild  nausea but no diarrhea, likely a s/e from metformin Plan: Switch metformin to the XR formulation 500 mg twice daily, check BMP LFTs A1c. High cholesterol: Per cardiovascular team. Preventive care: See AVS RTC 6 months CPX

## 2023-01-08 ENCOUNTER — Ambulatory Visit: Payer: Medicare Other | Admitting: Internal Medicine

## 2023-01-08 ENCOUNTER — Other Ambulatory Visit: Payer: Self-pay | Admitting: Internal Medicine

## 2023-01-18 ENCOUNTER — Other Ambulatory Visit: Payer: Self-pay | Admitting: Internal Medicine

## 2023-01-18 DIAGNOSIS — E785 Hyperlipidemia, unspecified: Secondary | ICD-10-CM

## 2023-01-22 LAB — NMR, LIPOPROFILE
Cholesterol, Total: 117 mg/dL (ref 100–199)
HDL Particle Number: 40.5 umol/L (ref >=30.5)
HDL-C: 59 mg/dL (ref >39)
LDL Particle Number: 521 nmol/L (ref <1000)
LDL Size: 19.7 nm — ABNORMAL LOW (ref >20.5)
LDL-C (NIH Calc): 40 mg/dL (ref 0–99)
LP-IR Score: 34 (ref <=45)
Small LDL Particle Number: 326 nmol/L (ref <=527)
Triglycerides: 95 mg/dL (ref 0–149)

## 2023-01-23 ENCOUNTER — Telehealth: Payer: Self-pay | Admitting: Internal Medicine

## 2023-01-23 NOTE — Telephone Encounter (Signed)
Called pt to let her know Dr. Rennis Golden is out of office today and her message will be relayed to her. No answer, left message on machine.

## 2023-01-23 NOTE — Telephone Encounter (Signed)
Left message letting pt know her results are released on MyChart.

## 2023-01-23 NOTE — Telephone Encounter (Signed)
Patient requesting a nurse call her back to go over her recent lab results, since her appointment tomorrow had to be rescheduled. She also would like to know if she needs to do labs again before her new appointment in July.

## 2023-01-24 ENCOUNTER — Ambulatory Visit: Payer: Medicare Other | Admitting: Internal Medicine

## 2023-01-25 ENCOUNTER — Encounter: Payer: Self-pay | Admitting: Internal Medicine

## 2023-03-15 ENCOUNTER — Other Ambulatory Visit: Payer: Self-pay | Admitting: Internal Medicine

## 2023-04-12 ENCOUNTER — Other Ambulatory Visit: Payer: Self-pay | Admitting: Internal Medicine

## 2023-05-08 ENCOUNTER — Ambulatory Visit: Payer: Medicare Other | Admitting: Internal Medicine

## 2023-05-08 ENCOUNTER — Ambulatory Visit: Payer: Medicare Other | Attending: Internal Medicine | Admitting: Internal Medicine

## 2023-05-08 ENCOUNTER — Encounter: Payer: Self-pay | Admitting: Internal Medicine

## 2023-05-08 VITALS — BP 128/84 | HR 75 | Ht 60.0 in | Wt 164.4 lb

## 2023-05-08 DIAGNOSIS — R931 Abnormal findings on diagnostic imaging of heart and coronary circulation: Secondary | ICD-10-CM | POA: Diagnosis not present

## 2023-05-08 DIAGNOSIS — E785 Hyperlipidemia, unspecified: Secondary | ICD-10-CM | POA: Diagnosis not present

## 2023-05-08 NOTE — Progress Notes (Signed)
LIPID CLINIC CONSULT NOTE  Chief Complaint:  Follow-up dyslipidemia  Primary Care Physician: Wanda Plump, MD  Primary Cardiologist:  None  HPI:  Cassandra Little is a 77 y.o. female who is being seen today for the evaluation of dyslipidemia at the request of Wanda Plump, MD. this is a pleasant 77 year old female kindly referred for evaluation management of dyslipidemia.  She has very little past medical history and is generally been healthy.  She exercises regularly and eats a fairly healthy diet.  She does have some heart disease in her family including her mom who had some heart failure in her 61s and her father was diabetic and had an MI at around 1 as well.  Recently she has had an increase in her cholesterol.  Total cholesterol in March was 225, HDL 56, LDL 135 and triglycerides 865.  She has not been on therapy for this.  After discussion with her primary doctor, she started on over-the-counter red yeast rice.  There was a recommendation to refer her to the lipid clinic for further risk stratification.  Other risk factors include a history hypertension which is now controlled without medication.  12/06/2021  Cassandra Little returns today for follow-up of her lipids.  She underwent calcium scoring which demonstrated an elevated calcium score 59.8, 57 percentile for age and sex matched controls.  Based on this she was started on rosuvastatin 20 mg daily for 50% reduction of her lipids however did not achieve this.  Particle numbers went to 1289 from 1619.  LDL now 106 down from 155.  Total cholesterol was 299 down to 187.  This represents less than 50% reduction which would be expected for the medication.  She says that she has been compliant with the medicine.  She also notes no significant changes in her diet, weight gain or less physical activity that could explain this.  Based on that I am concerned there may be an elevation in LP(a) to explain this.  04/13/2022  Cassandra Little returns today  for follow-up.  She is done well on combination of ezetimibe and rosuvastatin.  Her cholesterol profile is markedly improved.  Total cholesterol now 112, triglycerides 55, HDL 61 and LDL 38.  LDL particle number is 618, small LDL particle number is 420.  Overall significant improvement in her lipids.  05/08/2023  Cassandra Little returns today for follow-up.  She has done well on continuing therapy with rosuvastatin and ezetimibe.  Recent lipids showed LDL particle #521, LDL-C of 40, HDL-C is 59 and triglycerides were 95.  Total cholesterol 117.  Small LDL particle #326.  She has no side effects from the medications.  She remains physically active.  She is highly involved in yoga and says she wants to start more exertional physical activity.  I have no concerns about this.  Although she had some coronary calcium advised her to start exercise slowly and try to workup increased with that.  She should try to target heart rate around 110-120 based on her age and the Bruce formula.  PMHx:  Past Medical History:  Diagnosis Date   Anaphylaxis 02/14/2011   Unknown cause.  Always carries Epipen.   Angioedema    Anxiety    Arthritis    Cough    Depression    Dr. Raquel James   Exposure to hepatitis C    GERD (gastroesophageal reflux disease)    Hyperlipidemia    Hypertension    IBS (irritable bowel syndrome)  Internal and external bleeding hemorrhoids    Migraine    PONV (postoperative nausea and vomiting)    Rosacea     Past Surgical History:  Procedure Laterality Date   ABDOMINOPLASTY  2000   APPENDECTOMY  2004   AUGMENTATION MAMMAPLASTY Bilateral 2011   CESAREAN SECTION  1982, 1985   x 2   COLONOSCOPY  09/15/2008   internal and external hemorroids   COSMETIC SURGERY     multiple   HEMORRHOID BANDING  2014   REDUCTION MAMMAPLASTY Bilateral 2002   TONSILLECTOMY  1962   TOTAL KNEE ARTHROPLASTY Right 12/26/2015   Procedure: TOTAL KNEE ARTHROPLASTY;  Surgeon: Durene Romans, MD;  Location: [facility];   Service: Orthopedics;  Laterality: Right;   TOTAL KNEE ARTHROPLASTY Left 04/25/2021   Procedure: TOTAL KNEE ARTHROPLASTY;  Surgeon: Durene Romans, MD;  Location: [facility];  Service: Orthopedics;  Laterality: Left;  70 mins   TOTAL SHOULDER REPLACEMENT Bilateral 2009   right   TOTAL SHOULDER REPLACEMENT     UPPER GASTROINTESTINAL ENDOSCOPY  11/26/2018    FAMHx:  Family History  Problem Relation Age of Onset   Hyperlipidemia Mother    Heart disease Mother    Allergic rhinitis Mother    Eczema Mother    Diabetes Father    Hypertension Father    Heart attack Father        61s   Heart disease Father    Obesity Sister    Hypertension Sister    Hyperlipidemia Sister    Colon cancer Neg Hx    Stomach cancer Neg Hx    Rectal cancer Neg Hx    Pancreatic cancer Neg Hx    Esophageal cancer Neg Hx    Angioedema Neg Hx    Asthma Neg Hx    Atopy Neg Hx    Immunodeficiency Neg Hx    Urticaria Neg Hx    Breast cancer Neg Hx     SOCHx:   reports that she has never smoked. She has never used smokeless tobacco. She reports current alcohol use of about 1.0 standard drink of alcohol per week. She reports that she does not use drugs.  ALLERGIES:  Allergies  Allergen Reactions   Ace Inhibitors Cough    ROS: Pertinent items noted in HPI and remainder of comprehensive ROS otherwise negative.  HOME MEDS: Current Outpatient Medications on File Prior to Visit  Medication Sig Dispense Refill   acetaminophen (TYLENOL) 325 MG tablet Take 3 tablets (975 mg total) by mouth every 8 (eight) hours. (Patient taking differently: Take 1,000 mg by mouth as needed.)     ALPRAZolam (XANAX) 0.5 MG tablet alprazolam 0.5 mg tablet  TAKE 1 TABLET BY MOUTH EVERY DAY AS NEEDED     b complex vitamins tablet Take 1 tablet by mouth daily.     cetirizine (ZYRTEC) 10 MG tablet Take 10 mg by mouth daily as needed for allergies.     cholecalciferol (VITAMIN D) 1000 units tablet Take 1,000 Units by mouth daily.      COLLAGEN PO Take 1 Scoop by mouth daily.     cycloSPORINE (RESTASIS) 0.05 % ophthalmic emulsion      eletriptan (RELPAX) 20 MG tablet Take 1 tablet (20 mg total) by mouth as needed for migraine or headache. May repeat in 2 hours if headache persists or recurs. 10 tablet 5   escitalopram (LEXAPRO) 20 MG tablet Take 20 mg by mouth daily.     esomeprazole (NEXIUM) 20 MG capsule Take 20  mg by mouth daily.     ezetimibe (ZETIA) 10 MG tablet TAKE 1 TABLET BY MOUTH DAILY 90 tablet 1   hydrocortisone (ANUSOL-HC) 2.5 % rectal cream Place 1 application rectally 2 (two) times daily as needed for hemorrhoids or anal itching.     metFORMIN (GLUCOPHAGE-XR) 500 MG 24 hr tablet TAKE 1 TABLET BY MOUTH TWICE  DAILY WITH A MEAL 180 tablet 3   rosuvastatin (CRESTOR) 20 MG tablet TAKE 1 TABLET BY MOUTH DAILY 90 tablet 3   amoxicillin (AMOXIL) 500 MG capsule amoxicillin 500 mg capsule  TAKE 4 CAPSULES BY MOUTH 1 HOUR PRIOR TO DENTAL WORK (Patient not taking: Reported on 01/07/2023)     EPINEPHrine (EPIPEN 2-PAK) 0.3 mg/0.3 mL IJ SOAJ injection INJECT 0.3 MLS (0.3 MG TOTAL) INTO THE MUSCLE ONCE. (Patient not taking: Reported on 01/07/2023) 2 each 1   No current facility-administered medications on file prior to visit.    LABS/IMAGING: No results found for this or any previous visit (from the past 48 hour(s)). No results found.  LIPID PANEL:    Component Value Date/Time   CHOL 225 (H) 12/27/2020 1307   CHOL 195 08/15/2018 1709   TRIG 170.0 (H) 12/27/2020 1307   HDL 56.00 12/27/2020 1307   HDL 54 08/15/2018 1709   CHOLHDL 4 12/27/2020 1307   VLDL 34.0 12/27/2020 1307   LDLCALC 135 (H) 12/27/2020 1307   LDLCALC 120 (H) 08/15/2018 1709   LDLDIRECT 146.0 11/09/2015 1351    WEIGHTS: Wt Readings from Last 3 Encounters:  05/08/23 164 lb 6.4 oz (74.6 kg)  01/07/23 153 lb 4 oz (69.5 kg)  10/09/22 160 lb 12.8 oz (72.9 kg)    VITALS: BP 128/84   Pulse 75   Ht 5' (1.524 m)   Wt 164 lb 6.4 oz (74.6 kg)    SpO2 95%   BMI 32.11 kg/m   EXAM: Deferred  EKG: Deferred  ASSESSMENT: Mixed dyslipidemia 10-year ASCVD risk of 11.1% by pooled cohort equation Family history of later onset coronary disease/CHF in both parents Elevated CAC score of 59.8, 57th percentile (07/2021)  PLAN: 1.   Cassandra Little to do well with good cholesterol control on combination therapy.  She denies any chest pain or worsening shortness of breath.  She wants to start more aerobic activity in addition to her yoga.  I have no hesitation with this.  She should monitor for any symptoms and contact me if there is concern but otherwise continue with her current therapies.  Will plan to see her back annually or sooner as necessary.  Chrystie Nose, MD, [facility], FACP  [place]  [facility]  Medical Director of the Advanced Lipid Disorders &  Cardiovascular Risk Reduction Clinic Diplomate of the American Board of Clinical Lipidology Attending Cardiologist  Direct Dial: [phone]  Fax: [phone]  Website:  [email or web address] 05/08/2023, 9:56 AM

## 2023-05-08 NOTE — Patient Instructions (Signed)
Medication Instructions:   Your physician recommends that you continue on your current medications as directed. Please refer to the Current Medication list given to you today.   *If you need a refill on your cardiac medications before your next appointment, please call your pharmacy*   Lab Work:  None ordered.  If you have labs (blood work) drawn today and your tests are completely normal, you will receive your results only by: MyChart Message (if you have MyChart) OR A paper copy in the mail If you have any lab test that is abnormal or we need to change your treatment, we will call you to review the results.   Testing/Procedures:  None ordered.   Follow-Up: At [facility], you and your health needs are our priority.  As part of our continuing mission to provide you with exceptional heart care, we have created designated Provider Care Teams.  These Care Teams include your primary Cardiologist (physician) and Advanced Practice Providers (APPs -  Physician Assistants and Nurse Practitioners) who all work together to provide you with the care you need, when you need it.  We recommend signing up for the patient portal called "MyChart".  Sign up information is provided on this After Visit Summary.  MyChart is used to connect with patients for Virtual Visits (Telemedicine).  Patients are able to view lab/test results, encounter notes, upcoming appointments, etc.  Non-urgent messages can be sent to your provider as well.   To learn more about what you can do with MyChart, go to [email or web address].    Your next appointment:   1 year(s)  Provider:   Italy Hilty, MD     Other Instructions  Your physician wants you to follow-up in: 1 year with Dr. Rennis Golden.  You will receive a reminder letter in the mail two months in advance. If you don't receive a letter, please call our office to schedule the follow-up appointment.

## 2023-06-22 IMAGING — CT CT CARDIAC CORONARY ARTERY CALCIUM SCORE
3 series · 14 of 20 positions shown, 16 images · non-contrast
Comparison: None.
COMPARISON: None.

Addendum:
EXAM:
OVER-READ INTERPRETATION  CT CHEST

The following report is an over-read performed by radiologist Dr.
Savio Locklear [REDACTED] on 08/08/2021. This
over-read does not include interpretation of cardiac or coronary
anatomy or pathology. The coronary calcium score interpretation by
the cardiologist is attached.
CLINICAL DATA: 75F for cardiovascular disease risk stratification
Coronary Calcium Score
TECHNIQUE: A gated, non-contrast computed tomography scan of the heart was
performed using 3mm slice thickness. Axial images were analyzed on a
dedicated workstation. Calcium scoring of the coronary arteries was
performed using the Agatston method.

[Series 2: ax lung · axial · 0.81mm/px · z∈[+1131,+1221]mm · 5 of 69 slices shown]
[im 12/69  lung]
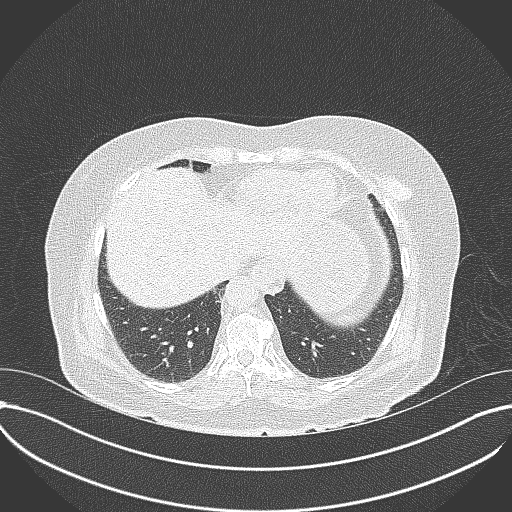
[im 23/69  lung]
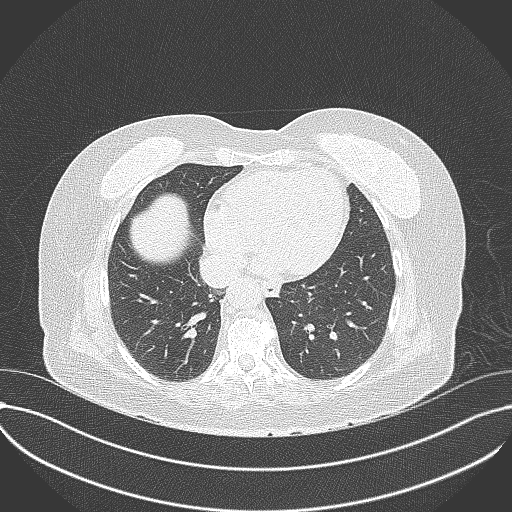
[im 35/69  lung]
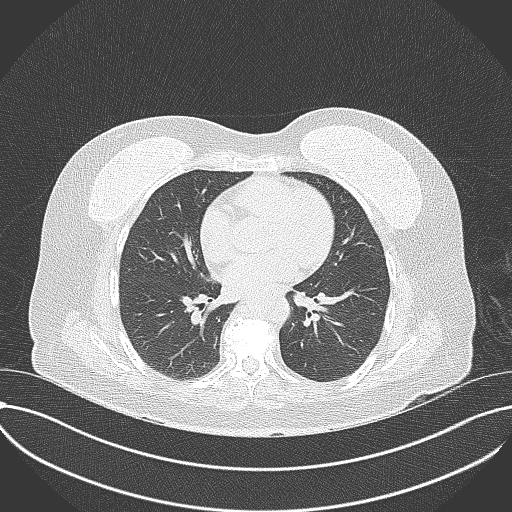
[im 46/69  lung]
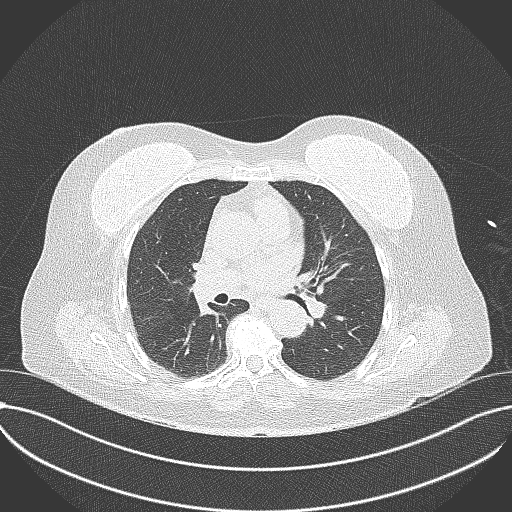
[im 57/69  lung]
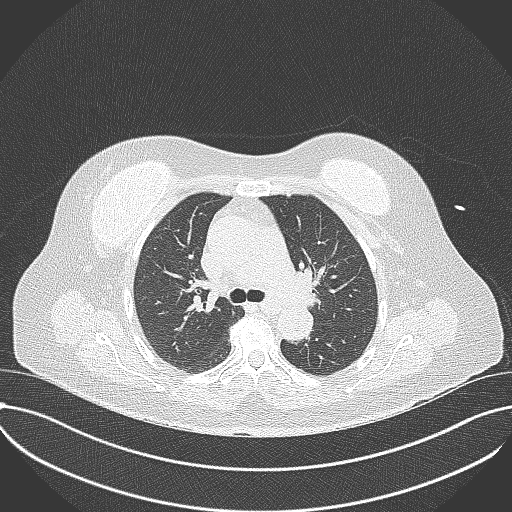

[Series 3: cascseq 3.0 sa36 70% (id) · axial · 0.39mm/px · z∈[+1143,+1209]mm · 3 of 46 slices shown]
[im 12/46  vessel]
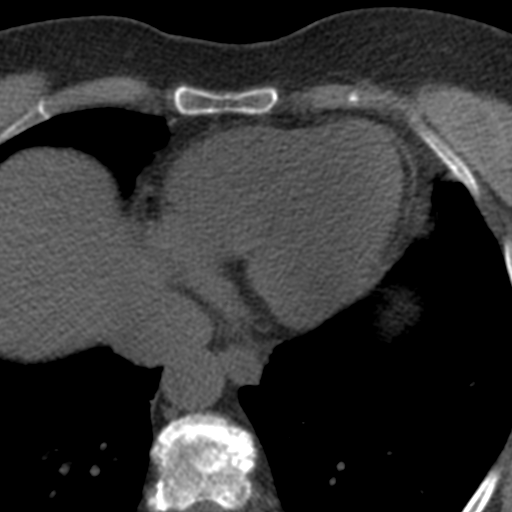
[im 23/46  vessel]
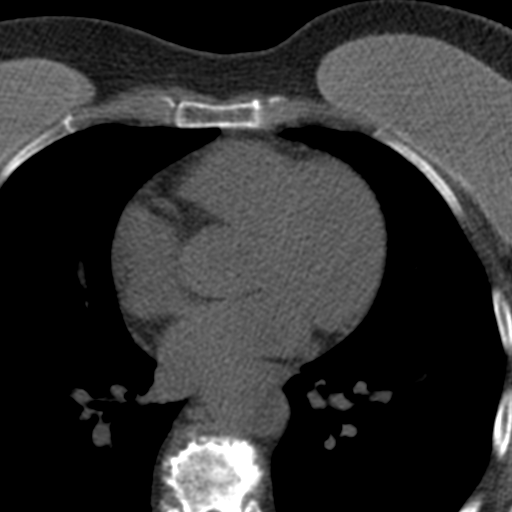
[im 34/46  vessel]
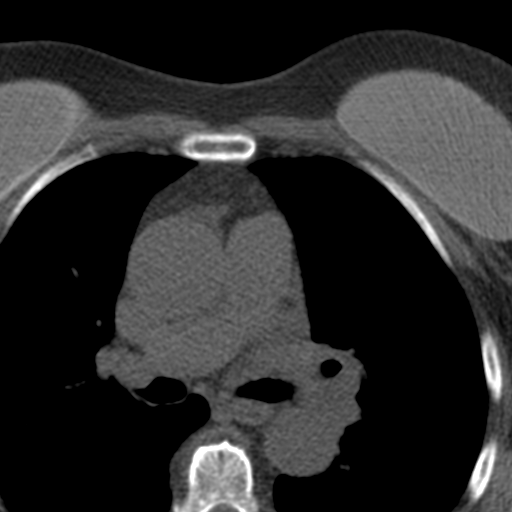

[Series 4: ax st · axial · 0.69mm/px · z∈[+1127,+1225]mm · 6 of 69 slices shown, 8 images]
[im 10/69  vessel]
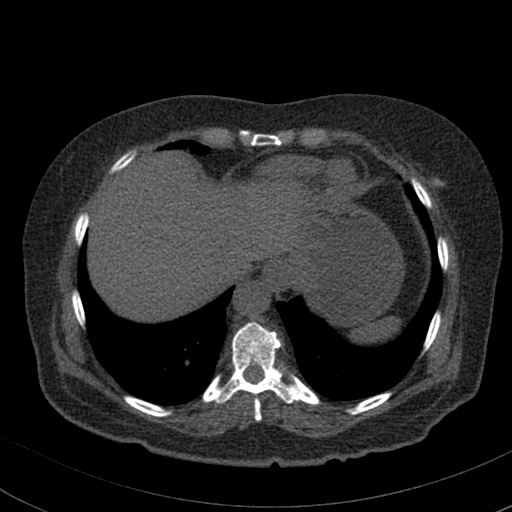
[im 10/69  lung]
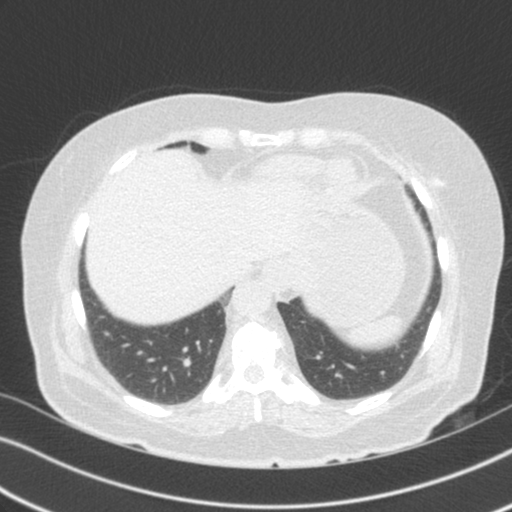
[im 20/69  vessel]
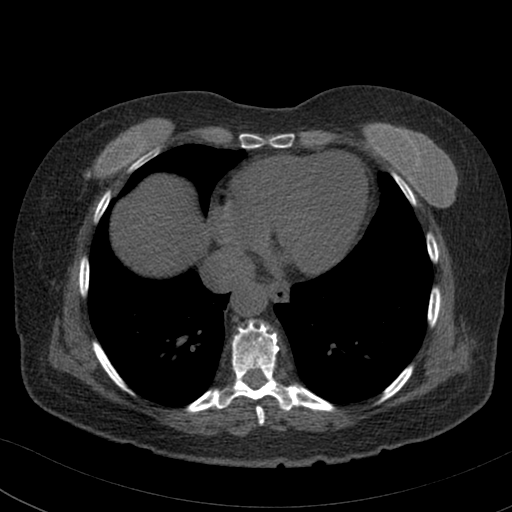
[im 30/69  vessel]
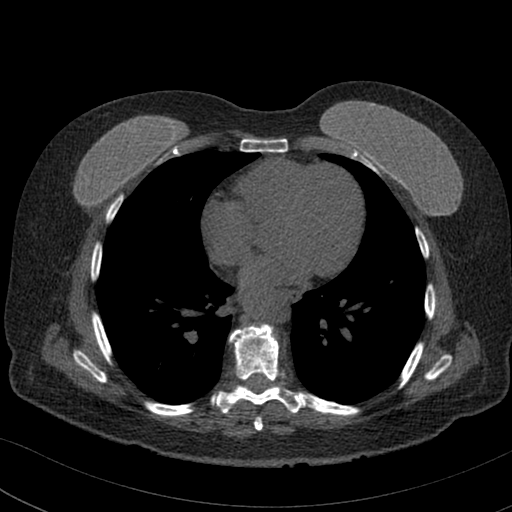
[im 39/69  vessel]
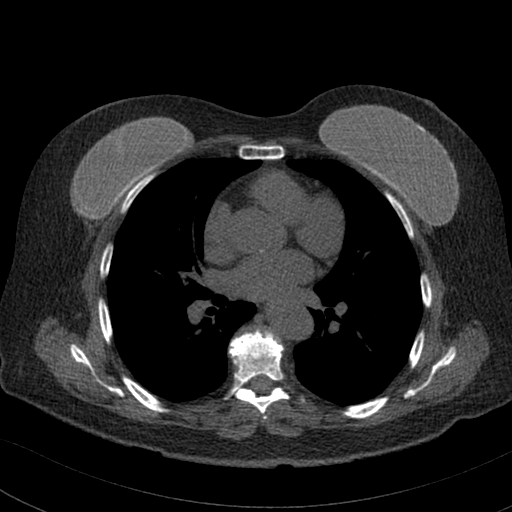
[im 49/69  vessel]
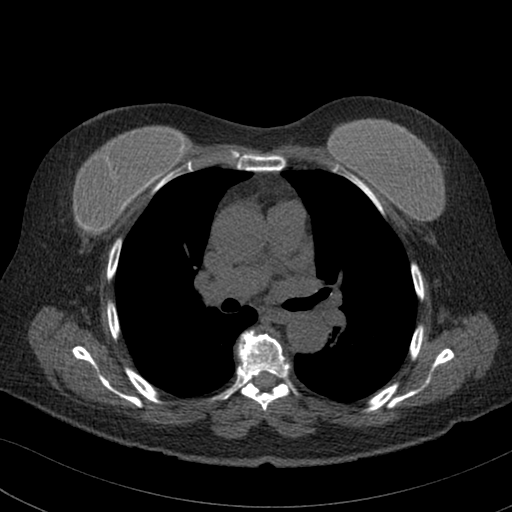
[im 49/69  lung]
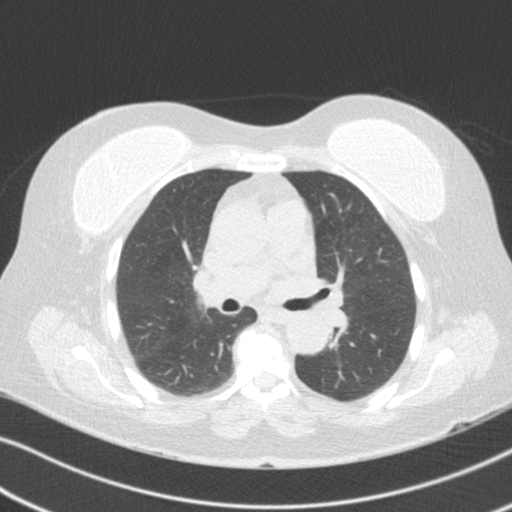
[im 59/69  vessel]
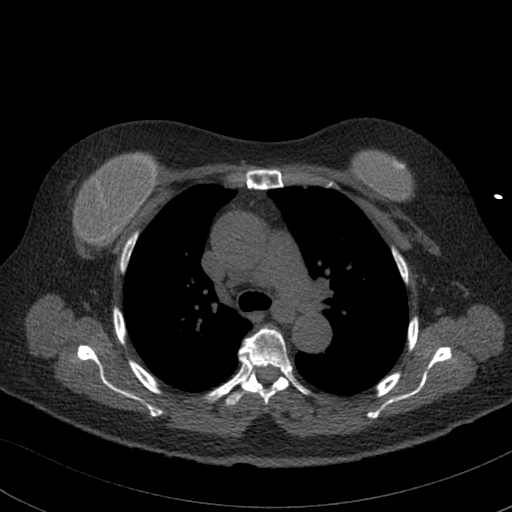

[14 of 20 positions shown; findings below may reference images not displayed]

FINDINGS: Vascular: Descending thoracic aorta is moderately tortuous without
aneurysmal disease.

Mediastinum/Nodes: Visualized mediastinum and hilar regions
demonstrate no lymphadenopathy or masses.

Lungs/Pleura: Visualized lungs show no evidence of pulmonary edema,
consolidation, pneumothorax, nodule or pleural fluid.

Upper Abdomen: No acute abnormality.

Musculoskeletal: No chest wall mass or suspicious bone lesions
identified.
IMPRESSION: Tortuosity of the thoracic aorta without visible aneurysmal disease.
FINDINGS: Coronary arteries: Normal origins.

Coronary Calcium Score:

Left main: 0

Left anterior descending artery:

Left circumflex artery: 0

Right coronary artery: 0

Total:

Percentile: 57th

Pericardium: Normal.

Ascending Aorta: Mildly dilated. 3.7 cm. Atherosclerosis of the
aortic arch.

Non-cardiac: See separate report from [REDACTED].
IMPRESSION: Coronary calcium score of 59.8. This was 57th percentile for age-,
race-, and sex-matched controls.

Ascending aorta mildly dilated. 3.7 cm. Atherosclerosis of the
aortic arch.



If CAC=0, it is reasonable to withhold statin therapy and reassess
in 5 to 10 years, as long as higher risk conditions are absent
(diabetes mellitus, family history of premature CHD in first degree
relatives (males <55 years; females <65 years), cigarette smoking,
or LDL >=190 mg/dL).

If CAC is 1 to 99, it is reasonable to initiate statin therapy for
patients >=55 years of age.

If CAC is >=100 or >=75th percentile, it is reasonable to initiate
statin therapy at any age.

Cardiology referral should be considered for patients with CAC
scores >=400 or >=75th percentile.

*4839 AHA/ACC/AACVPR/AAPA/ABC/JULLY/STEN-INGE/PERTUNIA/Arissa/JUMPER/BEGICI/THIMMAPPA
Guideline on the Management of Blood Cholesterol: A Report of the
American College of Cardiology/American Heart Association Task Force
on Clinical Practice Guidelines. J Am Coll Cardiol.
6060;73(24):5468-5974.

*** End of Addendum ***
EXAM:
OVER-READ INTERPRETATION  CT CHEST

The following report is an over-read performed by radiologist Dr.
Savio Locklear [REDACTED] on 08/08/2021. This
over-read does not include interpretation of cardiac or coronary
anatomy or pathology. The coronary calcium score interpretation by
the cardiologist is attached.
FINDINGS: Vascular: Descending thoracic aorta is moderately tortuous without
aneurysmal disease.

Mediastinum/Nodes: Visualized mediastinum and hilar regions
demonstrate no lymphadenopathy or masses.

Lungs/Pleura: Visualized lungs show no evidence of pulmonary edema,
consolidation, pneumothorax, nodule or pleural fluid.

Upper Abdomen: No acute abnormality.

Musculoskeletal: No chest wall mass or suspicious bone lesions
identified.
IMPRESSION: Tortuosity of the thoracic aorta without visible aneurysmal disease.

## 2023-07-11 ENCOUNTER — Ambulatory Visit: Payer: Medicare Other | Admitting: Psychiatry

## 2023-07-11 ENCOUNTER — Encounter: Payer: Self-pay | Admitting: Psychiatry

## 2023-07-11 VITALS — BP 148/84 | HR 70 | Ht 60.0 in | Wt 165.0 lb

## 2023-07-11 DIAGNOSIS — F32A Depression, unspecified: Secondary | ICD-10-CM | POA: Diagnosis not present

## 2023-07-11 DIAGNOSIS — F411 Generalized anxiety disorder: Secondary | ICD-10-CM | POA: Diagnosis not present

## 2023-07-11 MED ORDER — BUSPIRONE HCL 15 MG PO TABS
ORAL_TABLET | ORAL | 0 refills | Status: DC
Start: 2023-07-11 — End: 2023-09-06

## 2023-07-11 MED ORDER — ESCITALOPRAM OXALATE 20 MG PO TABS: 20.0000 mg | ORAL_TABLET | Freq: Every day | ORAL | 0 refills | Status: DC

## 2023-07-11 NOTE — Progress Notes (Signed)
[facility] MD/PA/NP Initial Note  07/12/2023 5:14 PM Cassandra Little  MRN:  [id number]  Chief Complaint:  Chief Complaint   Anxiety; Depression     HPI: Pt is a 77 yo female being seen for initial evaluation for anxiety and depression. She reports that several weeks ago she started feeling depressed, "very shaky and crying a lot." She reports that this prompted her to seek evaluation to determine if her medications may need to be changed. She reports that she later realized she had inadvertently missed several days of Lexapro and crying and shaking improved after she re-started Lexapro.   She reports lifelong depression and recalls noticing depression in childhood. Denies current depressed mood. She reports that her motivation and energy are good. Denies difficulty falling or staying asleep. Sleeps 7-8 hours on average. Denies diminished interest and enjoyment in things. Appetite is good. Reports that she lost weight while taking Metformin and gained weight after stopping Metformin due to nausea. Adequate concentration. Denies current or past SI.   She reports that she had excessive worry in childhood. She reports that she would worry about money as a child. Reports that she has always been "shy and worried about other people's judgment." She describes herself as an "empath." She reports watching the news causes her distress and she has been trying to avoid watching the news. Denies compulsions. She reports some worry and rumination about her children. She noticed physical symptoms when she was without medication. Denies panic attacks.   She reports that Prozac helped her with public speaking. She reports feeling scrutinized. She is no longer anxious in crowds or public speaking.   Denies decreased need for sleep or increased goal-directed activity. She reports that she had increased energy on Prozac. She reports occasional "highs" in years past for no longer than one day and describes these times  as mood being higher than usual when anxiety was less.   She reports that she has increased difficulty making decisions when depressed. She denies any AH or VH. Denies paranoia.   Born and raised in [place]. She reports that she had "wonderful, loving parents."   Completed graduate school. Has masters degree in Investment banker, corporate and adult learning. Worked for [employer]. She was an Medical sales representative. Retired 8 years ago. 2 children. Son is a [place] professor and daughter is a Runner, broadcasting/film/video. She has 3 grandchildren. Sons' wife has a recurrence of cancer. Both husbands were scientist. Currently not married. She divorced several years ago.   She enjoys yoga and pilates. She goes on yoga retreats. She reports that her faith and spiritual life are important to her. Enjoys travel and meeting people.   Past Psychiatric Medication Trials: Prozac: Took for 20 years.  Lexapro- Seemed to be ok.  Wellbutrin XL- No improvement or side effects.  Xanax  Visit Diagnosis:    ICD-10-CM   1. Depression, unspecified depression type  F32.A escitalopram (LEXAPRO) 20 MG tablet    2. Generalized anxiety disorder  F41.1 busPIRone (BUSPAR) 15 MG tablet    escitalopram (LEXAPRO) 20 MG tablet      Past Psychiatric History: Has seen Dr. Evelene Croon. Last saw her over a year ago. Has seen Rockne Menghini, LCSW. Currently not seeing a therapist. Denies pas psychiatric hospitalization.   Past Medical History:  Past Medical History:  Diagnosis Date   Anaphylaxis 02/14/2011   Unknown cause.  Always carries Epipen.   Angioedema    Anxiety    Arthritis    Cough  Depression    Dr. Raquel James   Exposure to hepatitis C    GERD (gastroesophageal reflux disease)    Hyperlipidemia    Hypertension    IBS (irritable bowel syndrome)    Internal and external bleeding hemorrhoids    Migraine    PONV (postoperative nausea and vomiting)    Rosacea     Past Surgical History:  Procedure Laterality Date   ABDOMINOPLASTY   2000   APPENDECTOMY  2004   AUGMENTATION MAMMAPLASTY Bilateral 2011   CESAREAN SECTION  1982, 1985   x 2   COLONOSCOPY  09/15/2008   internal and external hemorroids   COSMETIC SURGERY     multiple   HEMORRHOID BANDING  2014   REDUCTION MAMMAPLASTY Bilateral 2002   TONSILLECTOMY  1962   TOTAL KNEE ARTHROPLASTY Right 12/26/2015   Procedure: TOTAL KNEE ARTHROPLASTY;  Surgeon: Durene Romans, MD;  Location: [facility];  Service: Orthopedics;  Laterality: Right;   TOTAL KNEE ARTHROPLASTY Left 04/25/2021   Procedure: TOTAL KNEE ARTHROPLASTY;  Surgeon: Durene Romans, MD;  Location: [facility];  Service: Orthopedics;  Laterality: Left;  70 mins   TOTAL SHOULDER REPLACEMENT Bilateral 2009   right   TOTAL SHOULDER REPLACEMENT     UPPER GASTROINTESTINAL ENDOSCOPY  11/26/2018    Family History:  Family History  Problem Relation Age of Onset   Hyperlipidemia Mother    Heart disease Mother    Allergic rhinitis Mother    Eczema Mother    Diabetes Father    Hypertension Father    Heart attack Father        22s   Heart disease Father    Obesity Sister    Hypertension Sister    Hyperlipidemia Sister    Anxiety disorder Daughter    Depression Daughter    Depression Son    Colon cancer Neg Hx    Stomach cancer Neg Hx    Rectal cancer Neg Hx    Pancreatic cancer Neg Hx    Esophageal cancer Neg Hx    Angioedema Neg Hx    Asthma Neg Hx    Atopy Neg Hx    Immunodeficiency Neg Hx    Urticaria Neg Hx    Breast cancer Neg Hx     Social History:  Social History   Socioeconomic History   Marital status: Divorced    Spouse name: Not on file   Number of children: 2   Years of education: Not on file   Highest education level: Not on file  Occupational History   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[employer]    Employer: [employer]    Comment: Retired  Tobacco Use   Smoking status: Never   Smokeless tobacco: Never  Vaping Use   Vaping status: Never Used  Substance and Sexual Activity   Alcohol use: Yes     Comment: Infrequently   Drug use: No   Sexual activity: Not Currently  Other Topics Concern   Not on file  Social History Narrative   She is divorced and retired from [employer]   1 cup of coffee and frequent tea consumption   Daughter lives in area [place] teacher   Son professor [employer]   1 drink a week no drugs no tobacco   Social Determinants of Health   Financial Resource Strain: Low Risk  (03/20/2021)   Overall Financial Resource Strain (CARDIA)    Difficulty of Paying Living Expenses: Not very hard  Food Insecurity: No Food Insecurity (10/10/2022)   Hunger Vital Sign  Worried About Programme researcher, broadcasting/film/video in the Last Year: Never true    Ran Out of Food in the Last Year: Never true  Transportation Needs: No Transportation Needs (10/10/2022)   PRAPARE - Administrator, Civil Service (Medical): No    Lack of Transportation (Non-Medical): No  Physical Activity: Sufficiently Active (03/20/2021)   Exercise Vital Sign    Days of Exercise per Week: 6 days    Minutes of Exercise per Session: 40 min  Stress: No Stress Concern Present (03/20/2021)   [employer] of Occupational Health - Occupational Stress Questionnaire    Feeling of Stress : Not at all  Social Connections: Moderately Integrated (03/20/2021)   Social Connection and Isolation Panel [NHANES]    Frequency of Communication with Friends and Family: More than three times a week    Frequency of Social Gatherings with Friends and Family: More than three times a week    Attends Religious Services: More than 4 times per year    Active Member of [employer] or Organizations: Yes    Attends Engineer, structural: More than 4 times per year    Marital Status: Divorced    Allergies:  Allergies  Allergen Reactions   Ace Inhibitors Cough    Metabolic Disorder Labs: Lab Results  Component Value Date   HGBA1C 6.1 01/07/2023   No results found for: "PROLACTIN" Lab Results  Component Value Date   CHOL 225 (H) 12/27/2020    TRIG 170.0 (H) 12/27/2020   HDL 56.00 12/27/2020   CHOLHDL 4 12/27/2020   VLDL 34.0 12/27/2020   LDLCALC 135 (H) 12/27/2020   LDLCALC 120 (H) 08/15/2018   Lab Results  Component Value Date   TSH 2.24 07/09/2022   TSH 1.81 12/27/2020    Therapeutic Level Labs: No results found for: "LITHIUM" No results found for: "VALPROATE" No results found for: "CBMZ"  Current Medications: Current Outpatient Medications  Medication Sig Dispense Refill   b complex vitamins tablet Take 1 tablet by mouth daily.     busPIRone (BUSPAR) 15 MG tablet Take 1/3 tablet p.o. twice daily for 1 week, then take 2/3 tablet p.o. twice daily for 1 week, then take 1 tablet p.o. twice daily 180 tablet 0   cetirizine (ZYRTEC) 10 MG tablet Take 10 mg by mouth daily as needed for allergies.     cholecalciferol (VITAMIN D) 1000 units tablet Take 1,000 Units by mouth daily.     COLLAGEN PO Take 1 Scoop by mouth daily.     esomeprazole (NEXIUM) 20 MG capsule Take 20 mg by mouth daily.     ezetimibe (ZETIA) 10 MG tablet TAKE 1 TABLET BY MOUTH DAILY 90 tablet 1   naproxen sodium (ALEVE) 220 MG tablet Take 220 mg by mouth.     rosuvastatin (CRESTOR) 20 MG tablet TAKE 1 TABLET BY MOUTH DAILY 90 tablet 3   ALPRAZolam (XANAX) 0.5 MG tablet alprazolam 0.5 mg tablet  TAKE 1 TABLET BY MOUTH EVERY DAY AS NEEDED (Patient not taking: Reported on 07/11/2023)     amoxicillin (AMOXIL) 500 MG capsule amoxicillin 500 mg capsule  TAKE 4 CAPSULES BY MOUTH 1 HOUR PRIOR TO DENTAL WORK (Patient not taking: Reported on 01/07/2023)     cycloSPORINE (RESTASIS) 0.05 % ophthalmic emulsion  (Patient not taking: Reported on 07/11/2023)     eletriptan (RELPAX) 20 MG tablet Take 1 tablet (20 mg total) by mouth as needed for migraine or headache. May repeat in 2 hours if headache persists  or recurs. 10 tablet 5   EPINEPHrine (EPIPEN 2-PAK) 0.3 mg/0.3 mL IJ SOAJ injection INJECT 0.3 MLS (0.3 MG TOTAL) INTO THE MUSCLE ONCE. (Patient not taking: Reported on  01/07/2023) 2 each 1   escitalopram (LEXAPRO) 20 MG tablet Take 1 tablet (20 mg total) by mouth daily. 90 tablet 0   hydrocortisone (ANUSOL-HC) 2.5 % rectal cream Place 1 application rectally 2 (two) times daily as needed for hemorrhoids or anal itching.     metFORMIN (GLUCOPHAGE-XR) 500 MG 24 hr tablet TAKE 1 TABLET BY MOUTH TWICE  DAILY WITH A MEAL (Patient not taking: Reported on 07/11/2023) 180 tablet 3   No current facility-administered medications for this visit.    Medication Side Effects: none  Orders placed this visit:  No orders of the defined types were placed in this encounter.   Psychiatric Specialty Exam:  Review of Systems  Constitutional: Negative.   HENT: Negative.    Eyes: Negative.   Respiratory: Negative.    Cardiovascular: Negative.   Gastrointestinal:  Positive for abdominal pain, diarrhea and nausea.  Endocrine: Negative.   Genitourinary: Negative.   Musculoskeletal:  Positive for arthralgias and back pain.  Skin: Negative.   Allergic/Immunologic: Negative.   Neurological:  Positive for dizziness, weakness and headaches.  Hematological: Negative.   Psychiatric/Behavioral:         Please refer to HPI    Blood pressure (!) 148/84, pulse 70, height 5' (1.524 m), weight 165 lb (74.8 kg).Body mass index is 32.22 kg/m.  General Appearance: Neat and Well Groomed  Eye Contact:  Good  Speech:  Clear and Coherent, Normal Rate, and Talkative  Volume:  Normal  Mood:  Anxious  Affect:  Appropriate, Congruent, and Anxious  Thought Process:  Coherent, Goal Directed, and Descriptions of Associations: Intact  Orientation:  Full (Time, Place, and Person)  Thought Content: Logical and Hallucinations: None   Suicidal Thoughts:  No  Homicidal Thoughts:  No  Memory:  WNL  Judgement:  Good  Insight:  Good  Psychomotor Activity:  Normal  Concentration:  Concentration: Fair and Attention Span: Good  Recall:  Good  Fund of Knowledge: Good  Language: Good  Assets:   Communication Skills Desire for Improvement Financial Resources/Insurance Resilience Social Support  ADL's:  Intact  Cognition: WNL  Prognosis:  Good   Screenings:  PHQ2-9    Flowsheet Row Office Visit from 01/07/2023 in [facility] Clinical Support from 10/10/2022 in [facility] Office Visit from 07[facility]09/2022 in [facility] Office Visit from 04/11/2021 in [facility] Clinical Support from 03/20/2021 in [facility] Total Score 0 0 0 0 0  PHQ-9 Total Score 0 -- 0 0 --      Flowsheet Row Admission (Discharged) from 04/25/2021 in [facility] Pre-Admission Testing 60 from 04/12/2021 in [facility]  C-SSRS RISK CATEGORY No Risk No Risk       Receiving Psychotherapy: No   Treatment Plan/Recommendations: Pt seen for 70 minutes and time spent discussing history, current and past symptoms and past medication trials. Pt reports that her depression is currently controlled with resuming Lexapro 20 mg daily consistently, however she reports that she continues to have anxiety that is not well controlled. Discussed option to either switch Lexapro to another medication or consider augmentation for anxiety. Discussed  potential benefits, risks, and side effects of BuSpar.  Patient agrees to trial of BuSpar.  Will start BuSpar 15 mg 1/3 tablet twice daily for 1 week, then increase to 2/3 tablet twice daily for 1 week, then increase to 1 tablet twice daily for anxiety. Discussed considering changing Lexapro to a different SSRI if no improvement in anxiety with buspar. Discussed that it may be helpful to re-start therapy since most of her anxiety is related to her adult children. Pt reports that she will consider this. Pt to follow-up with  this provider in 8 weeks or sooner if clinically indicated.    Corie Chiquito, PMHNP

## 2023-07-19 ENCOUNTER — Other Ambulatory Visit: Payer: Self-pay | Admitting: Internal Medicine

## 2023-07-19 DIAGNOSIS — Z1212 Encounter for screening for malignant neoplasm of rectum: Secondary | ICD-10-CM

## 2023-07-19 DIAGNOSIS — Z1211 Encounter for screening for malignant neoplasm of colon: Secondary | ICD-10-CM

## 2023-08-20 ENCOUNTER — Other Ambulatory Visit: Payer: Self-pay | Admitting: Internal Medicine

## 2023-08-22 ENCOUNTER — Encounter: Payer: Self-pay | Admitting: Internal Medicine

## 2023-08-28 ENCOUNTER — Encounter: Payer: Self-pay | Admitting: Psychiatry

## 2023-09-04 ENCOUNTER — Other Ambulatory Visit: Payer: Self-pay | Admitting: Psychiatry

## 2023-09-04 DIAGNOSIS — F32A Depression, unspecified: Secondary | ICD-10-CM

## 2023-09-04 DIAGNOSIS — F411 Generalized anxiety disorder: Secondary | ICD-10-CM

## 2023-09-05 NOTE — Telephone Encounter (Signed)
Has appt. tomorrow

## 2023-09-06 ENCOUNTER — Ambulatory Visit: Payer: Medicare Other | Admitting: Psychiatry

## 2023-09-06 ENCOUNTER — Encounter: Payer: Self-pay | Admitting: Psychiatry

## 2023-09-06 DIAGNOSIS — F32A Depression, unspecified: Secondary | ICD-10-CM

## 2023-09-06 DIAGNOSIS — F411 Generalized anxiety disorder: Secondary | ICD-10-CM | POA: Diagnosis not present

## 2023-09-06 MED ORDER — ALPRAZOLAM 0.5 MG PO TABS: 0.5000 mg | ORAL_TABLET | Freq: Every day | ORAL | 1 refills | Status: DC | PRN

## 2023-09-06 MED ORDER — BUSPIRONE HCL 15 MG PO TABS
15.0000 mg | ORAL_TABLET | Freq: Two times a day (BID) | ORAL | 0 refills | Status: DC
Start: 2023-09-06 — End: 2023-11-19

## 2023-09-06 MED ORDER — ESCITALOPRAM OXALATE 20 MG PO TABS
ORAL_TABLET | ORAL | Status: DC
Start: 2023-09-06 — End: 2023-11-19

## 2023-09-06 MED ORDER — DULOXETINE HCL 60 MG PO CPEP: 60.0000 mg | ORAL_CAPSULE | Freq: Every day | ORAL | 0 refills | Status: DC

## 2023-09-06 MED ORDER — DULOXETINE HCL 30 MG PO CPEP
ORAL_CAPSULE | ORAL | 0 refills | Status: DC
Start: 2023-09-06 — End: 2023-10-25

## 2023-09-06 NOTE — Patient Instructions (Signed)
Week 1: Decrease Lexapro to 20 mg 1/2 tablet daily   Start Duloxetine (Cymbalta) 30 mg daily in the morning  Week 2: Stop Lexapro   Increase Duloxetine (Cymbalta) to 60 mg daily in the morning  A script for the 60 mg capsules is on file at your pharmacy. Please call pharmacy to request they fill the script on file when you are running low on the 30 mg capsules.

## 2023-09-06 NOTE — Progress Notes (Signed)
Cassandra Little [id number] 06-23-1946 77 y.o.  Subjective:   Patient ID:  Cassandra Little is a 77 y.o. (DOB [birth year]) female.  Chief Complaint:  Chief Complaint  Patient presents with   Depression   Anxiety    HPI CARABELLA WADDELL presents to the office today for follow-up of anxiety and depression. She reports that it is difficult to evaluate response to Buspar since she also resumed antidepressants. She reports some recent stressors. She reports worry about son and changes in their relationship. She reports frequent GI symptoms and recalls having GI issues when she was anxious before a flight. She reports that she is easily tearful. She reports difficulty making decisions and will research something excessively. Some difficulty with concentration "because I get overwhelmed." Denies diminished interest in things. She reports some recent depression. She reports that there are few things that she looks forward to. Low energy and motivation. She reports not feeling rested even after sleeping an adequate amount. She denies difficulty falling asleep. Appetite has been decreased with Metformin. Denies SI.   Reports praying and meditating regularly. Goes to yoga and pilates regularly. Recently attended a yoga retreat in [place].   She reports supportive friends.   Reports that she has not taken Xanax prn recently.   Past Psychiatric Medication Trials: Prozac: Took for 20 years.  Lexapro- Seemed to be ok.  Wellbutrin XL- No improvement or side effects.  Xanax   PHQ2-9    Flowsheet Row Office Visit from 01/07/2023 in [facility] at [facility] Clinical Support from 10/10/2022 in [facility] Primary Care at [facility] Office Visit from 07/09/2022 in [facility] at [facility] Office Visit from 04/11/2021 in [facility] at [facility] Clinical Support from 03/20/2021 in [facility] at [facility] Total Score 0 0 0 0 0  PHQ-9 Total Score 0 -- 0 0 --      Flowsheet Row Admission (Discharged) from 04/25/2021 in [facility] Pre-Admission Testing 60 from 04/12/2021 in [facility]  C-SSRS RISK CATEGORY No Risk No Risk        Review of Systems:  Review of Systems  Gastrointestinal:  Positive for diarrhea.  Musculoskeletal:  Positive for arthralgias and myalgias. Negative for gait problem.  Neurological:  Negative for dizziness and light-headedness.  Psychiatric/Behavioral:         Please refer to HPI    Medications: I have reviewed the patient's current medications.  Current Outpatient Medications  Medication Sig Dispense Refill   b complex vitamins tablet Take 1 tablet by mouth daily.     Bacillus Coagulans-Inulin (PROBIOTIC-PREBIOTIC PO) Take by mouth.     cholecalciferol (VITAMIN D) 1000 units tablet Take 1,000 Units by mouth daily.     COLLAGEN PO Take 1 Scoop by mouth daily.     DULoxetine (CYMBALTA) 30 MG capsule Take 1 capsule po q am x 1 week, then 2 capsules po q am 30 capsule 0   DULoxetine (CYMBALTA) 60 MG capsule Take 1 capsule (60 mg total) by mouth daily. 90 capsule 0   eletriptan (RELPAX) 20 MG tablet Take 1 tablet (20 mg total) by mouth as needed for migraine or headache. May repeat in 2 hours if headache persists or recurs. 10 tablet 5   esomeprazole (NEXIUM) 20 MG capsule Take 20 mg by mouth daily.     ezetimibe (ZETIA) 10 MG tablet  TAKE 1 TABLET BY MOUTH DAILY 90 tablet 2   metFORMIN (GLUCOPHAGE-XR) 500 MG 24 hr tablet TAKE 1 TABLET BY MOUTH TWICE  DAILY WITH A MEAL 180 tablet 3   naproxen sodium (ALEVE) 220 MG tablet Take 220 mg by mouth daily as needed.     rosuvastatin (CRESTOR) 20 MG tablet TAKE 1 TABLET BY MOUTH DAILY 90 tablet 3   ALPRAZolam (XANAX) 0.5 MG tablet alprazolam 0.5 mg tablet  TAKE 1 TABLET BY MOUTH EVERY DAY AS NEEDED (Patient not taking:  Reported on 07/11/2023)     amoxicillin (AMOXIL) 500 MG capsule amoxicillin 500 mg capsule  TAKE 4 CAPSULES BY MOUTH 1 HOUR PRIOR TO DENTAL WORK (Patient not taking: Reported on 01/07/2023)     busPIRone (BUSPAR) 15 MG tablet Take 1 tablet (15 mg total) by mouth 2 (two) times daily. 180 tablet 0   cetirizine (ZYRTEC) 10 MG tablet Take 10 mg by mouth daily as needed for allergies.     cycloSPORINE (RESTASIS) 0.05 % ophthalmic emulsion  (Patient not taking: Reported on 07/11/2023)     EPINEPHrine (EPIPEN 2-PAK) 0.3 mg/0.3 mL IJ SOAJ injection INJECT 0.3 MLS (0.3 MG TOTAL) INTO THE MUSCLE ONCE. (Patient not taking: Reported on 01/07/2023) 2 each 1   escitalopram (LEXAPRO) 20 MG tablet Decrease Lexapro to 10 mg daily for one week, then stop     hydrocortisone (ANUSOL-HC) 2.5 % rectal cream Place 1 application rectally 2 (two) times daily as needed for hemorrhoids or anal itching.     No current facility-administered medications for this visit.    Medication Side Effects: None  Allergies:  Allergies  Allergen Reactions   Ace Inhibitors Cough    Past Medical History:  Diagnosis Date   Anaphylaxis 02/14/2011   Unknown cause.  Always carries Epipen.   Angioedema    Anxiety    Arthritis    Cough    Depression    Dr. Raquel James   Exposure to hepatitis C    GERD (gastroesophageal reflux disease)    Hyperlipidemia    Hypertension    IBS (irritable bowel syndrome)    Internal and external bleeding hemorrhoids    Migraine    PONV (postoperative nausea and vomiting)    Rosacea     Past Medical History, Surgical history, Social history, and Family history were reviewed and updated as appropriate.   Please see review of systems for further details on the patient's review from today.   Objective:   Physical Exam:  There were no vitals taken for this visit.  Physical Exam Constitutional:      General: She is not in acute distress. Musculoskeletal:        General: No deformity.   Neurological:     Mental Status: She is alert and oriented to person, place, and time.     Coordination: Coordination normal.  Psychiatric:        Attention and Perception: Attention and perception normal. She does not perceive auditory or visual hallucinations.        Mood and Affect: Mood is anxious and depressed. Affect is tearful. Affect is not labile, blunt, angry or inappropriate.        Speech: Speech normal.        Behavior: Behavior normal.        Thought Content: Thought content normal. Thought content is not paranoid or delusional. Thought content does not include homicidal or suicidal ideation. Thought content does not include homicidal or suicidal plan.  Cognition and Memory: Cognition and memory normal.        Judgment: Judgment normal.     Comments: Insight intact     Lab Review:     Component Value Date/Time   NA 140 01/07/2023 0944   NA 142 11/27/2018 1433   K 4.7 01/07/2023 0944   CL 102 01/07/2023 0944   CO2 29 01/07/2023 0944   GLUCOSE 116 (H) 01/07/2023 0944   GLUCOSE 87 09/06/2010 0000   BUN 22 01/07/2023 0944   BUN 14 11/27/2018 1433   CREATININE 0.97 01/07/2023 0944   CREATININE 0.92 02/20/2017 1554   CALCIUM 10.0 01/07/2023 0944   PROT 7.0 07/09/2022 1057   PROT 6.9 11/27/2018 1433   ALBUMIN 4.3 07/09/2022 1057   ALBUMIN 4.2 11/27/2018 1433   AST 23 01/07/2023 0944   ALT 25 01/07/2023 0944   ALKPHOS 63 07/09/2022 1057   BILITOT 0.4 07/09/2022 1057   BILITOT <0.2 11/27/2018 1433   GFRNONAA >60 04/26/2021 0256   GFRAA 55 (L) 11/27/2018 1433       Component Value Date/Time   WBC 4.6 07/09/2022 1057   RBC 4.77 07/09/2022 1057   HGB 13.8 07/09/2022 1057   HGB 11.7 11/27/2018 1433   HGB 12.8 01/05/2015 0922   HCT 41.3 07/09/2022 1057   HCT 37.2 11/27/2018 1433   PLT 208.0 07/09/2022 1057   PLT 272 11/27/2018 1433   MCV 86.7 07/09/2022 1057   MCV 76 (L) 11/27/2018 1433   MCH 29.3 04/26/2021 0256   MCHC 33.3 07/09/2022 1057   RDW 14.3  07/09/2022 1057   RDW 20.5 (H) 11/27/2018 1433   LYMPHSABS 1.5 07/09/2022 1057   LYMPHSABS 2.3 11/27/2018 1433   MONOABS 0.5 07/09/2022 1057   EOSABS 0.1 07/09/2022 1057   EOSABS 0.1 11/27/2018 1433   BASOSABS 0.1 07/09/2022 1057   BASOSABS 0.0 11/27/2018 1433    No results found for: "POCLITH", "LITHIUM"   No results found for: "PHENYTOIN", "PHENOBARB", "VALPROATE", "CBMZ"   .res Assessment: Plan:    34 minutes spent dedicated to the care of this patient on the date of this encounter to include pre-visit review of records, ordering of medication, post visit documentation, and face-to-face time with the patient discussing response to Buspar and possible treatment options.Discussed potential benefits, risks, and side effects of Cymbalta for anxiety and depression.  Patient agrees to trial of Cymbalta.  Will start Cymbalta 30 mg p.o. every morning for 1 week, then increase to 60 mg daily. Discussed cross-titration of medications to minimize risk of discontinuation and possible side effects. Will decrease Lexapro to 10 mg daily for one week, then stop.  Continue Buspar 15 mg po BID for anxiety.  Will renew Alprazolam prn since pt reports that this has been helpful for situational anxiety.  Pt to follow-up with this provider in 4-6 weeks or sooner if clinically indicated.  Discussed considering resuming therapy to improve mood and anxiety symptoms.  Patient advised to contact office with any questions, adverse effects, or acute worsening in signs and symptoms.   Depression, unspecified depression type -     DULoxetine (CYMBALTA) 30 MG capsule; Take 1 capsule po q am x 1 week, then 2 capsules po q am -     DULoxetine (CYMBALTA) 60 MG capsule; Take 1 capsule (60 mg total) by mouth daily. -     escitalopram (LEXAPRO) 20 MG tablet; Decrease Lexapro to 10 mg daily for one week, then stop  Generalized anxiety disorder -  DULoxetine (CYMBALTA) 30 MG capsule; Take 1 capsule po q am x 1 week,  then 2 capsules po q am -     DULoxetine (CYMBALTA) 60 MG capsule; Take 1 capsule (60 mg total) by mouth daily. -     escitalopram (LEXAPRO) 20 MG tablet; Decrease Lexapro to 10 mg daily for one week, then stop -     busPIRone (BUSPAR) 15 MG tablet; Take 1 tablet (15 mg total) by mouth 2 (two) times daily.      Please see After Visit Summary for patient specific instructions.  Future Appointments  Date Time Provider Department Center  10/22/2023 11:00 AM Corie Chiquito, PMHNP [facility] None     No orders of the defined types were placed in this encounter.   -------------------------------

## 2023-09-14 ENCOUNTER — Other Ambulatory Visit: Payer: Self-pay | Admitting: Psychiatry

## 2023-09-14 DIAGNOSIS — F32A Depression, unspecified: Secondary | ICD-10-CM

## 2023-09-14 DIAGNOSIS — F411 Generalized anxiety disorder: Secondary | ICD-10-CM

## 2023-10-11 ENCOUNTER — Telehealth: Payer: Self-pay

## 2023-10-11 ENCOUNTER — Other Ambulatory Visit (HOSPITAL_BASED_OUTPATIENT_CLINIC_OR_DEPARTMENT_OTHER): Payer: Self-pay | Admitting: Internal Medicine

## 2023-10-11 DIAGNOSIS — Z139 Encounter for screening, unspecified: Secondary | ICD-10-CM

## 2023-10-11 NOTE — Telephone Encounter (Signed)
Copied from CRM [id number]. Topic: Clinical - Request for Lab/Test Order >> Oct 11, 2023  1:25 PM Hector Shade B wrote: Reason for CRM: Patient was also concerned about  having a bone density done but she stated she wasn't sure if it was with Dr. Drue Novel or her Gyn. Patient stated she would speak with provider when she arrives for her appt.

## 2023-10-14 ENCOUNTER — Ambulatory Visit: Payer: Medicare Other

## 2023-10-14 NOTE — Telephone Encounter (Signed)
Last bone density was done on 10/30/22, will not be eligible for another until 2026.

## 2023-10-22 ENCOUNTER — Ambulatory Visit: Payer: Medicare Other | Admitting: Psychiatry

## 2023-10-23 ENCOUNTER — Other Ambulatory Visit: Payer: Self-pay | Admitting: Psychiatry

## 2023-10-23 DIAGNOSIS — F32A Depression, unspecified: Secondary | ICD-10-CM

## 2023-10-23 DIAGNOSIS — F411 Generalized anxiety disorder: Secondary | ICD-10-CM

## 2023-10-25 ENCOUNTER — Ambulatory Visit (INDEPENDENT_AMBULATORY_CARE_PROVIDER_SITE_OTHER): Payer: Medicare Other | Admitting: Internal Medicine

## 2023-10-25 ENCOUNTER — Encounter: Payer: Self-pay | Admitting: Internal Medicine

## 2023-10-25 VITALS — BP 132/80 | HR 80 | Temp 98.0°F | Resp 16 | Ht 60.0 in | Wt 160.1 lb

## 2023-10-25 DIAGNOSIS — R7989 Other specified abnormal findings of blood chemistry: Secondary | ICD-10-CM

## 2023-10-25 DIAGNOSIS — E785 Hyperlipidemia, unspecified: Secondary | ICD-10-CM | POA: Diagnosis not present

## 2023-10-25 DIAGNOSIS — E559 Vitamin D deficiency, unspecified: Secondary | ICD-10-CM

## 2023-10-25 DIAGNOSIS — Z0001 Encounter for general adult medical examination with abnormal findings: Secondary | ICD-10-CM

## 2023-10-25 DIAGNOSIS — R739 Hyperglycemia, unspecified: Secondary | ICD-10-CM | POA: Diagnosis not present

## 2023-10-25 DIAGNOSIS — Z0000 Encounter for general adult medical examination without abnormal findings: Secondary | ICD-10-CM

## 2023-10-25 NOTE — Patient Instructions (Addendum)
 Vaccines I recommend: RSV   GO TO THE LAB : Get the blood work     Next visit with me in 1 year.  Sooner if needed.  Please schedule it at the front desk

## 2023-10-25 NOTE — Progress Notes (Signed)
 Subjective:    Patient ID: Cassandra Little, female    DOB: 10/02/1946, 78 y.o.   MRN: [id number]  DOS:  10/25/2023 Type of visit - description: cpx  Here for CPX. Overall feels well. Has diarrhea on and off for a long time, started probiotics about 2 to 3 months ago and that seems to be helping. Denies fever or chills.  No blood in the stools.  No alternating constipation.   Wt Readings from Last 3 Encounters:  10/25/23 160 lb 2 oz (72.6 kg)  05/08/23 164 lb 6.4 oz (74.6 kg)  01/07/23 153 lb 4 oz (69.5 kg)    Review of Systems  Other than above, a 14 point review of systems is negative    Past Medical History:  Diagnosis Date   Anaphylaxis 02/14/2011   Unknown cause.  Always carries Epipen .   Angioedema    Anxiety    Arthritis    Cough    Depression    Dr. Alroy   Exposure to hepatitis C    GERD (gastroesophageal reflux disease)    Hyperlipidemia    Hypertension    IBS (irritable bowel syndrome)    Internal and external bleeding hemorrhoids    Migraine    PONV (postoperative nausea and vomiting)    Rosacea     Past Surgical History:  Procedure Laterality Date   ABDOMINOPLASTY  2000   APPENDECTOMY  2004   AUGMENTATION MAMMAPLASTY Bilateral 2011   CESAREAN SECTION  1982, 1985   x 2   COLONOSCOPY  09/15/2008   internal and external hemorroids   COSMETIC SURGERY     multiple   HEMORRHOID BANDING  2014   REDUCTION MAMMAPLASTY Bilateral 2002   TONSILLECTOMY  1962   TOTAL KNEE ARTHROPLASTY Right 12/26/2015   Procedure: TOTAL KNEE ARTHROPLASTY;  Surgeon: Donnice Car, MD;  Location: [facility];  Service: Orthopedics;  Laterality: Right;   TOTAL KNEE ARTHROPLASTY Left 04/25/2021   Procedure: TOTAL KNEE ARTHROPLASTY;  Surgeon: Car Donnice, MD;  Location: [facility];  Service: Orthopedics;  Laterality: Left;  70 mins   TOTAL SHOULDER REPLACEMENT Bilateral 2009   right   TOTAL SHOULDER REPLACEMENT     UPPER GASTROINTESTINAL ENDOSCOPY  11/26/2018   Social History    Socioeconomic History   Marital status: Divorced    Spouse name: Not on file   Number of children: 2   Years of education: Not on file   Highest education level: Master's degree (e.g., MA, MS, MEng, MEd, MSW, MBA)  Occupational History   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[employer]    Employer: [employer]    Comment: Retired  Tobacco Use   Smoking status: Never   Smokeless tobacco: Never  Vaping Use   Vaping status: Never Used  Substance and Sexual Activity   Alcohol  use: Yes    Comment: Infrequently   Drug use: No   Sexual activity: Not Currently  Other Topics Concern   Not on file  Social History Narrative   She is divorced and retired from [employer]   1 cup of coffee and frequent tea consumption   Daughter lives in area [place] teacher   Son professor [employer]   1 drink a week no drugs no tobacco   Social Drivers of Corporate Investment Banker Strain: Low Risk  (10/12/2023)   Overall Financial Resource Strain (CARDIA)    Difficulty of Paying Living Expenses: Not hard at all  Food Insecurity: No Food Insecurity (10/12/2023)   Hunger Vital Sign  Worried About Programme Researcher, Broadcasting/film/video in the Last Year: Never true    Ran Out of Food in the Last Year: Never true  Transportation Needs: No Transportation Needs (10/12/2023)   PRAPARE - Administrator, Civil Service (Medical): No    Lack of Transportation (Non-Medical): No  Physical Activity: Sufficiently Active (10/12/2023)   Exercise Vital Sign    Days of Exercise per Week: 4 days    Minutes of Exercise per Session: 40 min  Stress: Stress Concern Present (10/12/2023)   [employer] of Occupational Health - Occupational Stress Questionnaire    Feeling of Stress : Rather much  Social Connections: Unknown (10/12/2023)   Social Connection and Isolation Panel [NHANES]    Frequency of Communication with Friends and Family: More than three times a week    Frequency of Social Gatherings with Friends and Family: Three times a week    Attends  Religious Services: Patient declined    Active Member of Clubs or Organizations: Yes    Attends Banker Meetings: More than 4 times per year    Marital Status: Divorced  Intimate Partner Violence: Not At Risk (10/10/2022)   Humiliation, Afraid, Rape, and Kick questionnaire    Fear of Current or Ex-Partner: No    Emotionally Abused: No    Physically Abused: No    Sexually Abused: No     Current Outpatient Medications  Medication Instructions   ALPRAZolam  (XANAX ) 0.5 mg, Oral, Daily PRN   amoxicillin (AMOXIL) 500 MG capsule amoxicillin 500 mg capsule  TAKE 4 CAPSULES BY MOUTH 1 HOUR PRIOR TO DENTAL WORK   b complex vitamins tablet 1 tablet, Daily   Bacillus Coagulans-Inulin (PROBIOTIC-PREBIOTIC PO) Take by mouth.   busPIRone  (BUSPAR ) 15 mg, Oral, 2 times daily   cetirizine (ZYRTEC) 10 mg, Daily PRN   cholecalciferol (VITAMIN D ) 1,000 Units, Daily   COLLAGEN PO 1 Scoop, Daily   DULoxetine  (CYMBALTA ) 60 mg, Oral, Daily   eletriptan  (RELPAX ) 20 mg, Oral, As needed, May repeat in 2 hours if headache persists or recurs.   EPINEPHrine  (EPIPEN  2-PAK) 0.3 mg/0.3 mL IJ SOAJ injection INJECT 0.3 MLS (0.3 MG TOTAL) INTO THE MUSCLE ONCE.   escitalopram  (LEXAPRO ) 20 MG tablet Decrease Lexapro  to 10 mg daily for one week, then stop   esomeprazole  (NEXIUM ) 20 mg, Daily   ezetimibe  (ZETIA ) 10 mg, Oral, Daily   hydrocortisone  (ANUSOL -HC) 2.5 % rectal cream 1 application , 2 times daily PRN   metFORMIN  (GLUCOPHAGE -XR) 500 mg, Oral, 2 times daily with meals   naproxen sodium (ALEVE) 220 mg, Daily PRN   rosuvastatin  (CRESTOR ) 20 mg, Oral, Daily       Objective:   Physical Exam BP 132/80   Pulse 80   Temp 98 F (36.7 C) (Oral)   Resp 16   Ht 5' (1.524 m)   Wt 160 lb 2 oz (72.6 kg)   SpO2 97%   BMI 31.27 kg/m  General: Well developed, NAD, BMI noted Neck: No  thyromegaly  HEENT:  Normocephalic . Face symmetric, atraumatic Lungs:  CTA B Normal respiratory effort, no  intercostal retractions, no accessory muscle use. Heart: RRR,  no murmur.  Abdomen:  Not distended, soft, non-tender. No rebound or rigidity.   Lower extremities: no pretibial edema bilaterally  Skin: Exposed areas without rash. Not pale. Not jaundice Neurologic:  alert & oriented X3.  Speech normal, gait appropriate for age and unassisted Strength symmetric and appropriate for age.  Psych: Cognition and  judgment appear intact.  Cooperative with normal attention span and concentration.  Behavior appropriate. No anxious or depressed appearing.     Assessment     Assessment (transferring to this office due to location 06-2022) Hyperglycemia Depression-anxiety. Sees Dr Vincente  High cholesterol.  Seen at cardiology GERD Migraines (reports visual disturbances) DJD: B shoulder replacement; B TKR  ABX pre- dental d/t DJD H/o anaphylaxis- unknown trigger has a epipen  ; has seen several specialists  Vit D def Osteopenia: T-score -1.4  10/2022,  Fatigue, dizziness: Chronic per patient  PLAN Here for CPX Td 2013; had one in 2024.  S/p shingrix PNM 23: 2014;PNM 13: 2018 ;PNM 20: 2023   Had a COVID and a  flu shot recently. Recommend: RSV  Has not seen gyn    MMG 10/30/2022, ([facility]) Cscope 2020, per letter polyps benign, no further routine colonoscopies.  Few months ago she requested a Cologuard but has not returned the sample.  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 Labs: CMP CBC A1c vitamin D  Healthcare POA: on file  Hyperglycemia: Has a healthy lifestyle, getting ready to do a trip to [place], plans to walk 9 miles daily for 9 days.  On metformin .  Checking labs High cholesterol: Saw cardiology 05/08/2023, no changes recommended. On zetia  and crestor  Osteopenia: T-score -1.4 10/2022, recommend calcium , vitamin D , physical activity.  Next in about 5 years per  guidelines. Vitamin D  deficiency: On supplements, checking labs Depression anxiety: Per psychiatry RTC 1 year

## 2023-10-26 ENCOUNTER — Encounter: Payer: Self-pay | Admitting: Internal Medicine

## 2023-10-26 LAB — COMPREHENSIVE METABOLIC PANEL
AG Ratio: 1.8 (calc) (ref 1.0–2.5)
ALT: 23 U/L (ref 6–29)
AST: 21 U/L (ref 10–35)
Albumin: 4.6 g/dL (ref 3.6–5.1)
Alkaline phosphatase (APISO): 63 U/L (ref 37–153)
BUN/Creatinine Ratio: 23 (calc) — ABNORMAL HIGH (ref 6–22)
BUN: 28 mg/dL — ABNORMAL HIGH (ref 7–25)
CO2: 29 mmol/L (ref 20–32)
Calcium: 10.6 mg/dL — ABNORMAL HIGH (ref 8.6–10.4)
Chloride: 102 mmol/L (ref 98–110)
Creat: 1.22 mg/dL — ABNORMAL HIGH (ref 0.60–1.00)
Globulin: 2.5 g/dL (ref 1.9–3.7)
Glucose, Bld: 94 mg/dL (ref 65–99)
Potassium: 4.9 mmol/L (ref 3.5–5.3)
Sodium: 143 mmol/L (ref 135–146)
Total Bilirubin: 0.5 mg/dL (ref 0.2–1.2)
Total Protein: 7.1 g/dL (ref 6.1–8.1)

## 2023-10-26 LAB — CBC WITH DIFFERENTIAL/PLATELET
Absolute Lymphocytes: 2002 {cells}/uL (ref 850–3900)
Absolute Monocytes: 722 {cells}/uL (ref 200–950)
Basophils Absolute: 52 {cells}/uL (ref 0–200)
Basophils Relative: 0.8 %
Eosinophils Absolute: 182 {cells}/uL (ref 15–500)
Eosinophils Relative: 2.8 %
HCT: 44 % (ref 35.0–45.0)
Hemoglobin: 14.1 g/dL (ref 11.7–15.5)
MCH: 28.5 pg (ref 27.0–33.0)
MCHC: 32 g/dL (ref 32.0–36.0)
MCV: 88.9 fL (ref 80.0–100.0)
MPV: 10.8 fL (ref 7.5–12.5)
Monocytes Relative: 11.1 %
Neutro Abs: 3543 {cells}/uL (ref 1500–7800)
Neutrophils Relative %: 54.5 %
Platelets: 228 10*3/uL (ref 140–400)
RBC: 4.95 10*6/uL (ref 3.80–5.10)
RDW: 13.7 % (ref 11.0–15.0)
Total Lymphocyte: 30.8 %
WBC: 6.5 10*3/uL (ref 3.8–10.8)

## 2023-10-26 LAB — HEMOGLOBIN A1C
Hgb A1c MFr Bld: 6.1 %{Hb} — ABNORMAL HIGH (ref <5.7)
Mean Plasma Glucose: 128 mg/dL
eAG (mmol/L): 7.1 mmol/L

## 2023-10-26 LAB — VITAMIN D 25 HYDROXY (VIT D DEFICIENCY, FRACTURES): Vit D, 25-Hydroxy: 80 ng/mL (ref 30–100)

## 2023-10-27 ENCOUNTER — Encounter: Payer: Self-pay | Admitting: Internal Medicine

## 2023-10-27 NOTE — Assessment & Plan Note (Signed)
 Here for CPX Td 2013; had one in 2024.  S/p shingrix PNM 23: 2014;PNM 13: 2018 ;PNM 20: 2023   Had a COVID and a  flu shot recently. Recommend: RSV  Has not seen gyn    MMG 10/30/2022, ([facility]) Cscope 2020, per letter polyps benign, no further routine colonoscopies.  Few months ago she requested a Cologuard but has not returned the sample.  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 Labs: CMP CBC A1c vitamin D  Healthcare POA: on file

## 2023-10-27 NOTE — Assessment & Plan Note (Signed)
 Here for CPX  Hyperglycemia: Has a healthy lifestyle, getting ready to do a trip to [place], plans to walk 9 miles daily for 9 days.  On metformin .  Checking labs High cholesterol: Saw cardiology 05/08/2023, no changes recommended. On zetia  and crestor  Osteopenia: T-score -1.4 10/2022, recommend calcium , vitamin D , physical activity.  Next in about 5 years per guidelines. Vitamin D  deficiency: On supplements, checking labs Depression anxiety: Per psychiatry RTC 1 year

## 2023-10-28 MED ORDER — METFORMIN HCL ER 500 MG PO TB24: 500.0000 mg | ORAL_TABLET | Freq: Two times a day (BID) | ORAL | 3 refills | Status: DC

## 2023-10-29 ENCOUNTER — Ambulatory Visit (INDEPENDENT_AMBULATORY_CARE_PROVIDER_SITE_OTHER): Payer: Medicare Other

## 2023-10-29 VITALS — Ht 60.0 in | Wt 160.0 lb

## 2023-10-29 DIAGNOSIS — Z0000 Encounter for general adult medical examination without abnormal findings: Secondary | ICD-10-CM

## 2023-10-29 NOTE — Patient Instructions (Addendum)
 Cassandra Little , Thank you for taking time to come for your Medicare Wellness Visit. I appreciate your ongoing commitment to your health goals. Please review the following plan we discussed and let me know if I can assist you in the future.   Referrals/Orders/Follow-Ups/Clinician Recommendations:   This is a list of the screening recommended for you and due dates:  Health Maintenance  Topic Date Due   COVID-19 Vaccine (7 - 2024-25 season) 11/19/2023   Medicare Annual Wellness Visit  10/28/2024   DTaP/Tdap/Td vaccine (4 - Td or Tdap) 10/15/2032   Pneumonia Vaccine  Completed   Flu Shot  Completed   DEXA scan (bone density measurement)  Completed   Hepatitis C Screening  Completed   Zoster (Shingles) Vaccine  Completed   HPV Vaccine  Aged Out    Advanced directives: (Copy Requested) Please bring a copy of your health care power of attorney and living will to the office to be added to your chart at your convenience.  Next Medicare Annual Wellness Visit scheduled for next year: Yes

## 2023-10-29 NOTE — Progress Notes (Signed)
 Subjective:   Cassandra Little is a 78 y.o. female who presents for Medicare Annual (Subsequent) preventive examination.  Visit Complete: Virtual I connected with  Cassandra Little on 10/29/23 by a video and audio enabled telemedicine application and verified that I am speaking with the correct person using two identifiers.  Patient Location: Home  Provider Location: Home Office  I discussed the limitations of evaluation and management by telemedicine. The patient expressed understanding and agreed to proceed.  Vital Signs: Because this visit was a virtual/telehealth visit, some criteria may be missing or patient reported. Any vitals not documented were not able to be obtained and vitals that have been documented are patient reported.  Patient Medicare AWV questionnaire was completed by the patient on 10/22/23; I have confirmed that all information answered by patient is correct and no changes since this date.  Cardiac Risk Factors include: advanced age (>6men, >8 women)     Objective:    Today's Vitals   10/29/23 0813  Weight: 160 lb (72.6 kg)  Height: 5' (1.524 m)   Body mass index is 31.25 kg/m.     10/29/2023    8:22 AM 10/10/2022    9:46 AM 04/25/2021   10:00 AM 04/12/2021    9:50 AM 03/20/2021    9:05 AM 02/20/2017    3:18 PM 12/26/2015   10:25 AM  Advanced Directives  Does Patient Have a Medical Advance Directive? Yes Yes Yes Yes Yes Yes Yes  Type of Estate Agent of [place];Living will Healthcare Power of [place];Living will Healthcare Power of [place];Living will Healthcare Power of [place];Living will Healthcare Power of Attorney Living will;Healthcare Power of [employer] Power of [place];Living will  Does patient want to make changes to medical advance directive?  No - Patient declined No - Patient declined    No - Patient declined  Copy of Healthcare Power of Attorney in Chart? No - copy requested Yes - validated most recent copy  scanned in chart (See row information)   No - copy requested No - copy requested Yes    Current Medications (verified) Outpatient Encounter Medications as of 10/29/2023  Medication Sig   ALPRAZolam  (XANAX ) 0.5 MG tablet Take 1 tablet (0.5 mg total) by mouth daily as needed for anxiety.   amoxicillin (AMOXIL) 500 MG capsule amoxicillin 500 mg capsule  TAKE 4 CAPSULES BY MOUTH 1 HOUR PRIOR TO DENTAL WORK (Patient not taking: Reported on 10/25/2023)   b complex vitamins tablet Take 1 tablet by mouth daily.   Bacillus Coagulans-Inulin (PROBIOTIC-PREBIOTIC PO) Take by mouth.   busPIRone  (BUSPAR ) 15 MG tablet Take 1 tablet (15 mg total) by mouth 2 (two) times daily.   cetirizine (ZYRTEC) 10 MG tablet Take 10 mg by mouth daily as needed for allergies.   cholecalciferol (VITAMIN D ) 1000 units tablet Take 1,000 Units by mouth daily.   COLLAGEN PO Take 1 Scoop by mouth daily.   DULoxetine  (CYMBALTA ) 60 MG capsule Take 1 capsule (60 mg total) by mouth daily.   eletriptan  (RELPAX ) 20 MG tablet Take 1 tablet (20 mg total) by mouth as needed for migraine or headache. May repeat in 2 hours if headache persists or recurs.   EPINEPHrine  (EPIPEN  2-PAK) 0.3 mg/0.3 mL IJ SOAJ injection INJECT 0.3 MLS (0.3 MG TOTAL) INTO THE MUSCLE ONCE. (Patient not taking: Reported on 10/25/2023)   escitalopram  (LEXAPRO ) 20 MG tablet Decrease Lexapro  to 10 mg daily for one week, then stop   esomeprazole  (NEXIUM ) 20 MG capsule  Take 20 mg by mouth daily.   ezetimibe  (ZETIA ) 10 MG tablet TAKE 1 TABLET BY MOUTH DAILY   hydrocortisone  (ANUSOL -HC) 2.5 % rectal cream Place 1 application rectally 2 (two) times daily as needed for hemorrhoids or anal itching.   metFORMIN  (GLUCOPHAGE -XR) 500 MG 24 hr tablet Take 1 tablet (500 mg total) by mouth 2 (two) times daily with a meal.   naproxen sodium (ALEVE) 220 MG tablet Take 220 mg by mouth daily as needed.   rosuvastatin  (CRESTOR ) 20 MG tablet TAKE 1 TABLET BY MOUTH DAILY   No  facility-administered encounter medications on file as of 10/29/2023.    Allergies (verified) Ace inhibitors   History: Past Medical History:  Diagnosis Date   Anaphylaxis 02/14/2011   Unknown cause.  Always carries Epipen .   Angioedema    Anxiety    Arthritis    Cough    Depression    Dr. Alroy   Exposure to hepatitis C    GERD (gastroesophageal reflux disease)    Hyperlipidemia    Hypertension    IBS (irritable bowel syndrome)    Internal and external bleeding hemorrhoids    Migraine    PONV (postoperative nausea and vomiting)    Rosacea    Past Surgical History:  Procedure Laterality Date   ABDOMINOPLASTY  2000   APPENDECTOMY  2004   AUGMENTATION MAMMAPLASTY Bilateral 2011   CESAREAN SECTION  1982, 1985   x 2   COLONOSCOPY  09/15/2008   internal and external hemorroids   COSMETIC SURGERY     multiple   HEMORRHOID BANDING  2014   REDUCTION MAMMAPLASTY Bilateral 2002   TONSILLECTOMY  1962   TOTAL KNEE ARTHROPLASTY Right 12/26/2015   Procedure: TOTAL KNEE ARTHROPLASTY;  Surgeon: Donnice Car, MD;  Location: [facility];  Service: Orthopedics;  Laterality: Right;   TOTAL KNEE ARTHROPLASTY Left 04/25/2021   Procedure: TOTAL KNEE ARTHROPLASTY;  Surgeon: Car Donnice, MD;  Location: [facility];  Service: Orthopedics;  Laterality: Left;  70 mins   TOTAL SHOULDER REPLACEMENT Bilateral 2009   right   TOTAL SHOULDER REPLACEMENT     UPPER GASTROINTESTINAL ENDOSCOPY  11/26/2018   Family History  Problem Relation Age of Onset   Hyperlipidemia Mother    Heart disease Mother    Allergic rhinitis Mother    Eczema Mother    Diabetes Father    Hypertension Father    Heart attack Father        28s   Heart disease Father    Obesity Sister    Hypertension Sister    Hyperlipidemia Sister    Anxiety disorder Daughter    Depression Daughter    Depression Son    Colon cancer Neg Hx    Stomach cancer Neg Hx    Rectal cancer Neg Hx    Pancreatic cancer Neg Hx    Esophageal cancer  Neg Hx    Angioedema Neg Hx    Asthma Neg Hx    Atopy Neg Hx    Immunodeficiency Neg Hx    Urticaria Neg Hx    Breast cancer Neg Hx    Social History   Socioeconomic History   Marital status: Divorced    Spouse name: Not on file   Number of children: 2   Years of education: Not on file   Highest education level: Master's degree (e.g., MA, MS, MEng, MEd, MSW, MBA)  Occupational History   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[employer]    Employer: [employer]    Comment: Retired  Tobacco Use   Smoking status: Never   Smokeless tobacco: Never  Vaping Use   Vaping status: Never Used  Substance and Sexual Activity   Alcohol  use: Yes    Comment: Infrequently   Drug use: No   Sexual activity: Not Currently  Other Topics Concern   Not on file  Social History Narrative   She is divorced and retired from [employer]   1 cup of coffee and frequent tea consumption   Daughter lives in area [place] teacher   Son professor [employer]   1 drink a week no drugs no tobacco   Social Drivers of Corporate Investment Banker Strain: Low Risk  (10/29/2023)   Overall Financial Resource Strain (CARDIA)    Difficulty of Paying Living Expenses: Not hard at all  Food Insecurity: No Food Insecurity (10/29/2023)   Hunger Vital Sign    Worried About Running Out of Food in the Last Year: Never true    Ran Out of Food in the Last Year: Never true  Transportation Needs: No Transportation Needs (10/29/2023)   PRAPARE - Administrator, Civil Service (Medical): No    Lack of Transportation (Non-Medical): No  Physical Activity: Sufficiently Active (10/29/2023)   Exercise Vital Sign    Days of Exercise per Week: 5 days    Minutes of Exercise per Session: 60 min  Stress: Stress Concern Present (10/29/2023)   [employer] of Occupational Health - Occupational Stress Questionnaire    Feeling of Stress : To some extent  Social Connections: Moderately Integrated (10/29/2023)   Social Connection and Isolation Panel [NHANES]     Frequency of Communication with Friends and Family: More than three times a week    Frequency of Social Gatherings with Friends and Family: More than three times a week    Attends Religious Services: More than 4 times per year    Active Member of [employer] or Organizations: Yes    Attends Engineer, Structural: More than 4 times per year    Marital Status: Divorced    Tobacco Counseling Counseling given: Not Answered   Clinical Intake:  Pre-visit preparation completed: Yes  Pain : No/denies pain     BMI - recorded: 31.25 Nutritional Status: BMI > 30  Obese Nutritional Risks: None Diabetes: No  How often do you need to have someone help you when you read instructions, pamphlets, or other written materials from your doctor or pharmacy?: 1 - Never  Interpreter Needed?: No  Information entered by :: Rojelio Blush LPN   Activities of Daily Living    10/29/2023    8:21 AM 10/22/2023    2:58 PM  In your present state of health, do you have any difficulty performing the following activities:  Hearing? 0 0  Vision? 0 0  Difficulty concentrating or making decisions? 0 0  Walking or climbing stairs? 0 0  Dressing or bathing? 0 0  Doing errands, shopping? 0 0  Preparing Food and eating ? N N  Using the Toilet? N N  In the past six months, have you accidently leaked urine? N N  Do you have problems with loss of bowel control? N N  Managing your Medications? N N  Managing your Finances? N N  Housekeeping or managing your Housekeeping? N N    Patient Care Team: Amon Aloysius BRAVO, MD as PCP - General (Internal Medicine) Ernie Cough, MD as Consulting Physician (Orthopedic Surgery) Cleotilde Ronal RAMAN, MD as Consulting Physician (Gynecology) Vincente Grip,  MD as Consulting Physician (Psychiatry)  Indicate any recent Medical Services you may have received from other than [facility] providers in the past year (date may be approximate).     Assessment:   This is a routine wellness  examination for [place].  Hearing/Vision screen Hearing Screening - Comments:: Denies hearing difficulties   Vision Screening - Comments:: Wears rx glasses - up to date with routine eye exams with  [facility]   Goals Addressed               This Visit's Progress     Stay Active (pt-stated)        Lose weight. Stay independent.       Depression Screen    10/29/2023    8:19 AM 10/25/2023   11:31 AM 01/07/2023    9:24 AM 10/10/2022    9:48 AM 07/09/2022   10:20 AM 04/11/2021    8:48 AM 03/20/2021    9:20 AM  PHQ 2/9 Scores  PHQ - 2 Score 1 2 0 0 0 0 0  PHQ- 9 Score 1 6 0  0 0     Fall Risk    10/29/2023    8:21 AM 10/25/2023   10:56 AM 10/22/2023    2:58 PM 01/07/2023    9:23 AM 10/10/2022    9:46 AM  Fall Risk   Falls in the past year? 0 0 0 0 0  Number falls in past yr: 0 0  0 0  Injury with Fall? 0 0  0 0  Risk for fall due to : No Fall Risks    No Fall Risks  Follow up Falls prevention discussed Falls evaluation completed;Education provided   Falls evaluation completed    MEDICARE RISK AT HOME: Medicare Risk at Home Any stairs in or around the home?: (Patient-Rptd) Yes If so, are there any without handrails?: (Patient-Rptd) Yes Home free of loose throw rugs in walkways, pet beds, electrical cords, etc?: (Patient-Rptd) Yes Adequate lighting in your home to reduce risk of falls?: (Patient-Rptd) Yes Life alert?: (Patient-Rptd) No Use of a cane, walker or w/c?: (Patient-Rptd) No Grab bars in the bathroom?: (Patient-Rptd) No Shower chair or bench in shower?: (Patient-Rptd) Yes Elevated toilet seat or a handicapped toilet?: (Patient-Rptd) Yes  TIMED UP AND GO:  Was the test performed?  No    Cognitive Function:        10/29/2023    8:22 AM 10/10/2022    9:59 AM  6CIT Screen  What Year? 0 points 0 points  What month? 0 points 0 points  What time? 0 points 0 points  Count back from 20 0 points 0 points  Months in reverse 0 points 0 points  Repeat phrase 0  points 4 points  Total Score 0 points 4 points    Immunizations Immunization History  Administered Date(s) Administered   Fluad Quad(high Dose 65+) 07/09/2022   Hepatitis B 08/10/2005, 09/19/2005, 05/28/2006   Influenza Inj Mdck Quad Pf 08/21/2017   Influenza, High Dose Seasonal PF 09/24/2018   Influenza-Unspecified 09/15/2023   Moderna Covid-19 Vaccine Bivalent Booster 70yrs & up 08/01/2021   Moderna Sars-Covid-2 Vaccination 11/17/2019, 12/17/2019, 08/09/2020, 02/23/2021   PNEUMOCOCCAL CONJUGATE-20 10/09/2022   Pfizer(Comirnaty)Fall Seasonal Vaccine 12 years and older 09/24/2023   Pneumococcal Conjugate-13 02/20/2017   Pneumococcal Polysaccharide-23 05/27/2013   Td 03/12/2002, 10/15/2022   Tdap 03/12/2012   Zoster Recombinant(Shingrix) 02/20/2017, 05/15/2017   Zoster, Live 05/02/2009    TDAP status: Up to date  Flu  Vaccine status: Up to date  Pneumococcal vaccine status: Up to date  Covid-19 vaccine status: Completed vaccines  Qualifies for Shingles Vaccine? Yes   Zostavax completed Yes   Shingrix Completed?: Yes  Screening Tests Health Maintenance  Topic Date Due   COVID-19 Vaccine (7 - 2024-25 season) 11/19/2023   Medicare Annual Wellness (AWV)  10/28/2024   DTaP/Tdap/Td (4 - Td or Tdap) 10/15/2032   Pneumonia Vaccine 94+ Years old  Completed   INFLUENZA VACCINE  Completed   DEXA SCAN  Completed   Hepatitis C Screening  Completed   Zoster Vaccines- Shingrix  Completed   HPV VACCINES  Aged Out    Health Maintenance  There are no preventive care reminders to display for this patient.       Bone Density status: Completed 10/30/22. Results reflect: Bone density results: OSTEOPENIA. Repeat every   years.   Additional Screening:  Hepatitis C Screening: does qualify; Completed 02/27/16  Vision Screening: Recommended annual ophthalmology exams for early detection of glaucoma and other disorders of the eye. Is the patient up to date with their annual eye  exam?  Yes  Who is the provider or what is the name of the office in which the patient attends annual eye exams? [facility] If pt is not established with a provider, would they like to be referred to a provider to establish care? No .   Dental Screening: Recommended annual dental exams for proper oral hygiene    Community Resource Referral / Chronic Care Management:  CRR required this visit?  No   CCM required this visit?  No     Plan:     I have personally reviewed and noted the following in the patient's chart:   Medical and social history Use of alcohol , tobacco or illicit drugs  Current medications and supplements including opioid prescriptions. Patient is not currently taking opioid prescriptions. Functional ability and status Nutritional status Physical activity Advanced directives List of other physicians Hospitalizations, surgeries, and ER visits in previous 12 months Vitals Screenings to include cognitive, depression, and falls Referrals and appointments  In addition, I have reviewed and discussed with patient certain preventive protocols, quality metrics, and best practice recommendations. A written personalized care plan for preventive services as well as general preventive health recommendations were provided to patient.     Rojelio LELON Blush, LPN   [id number]   After Visit Summary: (MyChart) Due to this being a telephonic visit, the after visit summary with patients personalized plan was offered to patient via MyChart   Nurse Notes: None

## 2023-11-04 ENCOUNTER — Ambulatory Visit (HOSPITAL_BASED_OUTPATIENT_CLINIC_OR_DEPARTMENT_OTHER)
Admission: RE | Admit: 2023-11-04 | Discharge: 2023-11-04 | Disposition: A | Discharge status: Home / Self Care | Payer: Medicare Other | Source: Ambulatory Visit | Attending: Internal Medicine | Admitting: Internal Medicine

## 2023-11-04 ENCOUNTER — Encounter (HOSPITAL_BASED_OUTPATIENT_CLINIC_OR_DEPARTMENT_OTHER): Payer: Self-pay

## 2023-11-04 ENCOUNTER — Encounter: Payer: Self-pay | Admitting: Internal Medicine

## 2023-11-04 DIAGNOSIS — Z139 Encounter for screening, unspecified: Secondary | ICD-10-CM | POA: Diagnosis present

## 2023-11-04 DIAGNOSIS — Z1231 Encounter for screening mammogram for malignant neoplasm of breast: Secondary | ICD-10-CM | POA: Diagnosis not present

## 2023-11-04 NOTE — Addendum Note (Signed)
Addended by: Conrad Newaygo D on: 11/04/2023 10:56 AM   Modules accepted: Orders

## 2023-11-13 ENCOUNTER — Other Ambulatory Visit: Payer: Self-pay

## 2023-11-13 DIAGNOSIS — F411 Generalized anxiety disorder: Secondary | ICD-10-CM

## 2023-11-13 DIAGNOSIS — F32A Depression, unspecified: Secondary | ICD-10-CM

## 2023-11-15 ENCOUNTER — Encounter: Payer: Self-pay | Admitting: Internal Medicine

## 2023-11-15 ENCOUNTER — Other Ambulatory Visit (INDEPENDENT_AMBULATORY_CARE_PROVIDER_SITE_OTHER): Payer: Medicare Other

## 2023-11-15 DIAGNOSIS — R7989 Other specified abnormal findings of blood chemistry: Secondary | ICD-10-CM

## 2023-11-15 LAB — BASIC METABOLIC PANEL
BUN: 26 mg/dL — ABNORMAL HIGH (ref 6–23)
CO2: 27 meq/L (ref 19–32)
Calcium: 9.4 mg/dL (ref 8.4–10.5)
Chloride: 104 meq/L (ref 96–112)
Creatinine, Ser: 0.81 mg/dL (ref 0.40–1.20)
GFR: 70.99 mL/min (ref >60.00)
Glucose, Bld: 95 mg/dL (ref 70–99)
Potassium: 4.6 meq/L (ref 3.5–5.1)
Sodium: 139 meq/L (ref 135–145)

## 2023-11-19 ENCOUNTER — Ambulatory Visit (INDEPENDENT_AMBULATORY_CARE_PROVIDER_SITE_OTHER): Payer: Medicare Other | Admitting: Physician Assistant

## 2023-11-19 ENCOUNTER — Encounter: Payer: Self-pay | Admitting: Physician Assistant

## 2023-11-19 DIAGNOSIS — F411 Generalized anxiety disorder: Secondary | ICD-10-CM

## 2023-11-19 DIAGNOSIS — F4323 Adjustment disorder with mixed anxiety and depressed mood: Secondary | ICD-10-CM

## 2023-11-19 MED ORDER — DULOXETINE HCL 30 MG PO CPEP: 30.0000 mg | ORAL_CAPSULE | Freq: Every day | ORAL | 0 refills | Status: DC

## 2023-11-19 MED ORDER — BUSPIRONE HCL 15 MG PO TABS: 15.0000 mg | ORAL_TABLET | Freq: Three times a day (TID) | ORAL | 0 refills | Status: DC

## 2023-11-19 MED ORDER — ALPRAZOLAM 0.5 MG PO TABS: 0.5000 mg | ORAL_TABLET | Freq: Two times a day (BID) | ORAL | 1 refills | Status: DC | PRN

## 2023-11-19 MED ORDER — FLUOXETINE HCL 20 MG PO CAPS: 20.0000 mg | ORAL_CAPSULE | Freq: Every day | ORAL | 1 refills | Status: DC

## 2023-11-19 NOTE — Progress Notes (Signed)
 [facility]  Patient ID: Cassandra Little,  MRN: [id number]  PCP: Amon Aloysius BRAVO, MD  Date of Evaluation: 11/19/2023 Time spent:25 minutes  Chief Complaint:  Chief Complaint   Anxiety; Depression; Follow-up    HISTORY/CURRENT STATUS: HPI Transferring to my care from Harlene Pepper, NP who is no longer with our practice.   Cries easily. Always has. Her dog's back legs aren't working right now, her adult kids are giving her probs, she's concerned about being older.  States she feels down a lot of the time.  Has low energy and motivation but can rise to the occasion.  She enjoys traveling and is going to [place] soon for a Pilgrimage and is looking forward to that.  ADLs and personal hygiene are normal.  Sleeps well most of the time.  Denies any changes in concentration, making decisions, or remembering things.  Appetite has not changed.  Weight is stable.  No panic attacks.  Generalized anxiety has decreased since she has been on BuSpar .  Denies suicidal or homicidal thoughts.  Patient denies increased energy with decreased need for sleep, increased talkativeness, racing thoughts, impulsivity or risky behaviors, increased spending, increased libido, grandiosity, increased irritability or anger, paranoia, or hallucinations.  Denies dizziness, syncope, seizures, numbness, tingling, tremor, tics, unsteady gait, slurred speech, confusion. Denies muscle or joint pain, stiffness, or dystonia.  Individual Medical History/ Review of Systems: Changes? :No   Past Psychiatric Medication Trials: Prozac : Took for 20 years.  Lexapro - Seemed to be ok.  Wellbutrin  XL- No improvement or side effects.  Cymbalta - Xanax   Allergies: Ace inhibitors  Current Medications:  Current Outpatient Medications:    amoxicillin (AMOXIL) 500 MG capsule, , Disp: , Rfl:    b complex vitamins tablet, Take 1 tablet by mouth daily., Disp: , Rfl:    Bacillus Coagulans-Inulin (PROBIOTIC-PREBIOTIC PO), Take by  mouth., Disp: , Rfl:    cholecalciferol (VITAMIN D ) 1000 units tablet, Take 1,000 Units by mouth daily., Disp: , Rfl:    COLLAGEN PO, Take 1 Scoop by mouth daily., Disp: , Rfl:    DULoxetine  (CYMBALTA ) 30 MG capsule, Take 1 capsule (30 mg total) by mouth daily., Disp: 7 capsule, Rfl: 0   eletriptan  (RELPAX ) 20 MG tablet, Take 1 tablet (20 mg total) by mouth as needed for migraine or headache. May repeat in 2 hours if headache persists or recurs., Disp: 10 tablet, Rfl: 5   esomeprazole  (NEXIUM ) 20 MG capsule, Take 20 mg by mouth daily., Disp: , Rfl:    ezetimibe  (ZETIA ) 10 MG tablet, TAKE 1 TABLET BY MOUTH DAILY, Disp: 90 tablet, Rfl: 2   FLUoxetine  (PROZAC ) 20 MG capsule, Take 1 capsule (20 mg total) by mouth daily. After 2 weeks, increase to 2 pills., Disp: 30 capsule, Rfl: 1   hydrocortisone  (ANUSOL -HC) 2.5 % rectal cream, Place 1 application rectally 2 (two) times daily as needed for hemorrhoids or anal itching., Disp: , Rfl:    metFORMIN  (GLUCOPHAGE -XR) 500 MG 24 hr tablet, Take 1 tablet (500 mg total) by mouth 2 (two) times daily with a meal., Disp: 180 tablet, Rfl: 3   naproxen sodium (ALEVE) 220 MG tablet, Take 220 mg by mouth daily as needed., Disp: , Rfl:    rosuvastatin  (CRESTOR ) 20 MG tablet, TAKE 1 TABLET BY MOUTH DAILY, Disp: 90 tablet, Rfl: 3   ALPRAZolam  (XANAX ) 0.5 MG tablet, Take 1 tablet (0.5 mg total) by mouth 2 (two) times daily as needed for anxiety., Disp: 30 tablet, Rfl: 1   busPIRone  (  BUSPAR ) 15 MG tablet, Take 1 tablet (15 mg total) by mouth 3 (three) times daily., Disp: 270 tablet, Rfl: 0   cetirizine (ZYRTEC) 10 MG tablet, Take 10 mg by mouth daily as needed for allergies., Disp: , Rfl:    EPINEPHrine  (EPIPEN  2-PAK) 0.3 mg/0.3 mL IJ SOAJ injection, INJECT 0.3 MLS (0.3 MG TOTAL) INTO THE MUSCLE ONCE. (Patient not taking: Reported on 01/07/2023), Disp: 2 each, Rfl: 1 Medication Side Effects: none  Family Medical/ Social History: Changes? No  MENTAL HEALTH EXAM:  There  were no vitals taken for this visit.There is no height or weight on file to calculate BMI.  General Appearance: Casual and Well Groomed  Eye Contact:  Good  Speech:  Clear and Coherent and Normal Rate  Volume:  Normal  Mood:  Euthymic  Affect:  Congruent  Thought Process:  Goal Directed and Descriptions of Associations: Circumstantial  Orientation:  Full (Time, Place, and Person)  Thought Content: Logical   Suicidal Thoughts:  No  Homicidal Thoughts:  No  Memory:  WNL  Judgement:  Good  Insight:  Good  Psychomotor Activity:  Normal  Concentration:  Concentration: Good  Recall:  Good  Fund of Knowledge: Good  Language: Good  Assets:  Communication Skills Desire for Improvement Financial Resources/Insurance Housing Transportation  ADL's:  Intact  Cognition: WNL  Prognosis:  Good   DIAGNOSES:    ICD-10-CM   1. Situational mixed anxiety and depressive disorder  F43.23     2. Generalized anxiety disorder  F41.1 ALPRAZolam  (XANAX ) 0.5 MG tablet    busPIRone  (BUSPAR ) 15 MG tablet     Receiving Psychotherapy: No   RECOMMENDATIONS:  PDMP reviewed. Xanax  filled 09/06/2023.  I provided 25 minutes of face to face time during this encounter, including time spent before and after the visit in records review, medical decision making, counseling pertinent to today's visit, and charting.   We discussed different treatment options.  She does not feel like the Cymbalta  is effective.  In the past Prozac  helped.  She was on that for approximately 20 years.  We agreed to restart that and wean off Cymbalta .  Benefits, risk and side effects were discussed and she accepts.  Wean off Cymbalta  as per med list. Continue Xanax  0.5 mg, 1 p.o. twice daily as needed. Continue BuSpar  15 mg, 1 p.o. 3 times daily. Restart Prozac  20 mg, 1 p.o. daily.  After 2 weeks, increase to 2 p.o. daily. Recommend counseling. Return in 6 weeks.  Verneita Cooks, PA-C

## 2023-11-29 ENCOUNTER — Ambulatory Visit: Payer: Medicare Other | Admitting: Medical

## 2023-11-29 ENCOUNTER — Encounter: Payer: Self-pay | Admitting: Medical

## 2023-11-29 VITALS — BP 120/80 | HR 97 | Temp 98.6°F | Resp 18 | Ht 60.0 in | Wt 161.0 lb

## 2023-11-29 DIAGNOSIS — J0100 Acute maxillary sinusitis, unspecified: Secondary | ICD-10-CM

## 2023-11-29 MED ORDER — AZITHROMYCIN 250 MG PO TABS: ORAL_TABLET | ORAL | 0 refills | Status: AC

## 2023-11-29 MED ORDER — BENZONATATE 100 MG PO CAPS
100.0000 mg | ORAL_CAPSULE | Freq: Three times a day (TID) | ORAL | 0 refills | Status: DC | PRN
Start: 2023-11-29 — End: 2024-02-28

## 2023-11-29 MED ORDER — FLUTICASONE PROPIONATE 50 MCG/ACT NA SUSP: 2.0000 | Freq: Every day | NASAL | 1 refills | Status: DC

## 2023-11-29 NOTE — Patient Instructions (Signed)
Sinus infection following uri. I am prescribing  azithromycin antibiotic for the infection. To help with the nasal congestion I prescribed flonase nasal steroid. For your associated cough, I prescribed cough medicine benzonatate  Rest, hydrate, tylenol for fever.  Follow up in 7 days or as needed.

## 2023-11-29 NOTE — Progress Notes (Signed)
Subjective:    Patient ID: Cassandra Little, female    DOB: 03/14/1946, 78 y.o.   MRN: [id number]  HPI  Pt states about 10 days ago after visiting with grandaughter that was sick with sore throat.   Pt had runny nose, nasal congestion, mild sinus pressure and faint st. Pain has lingered/not improving. Then ironically when she made appointment today she feels mild better.   Pt also has upcoming cataract surgery on Tuesday next week.  Pt has mild cough.    No recorded  fever, no chills, no sweats or bodyaches.     Review of Systems  Constitutional:  Negative for chills, fatigue and fever.  HENT:  Negative for congestion and ear pain.   Respiratory:  Negative for cough, chest tightness, shortness of breath and wheezing.   Cardiovascular:  Negative for chest pain and palpitations.  Gastrointestinal:  Negative for abdominal pain and blood in stool.  Genitourinary:  Negative for dysuria, flank pain and frequency.  Musculoskeletal:  Negative for back pain and joint swelling.  Neurological:  Negative for dizziness and headaches.  Hematological:  Negative for adenopathy. Does not bruise/bleed easily.  Psychiatric/Behavioral:  Negative for behavioral problems and decreased concentration.     . Past Medical History:  Diagnosis Date   Anaphylaxis 02/14/2011   Unknown cause.  Always carries Epipen.   Angioedema    Anxiety    Arthritis    Cough    Depression    Dr. 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[employer]    Employer: [employer]    Comment: Retired  Tobacco Use   Smoking status: Never   Smokeless tobacco: Never  Vaping Use   Vaping status: Never Used  Substance and Sexual Activity   Alcohol use: Yes    Comment: Infrequently   Drug use: No   Sexual activity: Not Currently  Other Topics Concern   Not on file  Social History Narrative   She is divorced and retired from [employer]   1 cup of coffee and frequent tea consumption   Daughter lives in area [place] teacher   Son professor [employer]   1 drink a week no drugs no tobacco   Social Drivers of Corporate investment banker Strain: Low Risk  (10/29/2023)   Overall Financial Resource Strain (CARDIA)    Difficulty of Paying Living Expenses: Not hard at all  Food Insecurity: No Food Insecurity (10/29/2023)   Hunger Vital Sign    Worried About Running Out of Food in the Last Year: Never true    Ran Out of Food in the Last Year: Never true  Transportation Needs: No Transportation Needs (10/29/2023)   PRAPARE - Administrator, Civil Service (Medical): No    Lack of Transportation (Non-Medical): No  Physical Activity: Sufficiently Active (10/29/2023)   Exercise Vital Sign    Days of Exercise per  Week: 5 days    Minutes of Exercise per Session: 60 min  Stress: Stress Concern Present (10/29/2023)   [employer] of Occupational Health - Occupational Stress Questionnaire    Feeling of Stress : To some extent  Social Connections: Moderately Integrated (10/29/2023)   Social Connection and Isolation Panel [NHANES]    Frequency of Communication with Friends and Family: More than three times a week    Frequency of Social Gatherings with Friends and Family: More than three times a week    Attends Religious Services: More than 4 times per year    Active Member of Clubs or Organizations: Yes    Attends Banker Meetings: More than 4 times per year    Marital Status: Divorced  Intimate Partner  Violence: Not At Risk (10/29/2023)   Humiliation, Afraid, Rape, and Kick questionnaire    Fear of Current or Ex-Partner: No    Emotionally Abused: No    Physically Abused: No    Sexually Abused: No    Past Surgical History:  Procedure Laterality Date   ABDOMINOPLASTY  2000   APPENDECTOMY  2004   AUGMENTATION MAMMAPLASTY Bilateral 2011   CESAREAN SECTION  1982, 1985   x 2   COLONOSCOPY  09/15/2008   internal and external hemorroids   COSMETIC SURGERY     multiple   HEMORRHOID BANDING  2014   REDUCTION MAMMAPLASTY Bilateral 2002   TONSILLECTOMY  1962   TOTAL KNEE ARTHROPLASTY Right 12/26/2015   Procedure: TOTAL KNEE ARTHROPLASTY;  Surgeon: Durene Romans, MD;  Location: [facility];  Service: Orthopedics;  Laterality: Right;   TOTAL KNEE ARTHROPLASTY Left 04/25/2021   Procedure: TOTAL KNEE ARTHROPLASTY;  Surgeon: Durene Romans, MD;  Location: [facility];  Service: Orthopedics;  Laterality: Left;  70 mins   TOTAL SHOULDER REPLACEMENT Bilateral 2009   right   TOTAL SHOULDER REPLACEMENT     UPPER GASTROINTESTINAL ENDOSCOPY  11/26/2018    Family History  Problem Relation Age of Onset   Hyperlipidemia Mother    Heart disease Mother    Allergic rhinitis Mother    Eczema Mother    Diabetes Father    Hypertension Father    Heart attack Father        19s   Heart disease Father    Obesity Sister    Hypertension Sister    Hyperlipidemia Sister    Anxiety disorder Daughter    Depression Daughter    Depression Son    Colon cancer Neg Hx    Stomach cancer Neg Hx    Rectal cancer Neg Hx    Pancreatic cancer Neg Hx    Esophageal cancer Neg Hx    Angioedema Neg Hx    Asthma Neg Hx    Atopy Neg Hx    Immunodeficiency Neg Hx    Urticaria Neg Hx    Breast cancer Neg Hx     Allergies  Allergen Reactions   Ace Inhibitors Cough    Current Outpatient Medications on File Prior to Visit  Medication Sig Dispense Refill   ALPRAZolam (XANAX) 0.5 MG tablet Take 1 tablet (0.5 mg total) by mouth  2 (two) times daily as needed for anxiety. 30 tablet 1   b complex vitamins tablet Take 1 tablet by mouth daily.     Bacillus Coagulans-Inulin (PROBIOTIC-PREBIOTIC PO) Take by mouth.     busPIRone (BUSPAR) 15 MG tablet Take 1 tablet (15 mg total) by mouth 3 (three) times daily. 270 tablet 0   cetirizine (  ZYRTEC) 10 MG tablet Take 10 mg by mouth daily as needed for allergies.     cholecalciferol (VITAMIN D) 1000 units tablet Take 1,000 Units by mouth daily.     COLLAGEN PO Take 1 Scoop by mouth daily.     DULoxetine (CYMBALTA) 30 MG capsule Take 1 capsule (30 mg total) by mouth daily. 7 capsule 0   eletriptan (RELPAX) 20 MG tablet Take 1 tablet (20 mg total) by mouth as needed for migraine or headache. May repeat in 2 hours if headache persists or recurs. 10 tablet 5   EPINEPHrine (EPIPEN 2-PAK) 0.3 mg/0.3 mL IJ SOAJ injection INJECT 0.3 MLS (0.3 MG TOTAL) INTO THE MUSCLE ONCE. (Patient not taking: Reported on 01/07/2023) 2 each 1   esomeprazole (NEXIUM) 20 MG capsule Take 20 mg by mouth daily.     ezetimibe (ZETIA) 10 MG tablet TAKE 1 TABLET BY MOUTH DAILY 90 tablet 2   FLUoxetine (PROZAC) 20 MG capsule Take 1 capsule (20 mg total) by mouth daily. After 2 weeks, increase to 2 pills. 30 capsule 1   hydrocortisone (ANUSOL-HC) 2.5 % rectal cream Place 1 application rectally 2 (two) times daily as needed for hemorrhoids or anal itching.     metFORMIN (GLUCOPHAGE-XR) 500 MG 24 hr tablet Take 1 tablet (500 mg total) by mouth 2 (two) times daily with a meal. 180 tablet 3   naproxen sodium (ALEVE) 220 MG tablet Take 220 mg by mouth daily as needed.     rosuvastatin (CRESTOR) 20 MG tablet TAKE 1 TABLET BY MOUTH DAILY 90 tablet 3   No current facility-administered medications on file prior to visit.    BP (!) 148/84   Pulse 97   Temp 98.6 F (37 C)   Resp 18   Ht 5' (1.524 m)   Wt 161 lb (73 kg)   SpO2 100%   BMI 31.44 kg/m    120/80         Objective:   Physical Exam  General Mental  Status- Alert. General Appearance- Not in acute distress.   Skin General: Color- Normal Color. Moisture- Normal Moisture.  Neck No JVD.  Chest and Lung Exam Auscultation: Breath Sounds:-CTA  Cardiovascular Auscultation:Rythm- RRR Murmurs & Other Heart Sounds:Auscultation of the heart reveals- No Murmurs.  Abdomen Inspection:-Inspeection Normal. Palpation/Percussion:Note:No mass. Palpation and Percussion of the abdomen reveal- Non Tender, Non Distended + BS, no rebound or guarding.   Neurologic Cranial Nerve exam:- CN III-XII intact(No nystagmus), symmetric smile. Strength:- 5/5 equal and symmetric strength both upper and lower extremities.   Heent- maxillary sinus pressure Canals clear and normal tms.    Assessment & Plan:  Sinus infection following uri. I am prescribing  azithromycin antibiotic for the infection. To help with the nasal congestion I prescribed flonase nasal steroid. For your associated cough, I prescribed cough medicine benzonatate  Rest, hydrate, tylenol for fever.  Follow up in 7 days or as needed.   Esperanza Richters, PA-C

## 2023-12-11 ENCOUNTER — Ambulatory Visit: Payer: Medicare Other | Admitting: Psychiatry

## 2023-12-13 ENCOUNTER — Telehealth: Payer: Self-pay | Admitting: Physician Assistant

## 2023-12-13 ENCOUNTER — Other Ambulatory Visit: Payer: Self-pay | Admitting: Physician Assistant

## 2023-12-13 ENCOUNTER — Ambulatory Visit (INDEPENDENT_AMBULATORY_CARE_PROVIDER_SITE_OTHER): Payer: Medicare Other | Admitting: Psychiatry

## 2023-12-13 ENCOUNTER — Other Ambulatory Visit: Payer: Self-pay

## 2023-12-13 DIAGNOSIS — F411 Generalized anxiety disorder: Secondary | ICD-10-CM

## 2023-12-13 MED ORDER — FLUOXETINE HCL 40 MG PO CAPS: 40.0000 mg | ORAL_CAPSULE | Freq: Every day | ORAL | 0 refills | Status: DC

## 2023-12-13 NOTE — Telephone Encounter (Signed)
 Next visit is 01/09/24. Cassandra Little is taking Prozac. She is asking what dosage should she be taking? Her phone number is [phone]. She is requesting a refill on it. Pharmacy is:  [id number] - [place], [place] - [address]   Phone: [phone]  Fax: [phone]

## 2023-12-13 NOTE — Progress Notes (Signed)
 [facility] Counselor/Therapist Progress Note  Patient ID: Cassandra Little, MRN: [id number],    Date: 12/13/2023  Time Spent: 60 minutes   Treatment Type: Individual Therapy  Reported Symptoms: anxiety, depression   Mental Status Exam:  Appearance:   Neat and Well Groomed     Behavior:  Appropriate, Sharing, and Motivated  Motor:  Normal  Speech/Language:   Clear and Coherent  Affect:  Depressed and anxious;  Mood:  anxious, depressed, irritable, and sad  Thought process:  normal  Thought content:    Rumination  Sensory/Perceptual disturbances:    WNL  Orientation:  oriented to person, place, time/date, situation, day of week, month of year, year, and stated date of Feb. 28, 2025  Attention:  Good  Concentration:  Good  Memory:  WNL  Fund of knowledge:   Good  Insight:    Good  Judgment:   Good  Impulse Control:  Good   Risk Assessment: Danger to Self:  No Self-injurious Behavior: No Danger to Others: No Duty to Warn:no Physical Aggression / Violence:No  Access to Firearms a concern: No  Gang Involvement:No   Subjective:   Patient in today after being seen by therapist at a different location.  It has been a while since patient was seen and she updated therapist on some of the changes in her life as well as current struggles that have led her to getting back into therapy.  Patient is having interpersonal issues with both her adult son and adult daughter, she is very sad about her long-term dog who is having significant health issues, patient herself has had a few health concerns but seems to be good at this point for her age, is seeking a faith community, is fearful of aging, and states that "I want to be functioning and be at peace.  Main symptoms seem to involve depression and anxiety with depression being the stronger symptom.  Interventions: Cognitive Behavioral Therapy and Ego-Supportive 1.Reduce irritability, anxiety, and depression and increase normal social  interaction with family and friends. 2.Elevate mood and show evidence of usual energy, activities, and socialization level. 3. Develop healthy interpersonal relationships that lead to alleviation of some anxiety and depression and help prevent the relapse of those symptoms.  Diagnosis:   ICD-10-CM   1. Generalized anxiety disorder  F41.1       Plan: Patient today seen after having seen therapist at a different location a while back.  She was able to update me in the session as far as changes in her life, her priorities, and current struggles that have led patient to return to therapy.  She is also on medication to help with anxious and depressive mood.  Seems very motivated today, almost expectant of instant changes which we talked about some and which she has struggled with some in the past.  I encouraged her motivation however and particularly to think more about between now and when she comes back for her next session ways that she can better interact with family where there is still a lot of tension and hurt.  That seems to be a real focal issue for her and seems to impact multiple areas of her life.  Difficulty in setting boundaries that are healthy.  Patient had a lot to share and talk about today and we had a name a few things that we will begin with next session as we ran out of time today.  Is to make some notes between now and  next session then 2 weeks.  Completed initial treatment goal plan with patient.  Goal review and patient seems motivated at this point.  Next appointment within 2 weeks.   Mathis Fare, LCSW

## 2023-12-13 NOTE — Telephone Encounter (Signed)
 Rtc to pt and after reviewing last office note pt is taking Prozac 20 mg 2 daily, she reports doing well no side effects or issues and asking for updated Rx. Informed her I would update her Rx to get filled today.

## 2023-12-19 ENCOUNTER — Encounter: Payer: Self-pay | Admitting: Internal Medicine

## 2023-12-19 NOTE — Telephone Encounter (Signed)
 Send Nexium 40 mg 1 tablet daily, 24-month supply. === Chart reviewed: CBC normal, EGD 2020 HH otherwise normal

## 2023-12-20 MED ORDER — ESOMEPRAZOLE MAGNESIUM 40 MG PO CPDR: 40.0000 mg | DELAYED_RELEASE_CAPSULE | Freq: Every day | ORAL | 1 refills | Status: DC

## 2023-12-20 NOTE — Telephone Encounter (Signed)
 Rx sent

## 2023-12-20 NOTE — Addendum Note (Signed)
 Addended by: Conrad [place] D on: 12/20/2023 07:36 AM   Modules accepted: Orders

## 2023-12-21 ENCOUNTER — Other Ambulatory Visit: Payer: Self-pay | Admitting: Medical

## 2023-12-25 ENCOUNTER — Ambulatory Visit: Payer: Medicare Other | Admitting: Psychiatry

## 2023-12-25 DIAGNOSIS — F411 Generalized anxiety disorder: Secondary | ICD-10-CM

## 2023-12-25 NOTE — Progress Notes (Signed)
 [facility] Counselor/Therapist Progress Note  Patient ID: Cassandra Little, MRN: [id number],    Date: 12/25/2023  Time Spent: 55 minutes   Treatment Type: Individual Therapy  Reported Symptoms: anxiety, depression    Mental Status Exam:  Appearance:   Neat and Well Groomed     Behavior:  Appropriate, Sharing, and Motivated  Motor:  Normal  Speech/Language:   Clear and Coherent  Affect:  Depressed and anxious  Mood:  anxious  Thought process:  goal directed  Thought content:    Rumination  Sensory/Perceptual disturbances:    WNL  Orientation:  oriented to person, place, time/date, situation, day of week, month of year, year, and stated date of December 25, 2023  Attention:  Good  Concentration:  Good  Memory:  WNL  Fund of knowledge:   Good  Insight:    Good  Judgment:   Good  Impulse Control:  Good   Risk Assessment: Danger to Self:  No Self-injurious Behavior: No Danger to Others: No Duty to Warn:no Physical Aggression / Violence:No  Access to Firearms a concern: No  Gang Involvement:No   Subjective: Patient in session today to further her work on current struggles especially interpersonally with her adult son and adult daughter, her sadness regarding her long-term dog who is having significant health issues, health issues that patient herself has had but seems to be improving, her search for a faith community that fits for her, fearful about aging, and states "I want to be functioning and be at peace".  Notes that she is making progress but some symptoms persist although feels that recent med change has made a lot of difference in a positive way.  In talking with patient more today it does seem that her main symptoms seem to involve depression and anxiety and especially how some of these play out within family relationships and hurt. "It's anxiety provoking for me to think about upcoming trip in April."Feels that she is better than a few wks ago as her meds have been  changed some which has been a real help. Talking through family transitions and re-arranges which she needed to focus on further today.  (Not all details included in this note due to privacy needs.) working to have better limits on how she lets adult children treat her.  Also better acceptance and understanding that change does not happen always rapidly.  Interventions: Cognitive Behavioral Therapy and Ego-Supportive  1.Reduce irritability, anxiety, and depression and increase normal social interaction with family and friends. 2.Elevate mood and show evidence of usual energy, activities, and socialization level. 3. Develop healthy interpersonal relationships that lead to alleviation of some anxiety and depression and help prevent the relapse of those symptoms.  Diagnosis:   ICD-10-CM   1. Generalized anxiety disorder  F41.1      Plan:   Patient today showing good motivation and participation in session.  Following up on initial treatment goals and also a review of how things have been since her last appointment.  Several positives noted since last appointment including manage change which she feels has helped a lot.  Focus today more on some individual issues for her as well as ways of better interacting with family when there is significant tension and at times her.  This seems to be at the heart of several of her issues and struggles in life.  Setting boundaries that are healthy is a challenge and we discussed how she might work on these and could actually  help improve and preserve some healthier family relationships.  Patient is committed to making progress and needs to continue her work with goal-directed behaviors in order to experience progress and move forward in a healthier and more hopeful direction.  Goal review and progress/challenges noted with patient.  Next appointment within 2 weeks.   Mathis Fare, LCSW

## 2023-12-28 ENCOUNTER — Other Ambulatory Visit: Payer: Self-pay | Admitting: Internal Medicine

## 2023-12-30 ENCOUNTER — Other Ambulatory Visit (HOSPITAL_COMMUNITY): Payer: Self-pay

## 2023-12-30 ENCOUNTER — Other Ambulatory Visit: Payer: Self-pay

## 2023-12-30 MED ORDER — HYDROCORTISONE (PERIANAL) 2.5 % EX CREA
1.0000 | TOPICAL_CREAM | Freq: Two times a day (BID) | CUTANEOUS | 1 refills | Status: AC | PRN
  Filled 2023-12-30: qty 30, 30d supply, fill #0
  Filled 2024-01-09 (×2): qty 30, 10d supply, fill #0

## 2023-12-31 ENCOUNTER — Other Ambulatory Visit: Payer: Self-pay

## 2024-01-09 ENCOUNTER — Other Ambulatory Visit (HOSPITAL_COMMUNITY): Payer: Self-pay

## 2024-01-09 ENCOUNTER — Other Ambulatory Visit: Payer: Self-pay

## 2024-01-09 ENCOUNTER — Ambulatory Visit: Payer: Medicare Other | Admitting: Physician Assistant

## 2024-01-15 ENCOUNTER — Telehealth: Payer: Self-pay | Admitting: Physician Assistant

## 2024-01-15 ENCOUNTER — Other Ambulatory Visit: Payer: Self-pay | Admitting: Physician Assistant

## 2024-01-15 DIAGNOSIS — F411 Generalized anxiety disorder: Secondary | ICD-10-CM

## 2024-01-15 NOTE — Telephone Encounter (Signed)
 Cassandra Little called at 10:54 to request refills of her Buspar and Prozac.  She is going out of the country 4/25 and needs to get the refills before she goes so she won't run out while she is gone.  These will be early refills.  Has appt 5/16.  [facility] to  [id number] - [place], [place] - [address]

## 2024-01-15 NOTE — Telephone Encounter (Signed)
 Pt called about Fluoxetine and Buspirone RF. Stated out of town for 3 weeks leaving 4/25. Trying to avoid running out. Takes days to recover, she said. Apt  5/16

## 2024-01-15 NOTE — Telephone Encounter (Signed)
 [facility] Notified her. RF sent in Feb.

## 2024-01-17 NOTE — Telephone Encounter (Signed)
 Pt notified on 4/2 that 90-day refills were sent the end of February. Will call the pharmacy to see if she picked up.

## 2024-01-20 ENCOUNTER — Telehealth: Payer: Self-pay | Admitting: Physician Assistant

## 2024-01-20 ENCOUNTER — Ambulatory Visit: Admitting: Psychiatry

## 2024-01-20 DIAGNOSIS — F4323 Adjustment disorder with mixed anxiety and depressed mood: Secondary | ICD-10-CM

## 2024-01-20 NOTE — Telephone Encounter (Signed)
 Pt in office to see DD. Reporting [facility] advised [facility]'t fill [facility] without provider authorizing. Please contact pt @ [phone]

## 2024-01-20 NOTE — Progress Notes (Signed)
 [facility] Counselor/Therapist Progress Note  Patient ID: Cassandra Little, MRN: [id number],    Date: 01/20/2024  Time Spent: 55 minutes   Treatment Type: Individual Therapy  Reported Symptoms: anxiety, "fearful and concerned about grand-daughter (20) going to [place] re: school", stressed with her own trip to [place] coming up"  Mental Status Exam:  Appearance:   Casual and Neat     Behavior:  Appropriate, Sharing, and Motivated  Motor:  Normal  Speech/Language:   Clear and Coherent  Affect:  Appropriate  Mood:  anxious and depressed  Thought process:  goal directed  Thought content:    WNL  Sensory/Perceptual disturbances:    WNL  Orientation:  oriented to person, place, time/date, situation, day of week, month of year, year, and stated date of January 20, 2024  Attention:  Good  Concentration:  Good and Fair  Memory:  WNL  Fund of knowledge:   Good  Insight:    Good and Fair  Judgment:   Good  Impulse Control:  Good   Risk Assessment: Danger to Self:  No Self-injurious Behavior: No Danger to Others: No Duty to Warn:no Physical Aggression / Violence:No  Access to Firearms a concern: No  Gang Involvement:No   Subjective:   Patient actively participating in session today as she continues working on interpersonal and communication issues with her adult son and adult daughter (which influences her anxiety and depression) , young adult grand-daughter difficulty setting appropriate boundaries and limits with her adult children, sadness regarding her long-term dog who is having significant health issues, health issues that patient has been having but noticing improvement, frustration and her search for faith community that fits for her, fears about aging, and wanting "to be more functional and at peace".  Working on family issues and transitions is very difficult for patient, difficult for her to have healthy boundaries, and a challenge for her in setting limits in her own best  interest.  Worked on these issues more in session today as she expresses she would like more peace within her family relationships.  Discussed certain communication skills and how others within the family tend to communicate with her and consider some changes that patient might work on in her own communication skills and also in being able to set limits within the family.  Patient does feel in some ways she is making progress "in that I know that I pray for things and wisdom and discernment and it does help me."  Continues working on changing some of her communication within the family and not allowing herself to be mistreated, and this is a "working progress".  Significant challenges within the family.  (Not all details included in this note).  Patient continues to work on setting and trying to keep better limits with others including within the family, and how she allows herself to be treated.  Working on these changes and understanding tha they do not necessarily happen quickly and trying to stay motivated and pay more attention to how she allows people to treat her.  Interventions: Cognitive Behavioral Therapy and Ego-Supportive  1.Reduce irritability, anxiety, and depression and increase normal social interaction with family and friends. 2.Elevate mood and show evidence of usual energy, activities, and socialization level. 3. Develop healthy interpersonal relationships that lead to alleviation of some anxiety and depression and help prevent the relapse of those symptoms.  Diagnosis:   ICD-10-CM   1. Situational mixed anxiety and depressive disorder  F43.23  Plan:   Patient today in session participating well and showing good motivation as she continues to work on her multiple personal and family issues and sees some progress.  Also difficult to set some limits and boundaries that might be helpful for her.  She has manage certain changes that do not involve family, more effectively.   Significant tension remains within the family and she reports sometimes it is not as noticeable as others.  Patient remains very committed to making progress in these to work further on her goal-directed behaviors to stay more focused and experience progress even if certain changes feel difficult, in order for her to move forward in a healthier and more hopeful direction.  Goal review and progress/challenges noted with patient.  Next appointment within 2 to 3 weeks.   Mathis Fare, LCSW

## 2024-01-20 NOTE — Telephone Encounter (Signed)
 Pt going out of the country 4/25. Call pharmacy to see when she can next RF.

## 2024-01-21 NOTE — Telephone Encounter (Signed)
 Patient is not due for a RF until 5/7. Paying OOP with [facility] would be approximately $30 each. Pharmacist suggested she call her insurance and see if they would do a vacation override. Patient was notified.

## 2024-01-24 ENCOUNTER — Other Ambulatory Visit: Payer: Self-pay | Admitting: Internal Medicine

## 2024-02-15 ENCOUNTER — Other Ambulatory Visit: Payer: Self-pay | Admitting: Physician Assistant

## 2024-02-28 ENCOUNTER — Encounter: Payer: Self-pay | Admitting: Physician Assistant

## 2024-02-28 ENCOUNTER — Ambulatory Visit: Admitting: Physician Assistant

## 2024-02-28 DIAGNOSIS — F411 Generalized anxiety disorder: Secondary | ICD-10-CM

## 2024-02-28 DIAGNOSIS — F32A Depression, unspecified: Secondary | ICD-10-CM | POA: Diagnosis not present

## 2024-02-28 MED ORDER — FLUOXETINE HCL 40 MG PO CAPS: 40.0000 mg | ORAL_CAPSULE | Freq: Every day | ORAL | 1 refills | Status: DC

## 2024-02-28 MED ORDER — BUSPIRONE HCL 15 MG PO TABS: 15.0000 mg | ORAL_TABLET | Freq: Three times a day (TID) | ORAL | 1 refills | Status: DC

## 2024-02-28 MED ORDER — ALPRAZOLAM 0.5 MG PO TABS: 0.5000 mg | ORAL_TABLET | Freq: Two times a day (BID) | ORAL | 2 refills | Status: AC | PRN

## 2024-02-28 NOTE — Progress Notes (Signed)
 [facility]  Patient ID: Cassandra Little,  MRN: [id number]  PCP: Ezell Hollow, MD  Date of Evaluation: 02/28/2024 Time spent:20 minutes  Chief Complaint:  Chief Complaint   Anxiety; Depression; Follow-up    HISTORY/CURRENT STATUS: HPI  For routine med check.   We stopped Cymbalta  and restarted Prozac  at the last visit in February.  She is doing much better.  She feels like the Prozac  is more effective.  She is able to enjoy things.  She just returned from [place] on a pilgrimage for 3 weeks and had a great time.  Energy and motivation are good.  ADLs and personal hygiene are normal.  Appetite is normal and weight is stable.  She cries easily but that has always been an issue for her and it is no worse.  She is sad about her dog who is not doing well and probably will not live much longer.  She is anxious about that.  She takes Xanax  occasionally but states she will probably need it when her dog does pass away.  No suicidal or homicidal thoughts are reported.  Patient denies increased energy with decreased need for sleep, increased talkativeness, racing thoughts, impulsivity or risky behaviors, increased spending, increased libido, grandiosity, increased irritability or anger, paranoia, or hallucinations.  Denies dizziness, syncope, seizures, numbness, tingling, tremor, tics, unsteady gait, slurred speech, confusion. Denies muscle or joint pain, stiffness, or dystonia.  Individual Medical History/ Review of Systems: Changes? :No   Past Psychiatric Medication Trials: Prozac : Took for 20 years.  Lexapro - Seemed to be ok.  Wellbutrin  XL- No improvement or side effects.  Cymbalta - Xanax   Allergies: Ace inhibitors  Current Medications:  Current Outpatient Medications:    b complex vitamins tablet, Take 1 tablet by mouth daily., Disp: , Rfl:    Bacillus Coagulans-Inulin (PROBIOTIC-PREBIOTIC PO), Take by mouth., Disp: , Rfl:    cetirizine (ZYRTEC) 10 MG tablet, Take 10 mg by  mouth daily as needed for allergies., Disp: , Rfl:    cholecalciferol (VITAMIN D ) 1000 units tablet, Take 1,000 Units by mouth daily., Disp: , Rfl:    COLLAGEN PO, Take 1 Scoop by mouth daily., Disp: , Rfl:    eletriptan  (RELPAX ) 20 MG tablet, Take 1 tablet (20 mg total) by mouth as needed for migraine or headache. May repeat in 2 hours if headache persists or recurs., Disp: 10 tablet, Rfl: 5   esomeprazole  (NEXIUM ) 40 MG capsule, Take 1 capsule (40 mg total) by mouth daily before breakfast., Disp: 90 capsule, Rfl: 1   ezetimibe  (ZETIA ) 10 MG tablet, TAKE 1 TABLET BY MOUTH DAILY, Disp: 90 tablet, Rfl: 2   fluticasone  (FLONASE ) 50 MCG/ACT nasal spray, Place 2 sprays into both nostrils daily., Disp: 48 mL, Rfl: 1   metFORMIN  (GLUCOPHAGE -XR) 500 MG 24 hr tablet, Take 1 tablet (500 mg total) by mouth 2 (two) times daily with a meal., Disp: 180 tablet, Rfl: 3   naproxen sodium (ALEVE) 220 MG tablet, Take 220 mg by mouth daily as needed., Disp: , Rfl:    rosuvastatin  (CRESTOR ) 20 MG tablet, TAKE 1 TABLET BY MOUTH DAILY, Disp: 90 tablet, Rfl: 0   ALPRAZolam  (XANAX ) 0.5 MG tablet, Take 1 tablet (0.5 mg total) by mouth 2 (two) times daily as needed for anxiety., Disp: 30 tablet, Rfl: 2   busPIRone  (BUSPAR ) 15 MG tablet, Take 1 tablet (15 mg total) by mouth 3 (three) times daily., Disp: 270 tablet, Rfl: 1   EPINEPHrine  (EPIPEN  2-PAK) 0.3 mg/0.3 mL IJ  SOAJ injection, INJECT 0.3 MLS (0.3 MG TOTAL) INTO THE MUSCLE ONCE. (Patient not taking: Reported on 02/28/2024), Disp: 2 each, Rfl: 1   FLUoxetine  (PROZAC ) 40 MG capsule, Take 1 capsule (40 mg total) by mouth daily., Disp: 90 capsule, Rfl: 1   hydrocortisone  (ANUSOL -HC) 2.5 % rectal cream, Place 1 Application rectally 2 (two) times daily as needed for hemorrhoids or anal itching. (Patient not taking: Reported on 02/28/2024), Disp: 30 g, Rfl: 1 Medication Side Effects: none  Family Medical/ Social History: Changes? No  MENTAL HEALTH EXAM:  There were no vitals  taken for this visit.There is no height or weight on file to calculate BMI.  General Appearance: Casual and Well Groomed  Eye Contact:  Good  Speech:  Clear and Coherent and Normal Rate  Volume:  Normal  Mood:  Euthymic  Affect:  Tearful when she talks about her dog  Thought Process:  Goal Directed and Descriptions of Associations: Circumstantial  Orientation:  Full (Time, Place, and Person)  Thought Content: Logical   Suicidal Thoughts:  No  Homicidal Thoughts:  No  Memory:  WNL  Judgement:  Good  Insight:  Good  Psychomotor Activity:  Normal  Concentration:  Concentration: Good  Recall:  Good  Fund of Knowledge: Good  Language: Good  Assets:  Communication Skills Desire for Improvement Financial Resources/Insurance Housing Transportation  ADL's:  Intact  Cognition: WNL  Prognosis:  Good   DIAGNOSES:    ICD-10-CM   1. Depression, unspecified depression type  F32.A     2. Generalized anxiety disorder  F41.1 busPIRone  (BUSPAR ) 15 MG tablet    ALPRAZolam  (XANAX ) 0.5 MG tablet     Receiving Psychotherapy: Yes with Reid Capuchin, LCSW.  RECOMMENDATIONS:  PDMP reviewed. Xanax  filled 11/19/2023. I provided 20 minutes of face to face time during this encounter, including time spent before and after the visit in records review, medical decision making, counseling pertinent to today's visit, and charting.   Overall she is doing much better on the Prozac  versus Cymbalta .  No changes will be made.  Continue Xanax  0.5 mg, 1 p.o. twice daily as needed. Continue BuSpar  15 mg, 1 p.o. 3 times daily. Continue Prozac  40 mg daily. Continue therapy with Reid Capuchin, LCSW. Return in 6 months.   Marvia Slocumb, PA-C

## 2024-03-04 ENCOUNTER — Ambulatory Visit: Admitting: Psychiatry

## 2024-03-23 ENCOUNTER — Other Ambulatory Visit: Payer: Self-pay

## 2024-03-23 MED ORDER — ESOMEPRAZOLE MAGNESIUM 40 MG PO CPDR: 40.0000 mg | DELAYED_RELEASE_CAPSULE | Freq: Every day | ORAL | 1 refills | Status: DC

## 2024-03-27 ENCOUNTER — Other Ambulatory Visit: Payer: Self-pay | Admitting: Internal Medicine

## 2024-05-01 ENCOUNTER — Other Ambulatory Visit: Payer: Self-pay | Admitting: Internal Medicine

## 2024-05-14 ENCOUNTER — Other Ambulatory Visit: Payer: Self-pay | Admitting: Internal Medicine

## 2024-06-15 ENCOUNTER — Other Ambulatory Visit: Payer: Self-pay | Admitting: Internal Medicine

## 2024-06-17 MED ORDER — EZETIMIBE 10 MG PO TABS: 10.0000 mg | ORAL_TABLET | Freq: Every day | ORAL | 0 refills | Status: DC

## 2024-06-20 ENCOUNTER — Other Ambulatory Visit: Payer: Self-pay | Admitting: Internal Medicine

## 2024-06-25 ENCOUNTER — Other Ambulatory Visit: Payer: Self-pay | Admitting: Family

## 2024-06-25 ENCOUNTER — Encounter: Payer: Self-pay | Admitting: Internal Medicine

## 2024-06-25 DIAGNOSIS — K2100 Gastro-esophageal reflux disease with esophagitis, without bleeding: Secondary | ICD-10-CM

## 2024-06-29 ENCOUNTER — Other Ambulatory Visit: Payer: Self-pay | Admitting: Internal Medicine

## 2024-07-06 ENCOUNTER — Encounter: Payer: Self-pay | Admitting: Gastroenterology

## 2024-08-05 ENCOUNTER — Other Ambulatory Visit: Payer: Self-pay | Admitting: Physician Assistant

## 2024-08-08 ENCOUNTER — Other Ambulatory Visit: Payer: Self-pay | Admitting: Internal Medicine

## 2024-08-27 ENCOUNTER — Ambulatory Visit: Admitting: Gastroenterology

## 2024-08-27 ENCOUNTER — Encounter: Payer: Self-pay | Admitting: Gastroenterology

## 2024-08-27 VITALS — BP 134/84 | HR 92 | Ht 60.0 in | Wt 160.2 lb

## 2024-08-27 DIAGNOSIS — K219 Gastro-esophageal reflux disease without esophagitis: Secondary | ICD-10-CM | POA: Diagnosis not present

## 2024-08-27 DIAGNOSIS — R6881 Early satiety: Secondary | ICD-10-CM | POA: Diagnosis not present

## 2024-08-27 DIAGNOSIS — R630 Anorexia: Secondary | ICD-10-CM | POA: Diagnosis not present

## 2024-08-27 DIAGNOSIS — R110 Nausea: Secondary | ICD-10-CM

## 2024-08-27 MED ORDER — FAMOTIDINE 20 MG PO TABS: 20.0000 mg | ORAL_TABLET | Freq: Every day | ORAL | 3 refills | Status: AC

## 2024-08-27 NOTE — Progress Notes (Signed)
 Cassandra Little [id number] 1946/05/16   Chief Complaint: Discuss EGD, GERD  Referring Provider: Amon Aloysius BRAVO, MD Primary GI MD: Dr. Avram  HPI: Cassandra Little is a 78 y.o. female with past medical history of anxiety/depression, arthritis, GERD, HLD, HTN, IBS, hemorrhoids s/p banding, appendectomy who presents today for a complaint of GERD and to discuss EGD.    Last seen in office 09/23/2018 by Dr. Avram for iron deficiency anemia.  Was scheduled for EGD and colonoscopy as well as celiac testing at that time.  There was question of contribution of blood donation to the iron deficiency.  Labs 10/25/2023: Normal CBC, elevated creatinine which on repeat was normal, hemoglobin A1c 6.1, normal vitamin D    Discussed the use of AI scribe software for clinical note transcription with the patient, who gave verbal consent to proceed.  History of Present Illness Cassandra Little Silent H is a 78 year old female with gastroesophageal reflux disease who presents with worsening reflux symptoms.  Gastroesophageal reflux symptoms - Worsening reflux symptoms over the past six months - Persistent burning sensation in throat and esophagus - Nausea and discomfort, especially with eating - Breakthrough symptoms despite increased Nexium  dose to 40 mg once daily for two to three months - No vomiting - No significant abdominal pain - No trouble swallowing - No blood in stool or melena  Appetite and dietary habits - Decreased appetite - Tendency to eat smaller meals due to discomfort  Lifestyle and psychosocial factors - Active lifestyle with frequent exercise - Living alone affects meal planning and dietary choices - Stress may exacerbate gastrointestinal symptoms - Internalization of emotions perceived to affect gut health   Previous GI Procedures/Imaging   Colonoscopy 11/26/2018 - One 2 mm polyp in the rectum, removed with a cold snare. Resected and retrieved.  - Diverticulosis  in the sigmoid colon.  - The examined portion of the ileum was normal.  - The examination was otherwise normal on direct and retroflexion views. - No recall due to age Path: Surgical [P], rectum, polyp - HYPERPLASTIC POLYP (1 OF 1 FRAGMENTS) - NO HIGH GRADE DYSPLASIA OR MALIGNANCY IDENTIFIED  EGD 11/26/2018 - Small hiatal hernia.  - The examination was otherwise normal.  - No specimens collected.  Past Medical History:  Diagnosis Date   Anaphylaxis 02/14/2011   Unknown cause.  Always carries Epipen .   Angioedema    Anxiety    Arthritis    Cough    Depression    Dr. Alroy   Exposure to hepatitis C    GERD (gastroesophageal reflux disease)    Hyperlipidemia    Hypertension    IBS (irritable bowel syndrome)    Internal and external bleeding hemorrhoids    Migraine    PONV (postoperative nausea and vomiting)    Rosacea     Past Surgical History:  Procedure Laterality Date   ABDOMINOPLASTY  2000   APPENDECTOMY  2004   AUGMENTATION MAMMAPLASTY Bilateral 2011   CESAREAN SECTION  1982, 1985   x 2   COLONOSCOPY  09/15/2008   internal and external hemorroids   COSMETIC SURGERY     multiple   HEMORRHOID BANDING  2014   REDUCTION MAMMAPLASTY Bilateral 2002   TONSILLECTOMY  1962   TOTAL KNEE ARTHROPLASTY Right 12/26/2015   Procedure: TOTAL KNEE ARTHROPLASTY;  Surgeon: Donnice Car, MD;  Location: [facility];  Service: Orthopedics;  Laterality: Right;   TOTAL KNEE ARTHROPLASTY Left 04/25/2021   Procedure: TOTAL KNEE ARTHROPLASTY;  Surgeon: Ernie Cough, MD;  Location: [facility];  Service: Orthopedics;  Laterality: Left;  70 mins   TOTAL SHOULDER REPLACEMENT Bilateral 2009   right   TOTAL SHOULDER REPLACEMENT     UPPER GASTROINTESTINAL ENDOSCOPY  11/26/2018    Current Outpatient Medications  Medication Sig Dispense Refill   ALPRAZolam  (XANAX ) 0.5 MG tablet Take 1 tablet (0.5 mg total) by mouth 2 (two) times daily as needed for anxiety. 30 tablet 2   b complex vitamins tablet  Take 1 tablet by mouth daily.     Bacillus Coagulans-Inulin (PROBIOTIC-PREBIOTIC PO) Take by mouth.     busPIRone  (BUSPAR ) 15 MG tablet Take 1 tablet (15 mg total) by mouth 3 (three) times daily. 270 tablet 1   cetirizine (ZYRTEC) 10 MG tablet Take 10 mg by mouth daily as needed for allergies.     cholecalciferol (VITAMIN D ) 1000 units tablet Take 1,000 Units by mouth daily.     COLLAGEN PO Take 1 Scoop by mouth daily.     eletriptan  (RELPAX ) 20 MG tablet Take 1 tablet (20 mg total) by mouth as needed for migraine or headache. May repeat in 2 hours if headache persists or recurs. 10 tablet 5   EPINEPHrine  (EPIPEN  2-PAK) 0.3 mg/0.3 mL IJ SOAJ injection INJECT 0.3 MLS (0.3 MG TOTAL) INTO THE MUSCLE ONCE. 2 each 1   esomeprazole  (NEXIUM ) 40 MG capsule TAKE 1 CAPSULE BY MOUTH EVERY DAY BEFORE BREAKFAST 90 capsule 1   ezetimibe  (ZETIA ) 10 MG tablet Take 1 tablet (10 mg total) by mouth daily. 15 tablet 0   famotidine (PEPCID) 20 MG tablet Take 1 tablet (20 mg total) by mouth at bedtime. 30 tablet 3   FLUoxetine  (PROZAC ) 40 MG capsule TAKE 1 CAPSULE BY MOUTH DAILY 90 capsule 0   fluticasone  (FLONASE ) 50 MCG/ACT nasal spray Place 2 sprays into both nostrils daily. 48 mL 1   hydrocortisone  (ANUSOL -HC) 2.5 % rectal cream Place 1 Application rectally 2 (two) times daily as needed for hemorrhoids or anal itching. 30 g 1   metFORMIN  (GLUCOPHAGE -XR) 500 MG 24 hr tablet Take 1 tablet (500 mg total) by mouth 2 (two) times daily with a meal. 180 tablet 3   naproxen sodium (ALEVE) 220 MG tablet Take 220 mg by mouth daily as needed.     rosuvastatin  (CRESTOR ) 20 MG tablet TAKE 1 TABLET BY MOUTH DAILY 15 tablet 0   No current facility-administered medications for this visit.    Allergies as of 08/27/2024 - Review Complete 08/27/2024  Allergen Reaction Noted   Ace inhibitors Cough 08/20/2008    Family History  Problem Relation Age of Onset   Hyperlipidemia Mother    Heart disease Mother    Allergic rhinitis  Mother    Eczema Mother    Diabetes Father    Hypertension Father    Heart attack Father        48s   Heart disease Father    Obesity Sister    Hypertension Sister    Hyperlipidemia Sister    Anxiety disorder Daughter    Depression Daughter    Depression Son    Colon cancer Neg Hx    Stomach cancer Neg Hx    Rectal cancer Neg Hx    Pancreatic cancer Neg Hx    Esophageal cancer Neg Hx    Angioedema Neg Hx    Asthma Neg Hx    Atopy Neg Hx    Immunodeficiency Neg Hx    Urticaria Neg Hx  Breast cancer Neg Hx     Social History   Tobacco Use   Smoking status: Never   Smokeless tobacco: Never  Vaping Use   Vaping status: Never Used  Substance Use Topics   Alcohol  use: Yes    Comment: Infrequently   Drug use: No     Review of Systems:    Constitutional: No weight loss, fever, chills Cardiovascular: No chest pain Respiratory: No SOB Gastrointestinal: See HPI and otherwise negative   Physical Exam:  Vital signs: BP 134/84   Pulse 92   Ht 5' (1.524 m)   Wt 160 lb 4 oz (72.7 kg)   BMI 31.30 kg/m   Wt Readings from Last 3 Encounters:  08/27/24 160 lb 4 oz (72.7 kg)  11/29/23 161 lb (73 kg)  10/29/23 160 lb (72.6 kg)   Constitutional: Pleasant, well-appearing female in NAD, does become anxious and tearful discussing her recent worsening of symptoms, alert and cooperative Head:  Normocephalic and atraumatic.  Eyes: No scleral icterus.  Respiratory: Respirations even and unlabored. Lungs clear to auscultation bilaterally.  No wheezes, crackles, or rhonchi.  Cardiovascular:  Regular rate and rhythm. No murmurs. No peripheral edema. Gastrointestinal:  Soft, nondistended, nontender. No rebound or guarding. Normal bowel sounds. No appreciable masses or hepatomegaly. Rectal:  Not performed.  Neurologic:  Alert and oriented x4;  grossly normal neurologically.  Skin:   Dry and intact without significant lesions or rashes. Psychiatric: Oriented to person, place and  time. Demonstrates good judgement and reason without abnormal affect or behaviors.   RELEVANT LABS AND IMAGING: CBC    Component Value Date/Time   WBC 6.5 10/25/2023 1153   RBC 4.95 10/25/2023 1153   HGB 14.1 10/25/2023 1153   HGB 11.7 11/27/2018 1433   HGB 12.8 01/05/2015 0922   HCT 44.0 10/25/2023 1153   HCT 37.2 11/27/2018 1433   PLT 228 10/25/2023 1153   PLT 272 11/27/2018 1433   MCV 88.9 10/25/2023 1153   MCV 76 (L) 11/27/2018 1433   MCH 28.5 10/25/2023 1153   MCHC 32.0 10/25/2023 1153   RDW 13.7 10/25/2023 1153   RDW 20.5 (H) 11/27/2018 1433   LYMPHSABS 1.5 07/09/2022 1057   LYMPHSABS 2.3 11/27/2018 1433   MONOABS 0.5 07/09/2022 1057   EOSABS 182 10/25/2023 1153   EOSABS 0.1 11/27/2018 1433   BASOSABS 52 10/25/2023 1153   BASOSABS 0.0 11/27/2018 1433    CMP     Component Value Date/Time   NA 139 11/15/2023 0803   NA 142 11/27/2018 1433   K 4.6 11/15/2023 0803   CL 104 11/15/2023 0803   CO2 27 11/15/2023 0803   GLUCOSE 95 11/15/2023 0803   GLUCOSE 87 09/06/2010 0000   BUN 26 (H) 11/15/2023 0803   BUN 14 11/27/2018 1433   CREATININE 0.81 11/15/2023 0803   CREATININE 1.22 (H) 10/25/2023 1153   CALCIUM  9.4 11/15/2023 0803   PROT 7.1 10/25/2023 1153   PROT 6.9 11/27/2018 1433   ALBUMIN 4.3 07/09/2022 1057   ALBUMIN 4.2 11/27/2018 1433   AST 21 10/25/2023 1153   ALT 23 10/25/2023 1153   ALKPHOS 63 07/09/2022 1057   BILITOT 0.5 10/25/2023 1153   BILITOT <0.2 11/27/2018 1433   GFRNONAA >60 04/26/2021 0256   GFRAA 55 (L) 11/27/2018 1433     Assessment/Plan:   Assessment & Plan Gastroesophageal reflux disease (GERD) with breakthrough symptoms Nausea Early satiety Decreased appetite GERD with recent worsening of symptoms about 6 months ago, and persistent symptoms despite  increasing medication to Nexium  40 mg daily.  Has been experiencing nausea as well which is new, as well as early satiety and decreased appetite.  Consider possible element of functional  dyspepsia as symptoms seem to be worse with stress. Labs stable, no significant weight loss, dysphagia, or abdominal pain. Last endoscopy five years ago.  Will plan to repeat EGD based on recent worsening of symptoms not completely controlled on daily Nexium  and new nausea, early satiety, and decreased appetite.  - Schedule upper endoscopy. I thoroughly discussed the procedure with the patient to include nature of the procedure, alternatives, benefits, and risks (including but not limited to bleeding, infection, perforation, anesthesia/cardiac/pulmonary complications). Patient verbalized understanding and gave verbal consent to proceed with procedure.  - Prescribed famotidine 20 mg at night for breakthrough symptoms. - Discussed potential increase in Nexium  to twice daily if symptoms persist.    Camie Furbish, PA-C [facility] 08/27/2024, 11:30 AM  Patient Care Team: Amon Aloysius BRAVO, MD as PCP - General (Internal Medicine) Ernie Cough, MD as Consulting Physician (Orthopedic Surgery) Cleotilde Ronal RAMAN, MD as Consulting Physician (Gynecology) Vincente Grip, MD as Consulting Physician (Psychiatry)

## 2024-08-27 NOTE — Patient Instructions (Signed)
 We have sent the following medications to your pharmacy for you to pick up at your convenience: Famotidine 20 mg at bedtime.   Continue Nexium  40 mg once daily.   You have been scheduled for an endoscopy. Please follow written instructions given to you at your visit today.  If you use inhalers (even only as needed), please bring them with you on the day of your procedure.  If you take any of the following medications, they will need to be adjusted prior to your procedure:   DO NOT TAKE 7 DAYS PRIOR TO TEST- Trulicity (dulaglutide) Ozempic, Wegovy (semaglutide) Mounjaro, Zepbound (tirzepatide) Bydureon Bcise (exanatide extended release)  DO NOT TAKE 1 DAY PRIOR TO YOUR TEST Rybelsus (semaglutide) Adlyxin (lixisenatide) Victoza (liraglutide) Byetta (exanatide) ____________________________________________________________   _______________________________________________________  If your blood pressure at your visit was 140/90 or greater, please contact your primary care physician to follow up on this.  _______________________________________________________  If you are age 17 or older, your body mass index should be between 23-30. Your Body mass index is 31.3 kg/m. If this is out of the aforementioned range listed, please consider follow up with your Primary Care Provider.  If you are age 76 or younger, your body mass index should be between 19-25. Your Body mass index is 31.3 kg/m. If this is out of the aformentioned range listed, please consider follow up with your Primary Care Provider.   ________________________________________________________  The [facility] providers would like to encourage you to use MYCHART to communicate with providers for non-urgent requests or questions.  Due to long hold times on the telephone, sending your provider a message by [facility] may be a faster and more efficient way to get a response.  Please allow 48 business hours for a response.  Please  remember that this is for non-urgent requests.  _______________________________________________________  [facility] is using a team-based approach to care.  Your team is made up of your doctor and two to three APPS. Our APPS (Nurse Practitioners and Physician Assistants) work with your physician to ensure care continuity for you. They are fully qualified to address your health concerns and develop a treatment plan. They communicate directly with your gastroenterologist to care for you. Seeing the Advanced Practice Practitioners on your physician's team can help you by facilitating care more promptly, often allowing for earlier appointments, access to diagnostic testing, procedures, and other specialty referrals.   Thank you for choosing me and [facility].  Camie Furbish, PA-C

## 2024-09-02 ENCOUNTER — Encounter: Payer: Self-pay | Admitting: Internal Medicine

## 2024-09-02 ENCOUNTER — Ambulatory Visit: Admitting: Internal Medicine

## 2024-09-02 VITALS — BP 119/80 | HR 73 | Temp 98.2°F | Resp 16 | Ht 60.0 in | Wt 160.0 lb

## 2024-09-02 DIAGNOSIS — K449 Diaphragmatic hernia without obstruction or gangrene: Secondary | ICD-10-CM

## 2024-09-02 DIAGNOSIS — R12 Heartburn: Secondary | ICD-10-CM

## 2024-09-02 DIAGNOSIS — R6881 Early satiety: Secondary | ICD-10-CM

## 2024-09-02 DIAGNOSIS — K3189 Other diseases of stomach and duodenum: Secondary | ICD-10-CM

## 2024-09-02 DIAGNOSIS — K2950 Unspecified chronic gastritis without bleeding: Secondary | ICD-10-CM

## 2024-09-02 MED ORDER — SODIUM CHLORIDE 0.9 % IV SOLN: 500.0000 mL | INTRAVENOUS | Status: DC

## 2024-09-02 NOTE — Progress Notes (Signed)
 History and Physical Interval Note:  09/02/2024 10:13 AM  Cassandra Little  has presented today for endoscopic procedure(s), with the diagnosis of  Encounter Diagnoses  Name Primary?   Early satiety Yes   Heartburn   .  The various methods of evaluation and treatment have been discussed with the patient and/or family. After consideration of risks, benefits and other options for treatment, the patient has consented to  the endoscopic procedure(s).   The patient's history has been reviewed, patient examined, no change in status, stable for endoscopic procedure(s).  I have reviewed the patient's chart and labs.  Questions were answered to the patient's satisfaction.     Lupita CHARLENA Commander, MD, NOLIA

## 2024-09-02 NOTE — Patient Instructions (Addendum)
 The esophagus looks great.  No acid damage seen. You still have a small hiatal hernia.  Part of the stomach has abnormal lining, I am not sure if this is blood vessels on the surface (something called GAVE) or if it is inflammation or gastritis.  I took biopsies.  It does not look anything like cancer or anything bad.  It could explain feeling full sooner than he used to.  I took biopsies to understand it better.  I will let you know the results and recommendations after I am able to review the pathology report.  I appreciate the opportunity to care for you. Lupita CHARLENA Commander, MD, FACG  YOU HAD AN ENDOSCOPIC PROCEDURE TODAY AT THE [facility]:   Refer to the procedure report that was given to you for any specific questions about what was found during the examination.  If the procedure report does not answer your questions, please call your gastroenterologist to clarify.  If you requested that your care partner not be given the details of your procedure findings, then the procedure report has been included in a sealed envelope for you to review at your convenience later.  YOU SHOULD EXPECT: Some feelings of bloating in the abdomen. Passage of more gas than usual.  Walking can help get rid of the air that was put into your GI tract during the procedure and reduce the bloating. If you had a lower endoscopy (such as a colonoscopy or flexible sigmoidoscopy) you may notice spotting of blood in your stool or on the toilet paper. If you underwent a bowel prep for your procedure, you may not have a normal bowel movement for a few days.  Please Note:  You might notice some irritation and congestion in your nose or some drainage.  This is from the oxygen  used during your procedure.  There is no need for concern and it should clear up in a day or so.  SYMPTOMS TO REPORT IMMEDIATELY: Following upper endoscopy (EGD)  Vomiting of blood or coffee ground material  New chest pain or pain under the  shoulder blades  Painful or persistently difficult swallowing  New shortness of breath  Fever of 100F or higher  Black, tarry-looking stools  For urgent or emergent issues, a gastroenterologist can be reached at any hour by calling [phone]. Do not use MyChart messaging for urgent concerns.    DIET:  We do recommend a small meal at first, but then you may proceed to your regular diet.  Drink plenty of fluids but you should avoid alcoholic beverages for 24 hours.  ACTIVITY:  You should plan to take it easy for the rest of today and you should NOT DRIVE or use heavy machinery until tomorrow (because of the sedation medicines used during the test).    FOLLOW UP: Our staff will call the number listed on your records the next business day following your procedure.  We will call around 7:15- 8:00 am to check on you and address any questions or concerns that you may have regarding the information given to you following your procedure. If we do not reach you, we will leave a message.     If any biopsies were taken you will be contacted by phone or by letter within the next 1-3 weeks.  Please call us  at [phone] if you have not heard about the biopsies in 3 weeks.    SIGNATURES/CONFIDENTIALITY: You and/or your care partner have signed paperwork which will be entered into  your electronic medical record.  These signatures attest to the fact that that the information above on your After Visit Summary has been reviewed and is understood.  Full responsibility of the confidentiality of this discharge information lies with you and/or your care-partner.

## 2024-09-02 NOTE — Op Note (Signed)
 [facility] Patient Name: Cassandra Little Procedure Date: 09/02/2024 10:20 AM MRN: [id number] Endoscopist: Lupita FORBES Commander , MD, [id number] Age: 78 Referring MD:  Date of Birth: 1945/12/14 Gender: Female Account #: [id number] Procedure:                Upper GI endoscopy Indications:              Heartburn, Early satiety Medicines:                Monitored Anesthesia Care Procedure:                Pre-Anesthesia Assessment:                           - Prior to the procedure, a History and Physical                            was performed, and patient medications and                            allergies were reviewed. The patient's tolerance of                            previous anesthesia was also reviewed. The risks                            and benefits of the procedure and the sedation                            options and risks were discussed with the patient.                            All questions were answered, and informed consent                            was obtained. Prior Anticoagulants: The patient has                            taken no anticoagulant or antiplatelet agents. ASA                            Grade Assessment: II - A patient with mild systemic                            disease. After reviewing the risks and benefits,                            the patient was deemed in satisfactory condition to                            undergo the procedure.                           After obtaining informed consent, the endoscope was  passed under direct vision. Throughout the                            procedure, the patient's blood pressure, pulse, and                            oxygen  saturations were monitored continuously. The                            GIF [id number] #[id number] was introduced through the                            mouth, and advanced to the second part of duodenum.                            The upper GI endoscopy was  accomplished without                            difficulty. The patient tolerated the procedure                            well. Scope In: Scope Out: Findings:                 Diffuse moderate mucosal changes characterized by                            erythema and an increased vascular pattern were                            found in the gastric antrum. Biopsies were taken                            with a cold forceps for histology. Verification of                            patient identification for the specimen was done.                            Estimated blood loss was minimal.                           A 2 cm hiatal hernia was present.                           The gastroesophageal flap valve was visualized                            endoscopically and classified as Hill Grade IV (no                            fold, wide open lumen, hiatal hernia present).                           The exam was otherwise  without abnormality.                           The cardia and gastric fundus were otherwise normal                            on retroflexion. Complications:            No immediate complications. Estimated Blood Loss:     Estimated blood loss was minimal. Impression:               - Erythematous and increased vascular pattern                            mucosa in the antrum. Biopsied. ? GAVE vs gastritis                           - 2 cm hiatal hernia.                           - Gastroesophageal flap valve classified as Hill                            Grade IV (no fold, wide open lumen, hiatal hernia                            present).                           - The examination was otherwise normal. Recommendation:           - Patient has a contact number available for                            emergencies. The signs and symptoms of potential                            delayed complications were discussed with the                            patient. Return to normal  activities tomorrow.                            Written discharge instructions were provided to the                            patient.                           - Resume previous diet.                           - Continue present medications.                           - Await pathology results. Lupita FORBES Commander, MD 09/02/2024 10:39:41 AM This report has been signed electronically.

## 2024-09-02 NOTE — Progress Notes (Signed)
 Report given to PACU, vss

## 2024-09-02 NOTE — Progress Notes (Signed)
1021 Robinul 0.1 mg IV given due large amount of secretions upon assessment.  MD made aware, vss 

## 2024-09-02 NOTE — Progress Notes (Signed)
 Pt's states no medical or surgical changes since previsit or office visit.

## 2024-09-02 NOTE — Progress Notes (Signed)
 Called to room to assist during endoscopic procedure.  Patient ID and intended procedure confirmed with present staff. Received instructions for my participation in the procedure from the performing physician.

## 2024-09-03 ENCOUNTER — Telehealth: Payer: Self-pay | Admitting: *Deleted

## 2024-09-03 NOTE — Telephone Encounter (Signed)
  Follow up Call-     09/02/2024    8:58 AM  Call back number  Post procedure Call Back phone  # [phone]  Permission to leave phone message Yes     Patient questions:  Do you have a fever, pain , or abdominal swelling? No. Pain Score  0 *  Have you tolerated food without any problems? Yes.    Have you been able to return to your normal activities? Yes.    Do you have any questions about your discharge instructions: Diet   No. Medications  No. Follow up visit  No.  Do you have questions or concerns about your Care? No.  Actions: * If pain score is 4 or above: No action needed, pain <4.

## 2024-09-04 LAB — SURGICAL PATHOLOGY

## 2024-09-07 ENCOUNTER — Other Ambulatory Visit: Payer: Self-pay | Admitting: Internal Medicine

## 2024-09-13 ENCOUNTER — Ambulatory Visit: Payer: Self-pay | Admitting: Internal Medicine

## 2024-09-21 ENCOUNTER — Other Ambulatory Visit: Payer: Self-pay | Admitting: Internal Medicine

## 2024-10-01 ENCOUNTER — Telehealth: Payer: Self-pay | Admitting: Internal Medicine

## 2024-10-01 NOTE — Telephone Encounter (Signed)
°*  STAT* If patient is at the pharmacy, call can be transferred to refill team.   1. Which medications need to be refilled? (please list name of each medication and dose if known)  osuvastatin (CRESTOR ) 20 MG tablet   2. Would you like to learn more about the convenience, safety, & potential cost savings by using the [facility]? no   3. Are you open to using the [facility] (Type [facility]. no   4. Which pharmacy/location (including street and city if local pharmacy) is medication to be sent to?  [facility] - [place], [place] - [address]     5. Do they need a 30 day or 90 day supply? 90 day

## 2024-10-02 MED ORDER — ROSUVASTATIN CALCIUM 20 MG PO TABS: 20.0000 mg | ORAL_TABLET | Freq: Every day | ORAL | 0 refills | Status: AC

## 2024-10-02 NOTE — Telephone Encounter (Signed)
 Refill sent, enough to get pt to her appointment 10/19/24.

## 2024-10-12 ENCOUNTER — Other Ambulatory Visit: Payer: Self-pay | Admitting: Internal Medicine

## 2024-10-12 ENCOUNTER — Other Ambulatory Visit: Payer: Self-pay | Admitting: Physician Assistant

## 2024-10-12 DIAGNOSIS — F411 Generalized anxiety disorder: Secondary | ICD-10-CM

## 2024-10-14 ENCOUNTER — Other Ambulatory Visit: Payer: Self-pay | Admitting: Internal Medicine

## 2024-10-14 NOTE — Telephone Encounter (Signed)
 Lvm for patient to call to make an appointment

## 2024-10-14 NOTE — Telephone Encounter (Signed)
 Good Morning Everyone,  Please call pt to schedule her next appt. , she was due to return in Nov.  Thank you

## 2024-10-16 NOTE — Telephone Encounter (Signed)
 Pt has appt 2/4

## 2024-10-19 ENCOUNTER — Ambulatory Visit: Attending: Internal Medicine | Admitting: Internal Medicine

## 2024-10-19 VITALS — BP 134/75 | HR 77 | Ht 60.0 in | Wt 160.0 lb

## 2024-10-19 DIAGNOSIS — R931 Abnormal findings on diagnostic imaging of heart and coronary circulation: Secondary | ICD-10-CM

## 2024-10-19 DIAGNOSIS — I493 Ventricular premature depolarization: Secondary | ICD-10-CM | POA: Diagnosis not present

## 2024-10-19 DIAGNOSIS — E785 Hyperlipidemia, unspecified: Secondary | ICD-10-CM | POA: Diagnosis not present

## 2024-10-19 DIAGNOSIS — R0609 Other forms of dyspnea: Secondary | ICD-10-CM

## 2024-10-19 NOTE — Progress Notes (Signed)
 "   LIPID CLINIC CONSULT NOTE  Chief Complaint:  Follow-up dyslipidemia  Primary Care Physician: Cassandra Aloysius BRAVO, MD  Primary Cardiologist:  None  HPI:  Cassandra Little is a 79 y.o. female who is being seen today for the evaluation of dyslipidemia at the request of Cassandra Aloysius BRAVO, MD. this is a pleasant 79 year old female kindly referred for evaluation management of dyslipidemia.  She has very little past medical history and is generally been healthy.  She exercises regularly and eats a fairly healthy diet.  She does have some heart disease in her family including her mom who had some heart failure in her 71s and her father was diabetic and had an MI at around 53 as well.  Recently she has had an increase in her cholesterol.  Total cholesterol in March was 225, HDL 56, LDL 135 and triglycerides 829.  She has not been on therapy for this.  After discussion with her primary doctor, she started on over-the-counter red yeast rice.  There was a recommendation to refer her to the lipid clinic for further risk stratification.  Other risk factors include a history hypertension which is now controlled without medication.  12/06/2021  [name] returns today for follow-up of her lipids.  She underwent calcium  scoring which demonstrated an elevated calcium  score 59.8, 57 percentile for age and sex matched controls.  Based on this she was started on rosuvastatin  20 mg daily for 50% reduction of her lipids however did not achieve this.  Particle numbers went to 1289 from 1619.  LDL now 106 down from 155.  Total cholesterol was 299 down to 187.  This represents less than 50% reduction which would be expected for the medication.  She says that she has been compliant with the medicine.  She also notes no significant changes in her diet, weight gain or less physical activity that could explain this.  Based on that I am concerned there may be an elevation in LP(a) to explain this.  04/13/2022  [name] returns today  for follow-up.  She is done well on combination of ezetimibe  and rosuvastatin .  Her cholesterol profile is markedly improved.  Total cholesterol now 112, triglycerides 55, HDL 61 and LDL 38.  LDL particle number is 618, small LDL particle number is 420.  Overall significant improvement in her lipids.  05/08/2023  [name] returns today for follow-up.  She has done well on continuing therapy with rosuvastatin  and ezetimibe .  Recent lipids showed LDL particle #521, LDL-C of 40, HDL-C is 59 and triglycerides were 95.  Total cholesterol 117.  Small LDL particle #326.  She has no side effects from the medications.  She remains physically active.  She is highly involved in yoga and says she wants to start more exertional physical activity.  I have no concerns about this.  Although she had some coronary calcium  advised her to start exercise slowly and try to workup increased with that.  She should try to target heart rate around 110-120 based on her age and the Bruce formula.  10/19/2024  Cassandra Little returns today for follow-up.  She is overdue for repeat lipids.  She continues to be active and exercises regularly.  She denies any chest pain but does report some shortness of breath.  This is primarily noted with exertion such as when she was walking upstairs putting Christmas decorations away.  She gets occasional palpitations.  An EKG today shows a PVC.  She reports compliance with her lipid-lowering  therapies.  PMHx:  Past Medical History:  Diagnosis Date   Anaphylaxis 02/14/2011   Unknown cause.  Always carries Epipen .   Angioedema    Anxiety    Arthritis    Cough    Depression    Dr. Alroy   Exposure to hepatitis C    GERD (gastroesophageal reflux disease)    Hyperlipidemia    Hypertension    IBS (irritable bowel syndrome)    Internal and external bleeding hemorrhoids    Migraine    PONV (postoperative nausea and vomiting)    Rosacea     Past Surgical History:  Procedure Laterality Date    ABDOMINOPLASTY  2000   APPENDECTOMY  2004   AUGMENTATION MAMMAPLASTY Bilateral 2011   CESAREAN SECTION  1982, 1985   x 2   COLONOSCOPY  09/15/2008   internal and external hemorroids   COSMETIC SURGERY     multiple   HEMORRHOID BANDING  2014   REDUCTION MAMMAPLASTY Bilateral 2002   TONSILLECTOMY  1962   TOTAL KNEE ARTHROPLASTY Right 12/26/2015   Procedure: TOTAL KNEE ARTHROPLASTY;  Surgeon: Donnice Car, MD;  Location: [facility];  Service: Orthopedics;  Laterality: Right;   TOTAL KNEE ARTHROPLASTY Left 04/25/2021   Procedure: TOTAL KNEE ARTHROPLASTY;  Surgeon: Car Donnice, MD;  Location: [facility];  Service: Orthopedics;  Laterality: Left;  70 mins   TOTAL SHOULDER REPLACEMENT Bilateral 2009   right   TOTAL SHOULDER REPLACEMENT     UPPER GASTROINTESTINAL ENDOSCOPY  11/26/2018    FAMHx:  Family History  Problem Relation Age of Onset   Hyperlipidemia Mother    Heart disease Mother    Allergic rhinitis Mother    Eczema Mother    Diabetes Father    Hypertension Father    Heart attack Father        11s   Heart disease Father    Obesity Sister    Hypertension Sister    Hyperlipidemia Sister    Anxiety disorder Daughter    Depression Daughter    Depression Son    Colon cancer Neg Hx    Stomach cancer Neg Hx    Rectal cancer Neg Hx    Pancreatic cancer Neg Hx    Esophageal cancer Neg Hx    Angioedema Neg Hx    Asthma Neg Hx    Atopy Neg Hx    Immunodeficiency Neg Hx    Urticaria Neg Hx    Breast cancer Neg Hx     SOCHx:   reports that she has never smoked. She has never used smokeless tobacco. She reports current alcohol  use. She reports that she does not use drugs.  ALLERGIES:  Allergies  Allergen Reactions   Ace Inhibitors Cough    ROS: Pertinent items noted in HPI and remainder of comprehensive ROS otherwise negative.  HOME MEDS: Current Outpatient Medications on File Prior to Visit  Medication Sig Dispense Refill   ALPRAZolam  (XANAX ) 0.5 MG tablet Take 1  tablet (0.5 mg total) by mouth 2 (two) times daily as needed for anxiety. 30 tablet 2   b complex vitamins tablet Take 1 tablet by mouth daily.     Bacillus Coagulans-Inulin (PROBIOTIC-PREBIOTIC PO) Take by mouth.     busPIRone  (BUSPAR ) 15 MG tablet TAKE 1 TABLET BY MOUTH 3 TIMES  DAILY 270 tablet 0   cetirizine (ZYRTEC) 10 MG tablet Take 10 mg by mouth daily as needed for allergies.     cholecalciferol (VITAMIN D ) 1000 units tablet Take 1,000 Units  by mouth daily.     COLLAGEN PO Take 1 Scoop by mouth daily.     eletriptan  (RELPAX ) 20 MG tablet Take 1 tablet (20 mg total) by mouth as needed for migraine or headache. May repeat in 2 hours if headache persists or recurs. 10 tablet 5   EPINEPHrine  (EPIPEN  2-PAK) 0.3 mg/0.3 mL IJ SOAJ injection INJECT 0.3 MLS (0.3 MG TOTAL) INTO THE MUSCLE ONCE. 2 each 1   esomeprazole  (NEXIUM ) 40 MG capsule Take 1 capsule (40 mg total) by mouth daily before breakfast. 90 capsule 1   ezetimibe  (ZETIA ) 10 MG tablet TAKE 1 TABLET BY MOUTH DAILY 15 tablet 0   famotidine  (PEPCID ) 20 MG tablet Take 1 tablet (20 mg total) by mouth at bedtime. 30 tablet 3   fluorouracil (EFUDEX) 5 % cream daily. Stop in 2 weeks     FLUoxetine  (PROZAC ) 40 MG capsule TAKE 1 CAPSULE BY MOUTH DAILY 90 capsule 0   fluticasone  (FLONASE ) 50 MCG/ACT nasal spray Place 2 sprays into both nostrils daily. 48 mL 1   hydrocortisone  (ANUSOL -HC) 2.5 % rectal cream Place 1 Application rectally 2 (two) times daily as needed for hemorrhoids or anal itching. 30 g 1   metFORMIN  (GLUCOPHAGE -XR) 500 MG 24 hr tablet Take 1 tablet (500 mg total) by mouth 2 (two) times daily with a meal. 180 tablet 1   naproxen sodium (ALEVE) 220 MG tablet Take 220 mg by mouth daily as needed.     rosuvastatin  (CRESTOR ) 20 MG tablet Take 1 tablet (20 mg total) by mouth daily. Please call and schedule a follow-up with Dr. Mona for further refills. Thank you. 20 tablet 0   No current facility-administered medications on file prior  to visit.    LABS/IMAGING: No results found for this or any previous visit (from the past 48 hours). No results found.  LIPID PANEL:    Component Value Date/Time   CHOL 225 (H) 12/27/2020 1307   CHOL 195 08/15/2018 1709   TRIG 170.0 (H) 12/27/2020 1307   HDL 56.00 12/27/2020 1307   HDL 54 08/15/2018 1709   CHOLHDL 4 12/27/2020 1307   VLDL 34.0 12/27/2020 1307   LDLCALC 135 (H) 12/27/2020 1307   LDLCALC 120 (H) 08/15/2018 1709   LDLDIRECT 146.0 11/09/2015 1351    WEIGHTS: Wt Readings from Last 3 Encounters:  10/19/24 160 lb (72.6 kg)  09/02/24 160 lb (72.6 kg)  08/27/24 160 lb 4 oz (72.7 kg)    VITALS: BP 134/75 (BP Location: Right Arm)   Pulse 77   Ht 5' (1.524 m)   Wt 160 lb (72.6 kg)   SpO2 96%   BMI 31.25 kg/m   EXAM: General appearance: alert and no distress Lungs: clear to auscultation bilaterally Heart: regular rate and rhythm, S1, S2 normal, no murmur, click, rub or gallop Extremities: extremities normal, atraumatic, no cyanosis or edema Neurologic: Grossly normal  EKG: EKG Interpretation Date/Time:  Monday October 19 2024 10:19:16 EST Ventricular Rate:  77 PR Interval:  146 QRS Duration:  78 QT Interval:  382 QTC Calculation: 432 R Axis:   -13  Text Interpretation: Sinus rhythm with occasional Premature ventricular complexes When compared with ECG of 22-Jul-2002 15:52, Premature ventricular complexes are now Present Questionable change in QRS axis Confirmed by Mona Kent [phone]) on 10/19/2024 10:37:49 AM    ASSESSMENT: Dyspnea Palpitations/PVC's Mixed dyslipidemia, goal LDL <70 10-year ASCVD risk of 11.1% by pooled cohort equation Family history of later onset coronary disease/CHF in both parents Elevated CAC  score of 59.8, 57th percentile (07/2021)  PLAN: 1.   Cassandra Little has recently had some dyspnea and palpitations and was noted to have a PVC today.  She does have some underlying coronary artery disease but no chest pain symptoms with  activity.  I like to get an echocardiogram to make sure there is no structural abnormality that is causing this.  She will need repeat lipids and I will adjust her medications accordingly.  Plan otherwise follow-up annually or sooner as necessary.  Vinie KYM Maxcy, MD, [facility], FNLA, FACP  [place]  [facility]  Medical Director of the Advanced Lipid Disorders &  Cardiovascular Risk Reduction Clinic Diplomate of the American Board of Clinical Lipidology Attending Cardiologist  Direct Dial: [phone]  Fax: [phone]  Website:  [email or web address] Vinie BROCKS Emerly Prak 10/19/2024, 10:37 AM  "

## 2024-10-19 NOTE — Patient Instructions (Signed)
 Medication Instructions:   NO CHANGES  *If you need a refill on your cardiac medications before your next appointment, please call your pharmacy*  Lab Work:  FASTING NMR lipoprofile   If you have labs (blood work) drawn today and your tests are completely normal, you will receive your results only by: MyChart Message (if you have MyChart) OR A paper copy in the mail If you have any lab test that is abnormal or we need to change your treatment, we will call you to review the results.  Testing/Procedures:  Your physician has requested that you have an echocardiogram. Echocardiography is a painless test that uses sound waves to create images of your heart. It provides your doctor with information about the size and shape of your heart and how well your hearts chambers and valves are working. This procedure takes approximately one hour. There are no restrictions for this procedure. Please do NOT wear cologne, perfume, aftershave, or lotions (deodorant is allowed). Please arrive 15 minutes prior to your appointment time.  Please note: We ask at that you not bring children with you during ultrasound (echo/ vascular) testing. Due to room size and safety concerns, children are not allowed in the ultrasound rooms during exams. Our front office staff cannot provide observation of children in our lobby area while testing is being conducted. An adult accompanying a patient to their appointment will only be allowed in the ultrasound room at the discretion of the ultrasound technician under special circumstances. We apologize for any inconvenience.   Follow-Up: At [facility], you and your health needs are our priority.  As part of our continuing mission to provide you with exceptional heart care, our providers are all part of one team.  This team includes your primary Cardiologist (physician) and Advanced Practice Providers or APPs (Physician Assistants and Nurse Practitioners) who all work  together to provide you with the care you need, when you need it.  Your next appointment:    12 months with Dr. Mona  We recommend signing up for the patient portal called MyChart.  Sign up information is provided on this After Visit Summary.  MyChart is used to connect with patients for Virtual Visits (Telemedicine).  Patients are able to view lab/test results, encounter notes, upcoming appointments, etc.  Non-urgent messages can be sent to your provider as well.   To learn more about what you can do with MyChart, go to [email or web address].   Other Instructions

## 2024-10-27 ENCOUNTER — Encounter: Payer: Medicare Other | Admitting: Internal Medicine

## 2024-10-27 ENCOUNTER — Ambulatory Visit: Admitting: Internal Medicine

## 2024-10-27 VITALS — BP 126/78 | HR 78 | Temp 98.3°F | Resp 16 | Ht 60.0 in | Wt 158.5 lb

## 2024-10-27 DIAGNOSIS — F419 Anxiety disorder, unspecified: Secondary | ICD-10-CM

## 2024-10-27 DIAGNOSIS — Z0000 Encounter for general adult medical examination without abnormal findings: Secondary | ICD-10-CM

## 2024-10-27 DIAGNOSIS — E785 Hyperlipidemia, unspecified: Secondary | ICD-10-CM

## 2024-10-27 DIAGNOSIS — E559 Vitamin D deficiency, unspecified: Secondary | ICD-10-CM | POA: Diagnosis not present

## 2024-10-27 DIAGNOSIS — R519 Headache, unspecified: Secondary | ICD-10-CM

## 2024-10-27 DIAGNOSIS — F32A Depression, unspecified: Secondary | ICD-10-CM | POA: Diagnosis not present

## 2024-10-27 DIAGNOSIS — G43809 Other migraine, not intractable, without status migrainosus: Secondary | ICD-10-CM | POA: Diagnosis not present

## 2024-10-27 DIAGNOSIS — Z0001 Encounter for general adult medical examination with abnormal findings: Secondary | ICD-10-CM

## 2024-10-27 DIAGNOSIS — R739 Hyperglycemia, unspecified: Secondary | ICD-10-CM

## 2024-10-27 LAB — CBC WITH DIFFERENTIAL/PLATELET
Basophils Absolute: 0 K/uL (ref 0.0–0.1)
Basophils Relative: 0.6 % (ref 0.0–3.0)
Eosinophils Absolute: 0.1 K/uL (ref 0.0–0.7)
Eosinophils Relative: 2.7 % (ref 0.0–5.0)
HCT: 40.9 % (ref 36.0–46.0)
Hemoglobin: 13.6 g/dL (ref 12.0–15.0)
Lymphocytes Relative: 38.7 % (ref 12.0–46.0)
Lymphs Abs: 2.2 K/uL (ref 0.7–4.0)
MCHC: 33.3 g/dL (ref 30.0–36.0)
MCV: 86.6 fl (ref 78.0–100.0)
Monocytes Absolute: 0.5 K/uL (ref 0.1–1.0)
Monocytes Relative: 8.7 % (ref 3.0–12.0)
Neutro Abs: 2.8 K/uL (ref 1.4–7.7)
Neutrophils Relative %: 49.3 % (ref 43.0–77.0)
Platelets: 220 K/uL (ref 150.0–400.0)
RBC: 4.72 Mil/uL (ref 3.87–5.11)
RDW: 14.4 % (ref 11.5–15.5)
WBC: 5.6 K/uL (ref 4.0–10.5)

## 2024-10-27 LAB — VITAMIN D 25 HYDROXY (VIT D DEFICIENCY, FRACTURES): VITD: 66.65 ng/mL (ref 30.00–100.00)

## 2024-10-27 LAB — COMPREHENSIVE METABOLIC PANEL WITH GFR
ALT: 19 U/L (ref 3–35)
AST: 22 U/L (ref 5–37)
Albumin: 4.2 g/dL (ref 3.5–5.2)
Alkaline Phosphatase: 58 U/L (ref 39–117)
BUN: 21 mg/dL (ref 6–23)
CO2: 28 meq/L (ref 19–32)
Calcium: 9.5 mg/dL (ref 8.4–10.5)
Chloride: 103 meq/L (ref 96–112)
Creatinine, Ser: 0.99 mg/dL (ref 0.40–1.20)
GFR: 55.42 mL/min — ABNORMAL LOW
Glucose, Bld: 145 mg/dL — ABNORMAL HIGH (ref 70–99)
Potassium: 4.5 meq/L (ref 3.5–5.1)
Sodium: 139 meq/L (ref 135–145)
Total Bilirubin: 0.4 mg/dL (ref 0.2–1.2)
Total Protein: 6.7 g/dL (ref 6.0–8.3)

## 2024-10-27 LAB — HEMOGLOBIN A1C: Hgb A1c MFr Bld: 5.9 % (ref 4.6–6.5)

## 2024-10-27 LAB — TSH: TSH: 2.47 u[IU]/mL (ref 0.35–5.50)

## 2024-10-27 MED ORDER — ACETAMINOPHEN 500 MG PO TABS: 1000.0000 mg | ORAL_TABLET | Freq: Three times a day (TID) | ORAL | Status: AC | PRN

## 2024-10-27 NOTE — Patient Instructions (Addendum)
 Please read your instructions carefully.     Go to the front desk for the checkout Please make an appointment for a physical exam in 1 year  For the headaches: Recommend to stop Aleve, it may affect your stomach and your kidney Instead use Tylenol  500 mg: Up to 2 tablets every 8 hours as needed. Also use consistently Zyrtec and Flonase . If your headaches are not improving please let me know.  Vaccines I recommend: RSV

## 2024-10-27 NOTE — Progress Notes (Unsigned)
 "  Subjective:    Patient ID: Cassandra Little, female    DOB: 11-19-1945, 79 y.o.   MRN: [id number]  DOS:  10/27/2024 CPX Here for CPX. In general feeling well. He has chronic anxiety and remains somewhat emotional.  Denies any suicidal ideas Also for several months he is having frontal headache, no associated with nausea vomiting.  No the worst of her life.  Thinks it is a sinus headache. Taking Aleve frequently. Has some nasal congestion but not severe. She also has migraines but they are very infrequent.  Denies chest pain or difficulty breathing at this point  Review of Systems See above   Past Medical History:  Diagnosis Date   Anaphylaxis 02/14/2011   Unknown cause.  Always carries Epipen .   Angioedema    Anxiety    Arthritis    Cough    Depression    Dr. Alroy   Exposure to hepatitis C    GERD (gastroesophageal reflux disease)    Hyperlipidemia    Hypertension    IBS (irritable bowel syndrome)    Internal and external bleeding hemorrhoids    Migraine    PONV (postoperative nausea and vomiting)    Rosacea     Past Surgical History:  Procedure Laterality Date   ABDOMINOPLASTY  2000   APPENDECTOMY  2004   AUGMENTATION MAMMAPLASTY Bilateral 2011   CESAREAN SECTION  1982, 1985   x 2   COLONOSCOPY  09/15/2008   internal and external hemorroids   COSMETIC SURGERY     multiple   HEMORRHOID BANDING  2014   REDUCTION MAMMAPLASTY Bilateral 2002   TONSILLECTOMY  1962   TOTAL KNEE ARTHROPLASTY Right 12/26/2015   Procedure: TOTAL KNEE ARTHROPLASTY;  Surgeon: Donnice Car, MD;  Location: [facility];  Service: Orthopedics;  Laterality: Right;   TOTAL KNEE ARTHROPLASTY Left 04/25/2021   Procedure: TOTAL KNEE ARTHROPLASTY;  Surgeon: Car Donnice, MD;  Location: [facility];  Service: Orthopedics;  Laterality: Left;  70 mins   TOTAL SHOULDER REPLACEMENT Bilateral 2009   right   TOTAL SHOULDER REPLACEMENT     UPPER GASTROINTESTINAL ENDOSCOPY  11/26/2018    Current Outpatient  Medications  Medication Instructions   ALPRAZolam  (XANAX ) 0.5 mg, Oral, 2 times daily PRN   b complex vitamins tablet 1 tablet, Daily   Bacillus Coagulans-Inulin (PROBIOTIC-PREBIOTIC PO) Take by mouth.   busPIRone  (BUSPAR ) 15 mg, Oral, 3 times daily   cetirizine (ZYRTEC) 10 mg, Daily PRN   cholecalciferol (VITAMIN D ) 1,000 Units, Daily   COLLAGEN PO 1 Scoop, Daily   eletriptan  (RELPAX ) 20 mg, Oral, As needed, May repeat in 2 hours if headache persists or recurs.   EPINEPHrine  (EPIPEN  2-PAK) 0.3 mg/0.3 mL IJ SOAJ injection INJECT 0.3 MLS (0.3 MG TOTAL) INTO THE MUSCLE ONCE.   esomeprazole  (NEXIUM ) 40 mg, Oral, Daily before breakfast   ezetimibe  (ZETIA ) 10 mg, Oral, Daily   famotidine  (PEPCID ) 20 mg, Oral, Daily at bedtime   fluorouracil (EFUDEX) 5 % cream Daily   FLUoxetine  (PROZAC ) 40 mg, Oral, Daily   fluticasone  (FLONASE ) 50 MCG/ACT nasal spray 2 sprays, Each Nare, Daily   hydrocortisone  (ANUSOL -HC) 2.5 % rectal cream 1 Application, Rectal, 2 times daily PRN   metFORMIN  (GLUCOPHAGE -XR) 500 mg, Oral, 2 times daily with meals   naproxen sodium (ALEVE) 220 mg, Daily PRN   rosuvastatin  (CRESTOR ) 20 mg, Oral, Daily, Please call and schedule a follow-up with Dr. Mona for further refills. Thank you.       Objective:  Physical Exam BP 126/78   Pulse 78   Temp 98.3 F (36.8 C) (Oral)   Resp 16   Ht 5' (1.524 m)   Wt 158 lb 8 oz (71.9 kg)   SpO2 99%   BMI 30.95 kg/m  General: Well developed, NAD, BMI noted Neck: No  thyromegaly  HEENT:  Normocephalic . Face symmetric, atraumatic Lungs:  CTA B Normal respiratory effort, no intercostal retractions, no accessory muscle use. Heart: RRR,  no murmur.  Abdomen:  Not distended, soft, non-tender. No rebound or rigidity.   Lower extremities: no pretibial edema bilaterally  Skin: Exposed areas without rash. Not pale. Not jaundice Neurologic:  alert & oriented X3.  Speech normal, gait appropriate for age and unassisted Strength  symmetric and appropriate for age.  Psych: Cognition and judgment appear intact.  Cooperative with normal attention span and concentration.  Behavior appropriate. No anxious or depressed appearing.     Assessment     Assessment (transferring to this office due to location 06-2022) Hyperglycemia Depression-anxiety. Sees Dr Vincente  High cholesterol.  Seen at cardiology GERD Migraines (reports visual disturbances) DJD: B shoulder replacement; B TKR  ABX pre- dental d/t DJD H/o anaphylaxis- unknown trigger has a epipen  ; has seen several specialists  Vit D def Osteopenia: T-score -1.4  10/2022,  Fatigue, dizziness: Chronic per patient  PLAN Here for CPX Td 2024.  S/p shingrix PNM 23: 2014;PNM 13: 2018 ;PNM 20: 2023   Had a flu and COVID shot recently. Recommend: RSV  Has not seen gyn    MMG 10/2023 ([facility]) Cscope 2020, per letter polyps benign, no further routine colonoscopies.    Labs: CBC CMP A1c TSH vitamin D   Other issues: Hyperglycemia: Checking A1c Depression anxiety: Per psychiatry, she remains somewhat emotional but no suicidal ideas or severe symptoms. High cholesterol: Per cardiology GERD: Saw GI, had endoscopy, PPI dose increased, doing better. Headaches: For the last 2 to 3 months, no red flags, recommend consistent use of Zyrtec and Flonase , stop Aleve and try Tylenol  instead.  If she is not better she will let me know. She has migraines, a different type of headaches and they are very infrequent. Vitamin D  deficiency: Checking levels. Osteopenia: Next Dx: 2029 RTC 1 year or sooner if needed    "

## 2024-10-28 ENCOUNTER — Encounter: Payer: Self-pay | Admitting: Internal Medicine

## 2024-10-28 NOTE — Assessment & Plan Note (Signed)
 Here for CPX Td 2024.  S/p shingrix PNM 23: 2014;PNM 13: 2018 ;PNM 20: 2023   Had a flu and COVID shot recently. Recommend: RSV  Has not seen gyn    MMG 10/2023 ([facility]) Cscope 2020, per letter polyps benign, no further routine colonoscopies.    Labs: CBC CMP A1c TSH vitamin D 

## 2024-10-28 NOTE — Assessment & Plan Note (Signed)
 Here for CPX Other issues: Hyperglycemia: Checking A1c Depression anxiety: Per psychiatry, she remains somewhat emotional but no suicidal ideas or severe symptoms. High cholesterol: Per cardiology GERD: Saw GI, had endoscopy, PPI dose increased, doing better. Headaches: For the last 2 to 3 months, no red flags, located to the forehead, described as sinus headache. Plan: Consistent use of Zyrtec and Flonase , stop Aleve and try Tylenol  instead.  If she is not better she will let me know. She has migraines, a different type of headaches and they are very infrequent. Vitamin D  deficiency: Checking levels. Osteopenia: Next Dx: 2029 RTC 1 year or sooner if needed

## 2024-10-29 ENCOUNTER — Ambulatory Visit: Payer: Self-pay | Admitting: Internal Medicine

## 2024-11-03 ENCOUNTER — Ambulatory Visit (INDEPENDENT_AMBULATORY_CARE_PROVIDER_SITE_OTHER): Payer: Medicare Other

## 2024-11-03 VITALS — Ht 60.0 in | Wt 158.0 lb

## 2024-11-03 DIAGNOSIS — Z0000 Encounter for general adult medical examination without abnormal findings: Secondary | ICD-10-CM

## 2024-11-03 NOTE — Progress Notes (Signed)
 "  Chief Complaint  Patient presents with   Medicare Wellness     Subjective:   Cassandra Little is a 79 y.o. female who presents for a Medicare Annual Wellness Visit.  Visit info / Clinical Intake: Medicare Wellness Visit Type:: Subsequent Annual Wellness Visit Persons participating in visit and providing information:: patient Medicare Wellness Visit Mode:: Video Since this visit was completed virtually, some vitals may be partially provided or unavailable. Missing vitals are due to the limitations of the virtual format.: Documented vitals are patient reported If Telephone or Video please confirm:: I connected with patient using audio/video enable telemedicine. I verified patient identity with two identifiers, discussed telehealth limitations, and patient agreed to proceed. Patient Location:: Home Provider Location:: Office Interpreter Needed?: No Pre-visit prep was completed: yes AWV questionnaire completed by patient prior to visit?: yes Date:: 10/27/24 Living arrangements:: (!) lives alone Patient's Overall Health Status Rating: very good Typical amount of pain: none Does pain affect daily life?: no Are you currently prescribed opioids?: no  Dietary Habits and Nutritional Risks How many meals a day?: 2 Eats fruit and vegetables daily?: yes Most meals are obtained by: preparing own meals In the last 2 weeks, have you had any of the following?: none Diabetic:: no  Functional Status Activities of Daily Living (to include ambulation/medication): Independent Ambulation: Independent with device- listed below Home Assistive Devices/Equipment: Eyeglasses Medication Administration: Independent Home Management (perform basic housework or laundry): Independent Manage your own finances?: yes Primary transportation is: driving Concerns about vision?: no *vision screening is required for WTM* Concerns about hearing?: no  Fall Screening Falls in the past year?: 0 Number of falls in  past year: 0 Was there an injury with Fall?: 0 Fall Risk Category Calculator: 0 Patient Fall Risk Level: Low Fall Risk  Fall Risk Patient at Risk for Falls Due to: No Fall Risks Fall risk Follow up: Falls evaluation completed  Home and Transportation Safety: All rugs have non-skid backing?: yes All stairs or steps have railings?: yes Grab bars in the bathtub or shower?: (!) no Have non-skid surface in bathtub or shower?: (!) no Good home lighting?: yes Regular seat belt use?: yes Hospital stays in the last year:: no  Cognitive Assessment Difficulty concentrating, remembering, or making decisions? : no Will 6CIT or Mini Cog be Completed: yes What year is it?: 0 points What month is it?: 0 points Give patient an address phrase to remember (5 components): [address] [place]  About what time is it?: 0 points Count backwards from 20 to 1: 0 points Say the months of the year in reverse: 0 points Repeat the address phrase from earlier: 0 points 6 CIT Score: 0 points  Advance Directives (For Healthcare) Does Patient Have a Medical Advance Directive?: Yes Does patient want to make changes to medical advance directive?: No - Patient declined Type of Advance Directive: Healthcare Power of [place]; Living will Copy of Healthcare Power of Attorney in Chart?: No - copy requested Copy of Living Will in Chart?: No - copy requested  Reviewed/Updated  Reviewed/Updated: Reviewed All (Medical, Surgical, Family, Medications, Allergies, Care Teams, Patient Goals)    Allergies (verified) Ace inhibitors   Current Medications (verified) Outpatient Encounter Medications as of 11/03/2024  Medication Sig   ALPRAZolam  (XANAX ) 0.5 MG tablet Take 1 tablet (0.5 mg total) by mouth 2 (two) times daily as needed for anxiety.   b complex vitamins tablet Take 1 tablet by mouth daily.   Bacillus Coagulans-Inulin (PROBIOTIC-PREBIOTIC PO) Take by  mouth.   busPIRone  (BUSPAR ) 15 MG tablet TAKE 1  TABLET BY MOUTH 3 TIMES  DAILY   cetirizine (ZYRTEC) 10 MG tablet Take 10 mg by mouth daily as needed for allergies.   cholecalciferol (VITAMIN D ) 1000 units tablet Take 1,000 Units by mouth daily.   COLLAGEN PO Take 1 Scoop by mouth daily.   eletriptan  (RELPAX ) 20 MG tablet Take 1 tablet (20 mg total) by mouth as needed for migraine or headache. May repeat in 2 hours if headache persists or recurs.   EPINEPHrine  (EPIPEN  2-PAK) 0.3 mg/0.3 mL IJ SOAJ injection INJECT 0.3 MLS (0.3 MG TOTAL) INTO THE MUSCLE ONCE.   esomeprazole  (NEXIUM ) 40 MG capsule Take 1 capsule (40 mg total) by mouth daily before breakfast.   ezetimibe  (ZETIA ) 10 MG tablet TAKE 1 TABLET BY MOUTH DAILY   famotidine  (PEPCID ) 20 MG tablet Take 1 tablet (20 mg total) by mouth at bedtime.   fluorouracil (EFUDEX) 5 % cream daily. Stop in 2 weeks   FLUoxetine  (PROZAC ) 40 MG capsule TAKE 1 CAPSULE BY MOUTH DAILY   fluticasone  (FLONASE ) 50 MCG/ACT nasal spray Place 2 sprays into both nostrils daily.   hydrocortisone  (ANUSOL -HC) 2.5 % rectal cream Place 1 Application rectally 2 (two) times daily as needed for hemorrhoids or anal itching.   metFORMIN  (GLUCOPHAGE -XR) 500 MG 24 hr tablet Take 1 tablet (500 mg total) by mouth 2 (two) times daily with a meal.   rosuvastatin  (CRESTOR ) 20 MG tablet Take 1 tablet (20 mg total) by mouth daily. Please call and schedule a follow-up with Dr. Mona for further refills. Thank you.   acetaminophen  (TYLENOL ) 500 MG tablet Take 2 tablets (1,000 mg total) by mouth every 8 (eight) hours as needed.   No facility-administered encounter medications on file as of 11/03/2024.    History: Past Medical History:  Diagnosis Date   Anaphylaxis 02/14/2011   Unknown cause.  Always carries Epipen .   Angioedema    Anxiety    Arthritis    Cough    Depression    Dr. Alroy   Exposure to hepatitis C    GERD (gastroesophageal reflux disease)    Hyperlipidemia    Hypertension    IBS (irritable bowel syndrome)     Internal and external bleeding hemorrhoids    Migraine    PONV (postoperative nausea and vomiting)    Rosacea    Past Surgical History:  Procedure Laterality Date   ABDOMINOPLASTY  2000   APPENDECTOMY  2004   AUGMENTATION MAMMAPLASTY Bilateral 2011   CESAREAN SECTION  1982, 1985   x 2   COLONOSCOPY  09/15/2008   internal and external hemorroids   COSMETIC SURGERY     multiple   HEMORRHOID BANDING  2014   REDUCTION MAMMAPLASTY Bilateral 2002   TONSILLECTOMY  1962   TOTAL KNEE ARTHROPLASTY Right 12/26/2015   Procedure: TOTAL KNEE ARTHROPLASTY;  Surgeon: Donnice Car, MD;  Location: [facility];  Service: Orthopedics;  Laterality: Right;   TOTAL KNEE ARTHROPLASTY Left 04/25/2021   Procedure: TOTAL KNEE ARTHROPLASTY;  Surgeon: Car Donnice, MD;  Location: [facility];  Service: Orthopedics;  Laterality: Left;  70 mins   TOTAL SHOULDER REPLACEMENT Bilateral 2009   right   TOTAL SHOULDER REPLACEMENT     UPPER GASTROINTESTINAL ENDOSCOPY  11/26/2018   Family History  Problem Relation Age of Onset   Hyperlipidemia Mother    Heart disease Mother    Allergic rhinitis Mother    Eczema Mother    Diabetes Father  Hypertension Father    Heart attack Father        80s   Heart disease Father    Obesity Sister    Hypertension Sister    Hyperlipidemia Sister    Anxiety disorder Daughter    Depression Daughter    Depression Son    Colon cancer Neg Hx    Stomach cancer Neg Hx    Rectal cancer Neg Hx    Pancreatic cancer Neg Hx    Esophageal cancer Neg Hx    Angioedema Neg Hx    Asthma Neg Hx    Atopy Neg Hx    Immunodeficiency Neg Hx    Urticaria Neg Hx    Breast cancer Neg Hx    Social History   Occupational History   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[employer]    Employer: [employer]    Comment: Retired  Tobacco Use   Smoking status: Never   Smokeless tobacco: Never  Vaping Use   Vaping status: Never Used  Substance and Sexual Activity   Alcohol  use: Yes    Comment: Infrequently    Drug use: No   Sexual activity: Not Currently   Tobacco Counseling Counseling given: No  SDOH Screenings   Food Insecurity: No Food Insecurity (11/03/2024)  Housing: Low Risk (11/03/2024)  Transportation Needs: No Transportation Needs (11/03/2024)  Utilities: Not At Risk (11/03/2024)  Alcohol  Screen: Low Risk (10/27/2024)  Depression (PHQ2-9): Low Risk (11/03/2024)  Financial Resource Strain: Low Risk (10/27/2024)  Physical Activity: Sufficiently Active (11/03/2024)  Social Connections: Moderately Integrated (11/03/2024)  Stress: Stress Concern Present (11/03/2024)  Tobacco Use: Low Risk (11/03/2024)  Health Literacy: Adequate Health Literacy (11/03/2024)   See flowsheets for full screening details  Depression Screen PHQ 2 & 9 Depression Scale- Over the past 2 weeks, how often have you been bothered by any of the following problems? Little interest or pleasure in doing things: 0 Feeling down, depressed, or hopeless (PHQ Adolescent also includes...irritable): 0 PHQ-2 Total Score: 0 Trouble falling or staying asleep, or sleeping too much: 0 Feeling tired or having little energy: 0 Poor appetite or overeating (PHQ Adolescent also includes...weight loss): 0 Feeling bad about yourself - or that you are a failure or have let yourself or your family down: 0 Trouble concentrating on things, such as reading the newspaper or watching television (PHQ Adolescent also includes...like school work): 0 Moving or speaking so slowly that other people could have noticed. Or the opposite - being so fidgety or restless that you have been moving around a lot more than usual: 0 Thoughts that you would be better off dead, or of hurting yourself in some way: 0 PHQ-9 Total Score: 0     Goals Addressed               This Visit's Progress     Stay Active (pt-stated)        Continue to lose weight. Stay independent!             Objective:    Today's Vitals   11/03/24 0809  Weight: 158 lb (71.7 kg)   Height: 5' (1.524 m)   Body mass index is 30.86 kg/m.  Hearing/Vision screen Hearing Screening - Comments:: Denies hearing difficulties   Vision Screening - Comments:: Wears rx glasses - up to date with routine eye exams with  [facility] Immunizations and Health Maintenance Health Maintenance  Topic Date Due   COVID-19 Vaccine (8 - Moderna risk 2025-26 season) 01/21/2025   Medicare Annual Wellness (AWV)  11/03/2025   DTaP/Tdap/Td (4 - Td or Tdap) 10/15/2032   Pneumococcal Vaccine: 50+ Years  Completed   Influenza Vaccine  Completed   Bone Density Scan  Completed   Hepatitis C Screening  Completed   Zoster Vaccines- Shingrix  Completed   Meningococcal B Vaccine  Aged Out   Hepatitis B Vaccines 19-59 Average Risk  Discontinued   Mammogram  Discontinued        Assessment/Plan:  This is a routine wellness examination for Cassandra Little.  Patient Care Team: Amon Aloysius BRAVO, MD as PCP - General (Internal Medicine) Ernie Cough, MD as Consulting Physician (Orthopedic Surgery) Cleotilde Ronal RAMAN, MD as Consulting Physician (Gynecology) Vincente Grip, MD as Consulting Physician (Psychiatry)  I have personally reviewed and noted the following in the patients chart:   Medical and social history Use of alcohol , tobacco or illicit drugs  Current medications and supplements including opioid prescriptions. Functional ability and status Nutritional status Physical activity Advanced directives List of other physicians Hospitalizations, surgeries, and ER visits in previous 12 months Vitals Screenings to include cognitive, depression, and falls Referrals and appointments  No orders of the defined types were placed in this encounter.  In addition, I have reviewed and discussed with patient certain preventive protocols, quality metrics, and best practice recommendations. A written personalized care plan for preventive services as well as general preventive health recommendations were provided  to patient.   Rojelio LELON Blush, LPN   8/79/7973   Return in 53 weeks (on 11/09/2025).  After Visit Summary: (MyChart) Due to this being a telephonic visit, the after visit summary with patients personalized plan was offered to patient via MyChart   Nurse Notes: No voiced or noted concerns at this time "

## 2024-11-03 NOTE — Patient Instructions (Addendum)
 Cassandra Little,  Thank you for taking the time for your Medicare Wellness Visit. I appreciate your continued commitment to your health goals. Please review the care plan we discussed, and feel free to reach out if I can assist you further.  Please note that Annual Wellness Visits do not include a physical exam. Some assessments may be limited, especially if the visit was conducted virtually. If needed, we may recommend an in-person follow-up with your provider.  Ongoing Care Seeing your primary care provider every 3 to 6 months helps us  monitor your health and provide consistent, personalized care.   Referrals If a referral was made during today's visit and you haven't received any updates within two weeks, please contact the referred provider directly to check on the status.  Recommended Screenings:  Health Maintenance  Topic Date Due   COVID-19 Vaccine (8 - Moderna risk 2025-26 season) 01/21/2025   Medicare Annual Wellness Visit  11/03/2025   DTaP/Tdap/Td vaccine (4 - Td or Tdap) 10/15/2032   Pneumococcal Vaccine for age over 54  Completed   Flu Shot  Completed   Osteoporosis screening with Bone Density Scan  Completed   Hepatitis C Screening  Completed   Zoster (Shingles) Vaccine  Completed   Meningitis B Vaccine  Aged Out   Hepatitis B Vaccine  Discontinued   Breast Cancer Screening  Discontinued       11/03/2024    8:13 AM  Advanced Directives  Does Patient Have a Medical Advance Directive? Yes  Type of Estate Agent of [place];Living will  Does patient want to make changes to medical advance directive? No - Patient declined  Copy of Healthcare Power of Attorney in Chart? No - copy requested    Vision: Annual vision screenings are recommended for early detection of glaucoma, cataracts, and diabetic retinopathy. These exams can also reveal signs of chronic conditions such as diabetes and high blood pressure.  Dental: Annual dental screenings help detect  early signs of oral cancer, gum disease, and other conditions linked to overall health, including heart disease and diabetes.  Please see the attached documents for additional preventive care recommendations.

## 2024-11-18 ENCOUNTER — Ambulatory Visit: Admitting: Physician Assistant

## 2024-11-18 ENCOUNTER — Encounter: Payer: Self-pay | Admitting: Physician Assistant

## 2024-11-18 DIAGNOSIS — F32A Depression, unspecified: Secondary | ICD-10-CM | POA: Diagnosis not present

## 2024-11-18 DIAGNOSIS — F411 Generalized anxiety disorder: Secondary | ICD-10-CM

## 2024-11-18 MED ORDER — BUSPIRONE HCL 15 MG PO TABS: 15.0000 mg | ORAL_TABLET | Freq: Three times a day (TID) | ORAL | 1 refills | Status: AC

## 2024-11-18 MED ORDER — FLUOXETINE HCL 40 MG PO CAPS: 40.0000 mg | ORAL_CAPSULE | Freq: Every day | ORAL | 1 refills | Status: AC

## 2024-11-18 NOTE — Progress Notes (Signed)
 "     [facility]  Patient ID: Cassandra Little,  MRN: [id number]  PCP: Amon Aloysius BRAVO, MD  Date of Evaluation: 11/18/2024 Time spent:20 minutes  Chief Complaint:  Chief Complaint   Anxiety; Depression; Follow-up    HISTORY/CURRENT STATUS: HPI  For routine 6 month med check.   She's doing well w/ current meds. Feels like they're still working. She is able to enjoy things.  Involved in political activism.  Listens to a lot of TED talks and trying to become more aware of things around her.  Energy and motivation are good.   No extreme sadness, tearfulness, or feelings of hopelessness.  Sleeps ok.  ADLs and personal hygiene are normal.   Denies any changes in concentration, making decisions, or remembering things.  Appetite has not changed.  Weight is stable.  Anxiety is controlled.  She rarely needs the Xanax  but it does help when needed.  No mania, delirium, AH/VH.  No SI/HI.  Individual Medical History/ Review of Systems: Changes? :No   Past Psychiatric Medication Trials: Prozac : Took for 20 years.  Lexapro - Seemed to be ok.  Wellbutrin  XL- No improvement or side effects.  Cymbalta - Xanax   Allergies: Ace inhibitors  Current Medications:  Current Outpatient Medications:    acetaminophen  (TYLENOL ) 500 MG tablet, Take 2 tablets (1,000 mg total) by mouth every 8 (eight) hours as needed., Disp: , Rfl:    ALPRAZolam  (XANAX ) 0.5 MG tablet, Take 1 tablet (0.5 mg total) by mouth 2 (two) times daily as needed for anxiety., Disp: 30 tablet, Rfl: 2   b complex vitamins tablet, Take 1 tablet by mouth daily., Disp: , Rfl:    Bacillus Coagulans-Inulin (PROBIOTIC-PREBIOTIC PO), Take by mouth., Disp: , Rfl:    cetirizine (ZYRTEC) 10 MG tablet, Take 10 mg by mouth daily as needed for allergies., Disp: , Rfl:    cholecalciferol (VITAMIN D ) 1000 units tablet, Take 1,000 Units by mouth daily., Disp: , Rfl:    COLLAGEN PO, Take 1 Scoop by mouth daily., Disp: , Rfl:    eletriptan  (RELPAX ) 20 MG  tablet, Take 1 tablet (20 mg total) by mouth as needed for migraine or headache. May repeat in 2 hours if headache persists or recurs., Disp: 10 tablet, Rfl: 5   EPINEPHrine  (EPIPEN  2-PAK) 0.3 mg/0.3 mL IJ SOAJ injection, INJECT 0.3 MLS (0.3 MG TOTAL) INTO THE MUSCLE ONCE., Disp: 2 each, Rfl: 1   esomeprazole  (NEXIUM ) 40 MG capsule, Take 1 capsule (40 mg total) by mouth daily before breakfast., Disp: 90 capsule, Rfl: 1   ezetimibe  (ZETIA ) 10 MG tablet, TAKE 1 TABLET BY MOUTH DAILY, Disp: 15 tablet, Rfl: 0   famotidine  (PEPCID ) 20 MG tablet, Take 1 tablet (20 mg total) by mouth at bedtime., Disp: 30 tablet, Rfl: 3   fluticasone  (FLONASE ) 50 MCG/ACT nasal spray, Place 2 sprays into both nostrils daily., Disp: 48 mL, Rfl: 1   hydrocortisone  (ANUSOL -HC) 2.5 % rectal cream, Place 1 Application rectally 2 (two) times daily as needed for hemorrhoids or anal itching., Disp: 30 g, Rfl: 1   metFORMIN  (GLUCOPHAGE -XR) 500 MG 24 hr tablet, Take 1 tablet (500 mg total) by mouth 2 (two) times daily with a meal., Disp: 180 tablet, Rfl: 1   rosuvastatin  (CRESTOR ) 20 MG tablet, Take 1 tablet (20 mg total) by mouth daily. Please call and schedule a follow-up with Dr. Mona for further refills. Thank you., Disp: 20 tablet, Rfl: 0   busPIRone  (BUSPAR ) 15 MG tablet, Take 1 tablet (  15 mg total) by mouth 3 (three) times daily., Disp: 270 tablet, Rfl: 1   fluorouracil (EFUDEX) 5 % cream, daily. Stop in 2 weeks, Disp: , Rfl:    FLUoxetine  (PROZAC ) 40 MG capsule, Take 1 capsule (40 mg total) by mouth daily., Disp: 90 capsule, Rfl: 1 Medication Side Effects: none  Family Medical/ Social History: Changes? No  MENTAL HEALTH EXAM:  There were no vitals taken for this visit.There is no height or weight on file to calculate BMI.  General Appearance: Casual and Well Groomed  Eye Contact:  Good  Speech:  Clear and Coherent and Normal Rate  Volume:  Normal  Mood:  Euthymic  Affect:  Congruent  Thought Process:  Goal Directed  and Descriptions of Associations: Circumstantial  Orientation:  Full (Time, Place, and Person)  Thought Content: Logical   Suicidal Thoughts:  No  Homicidal Thoughts:  No  Memory:  WNL  Judgement:  Good  Insight:  Good  Psychomotor Activity:  Normal  Concentration:  Concentration: Good  Recall:  Good  Fund of Knowledge: Good  Language: Good  Assets:  Communication Skills Desire for Improvement Financial Resources/Insurance Housing Transportation  ADL's:  Intact  Cognition: WNL  Prognosis:  Good   DIAGNOSES:    ICD-10-CM   1. Depression, unspecified depression type  F32.A     2. Generalized anxiety disorder  F41.1 busPIRone  (BUSPAR ) 15 MG tablet     Receiving Psychotherapy: Yes with Marval Bunde, LCSW.  RECOMMENDATIONS:  PDMP reviewed. Xanax  filled 04/21/2024.  I provided approximately  20 minutes of face to face time during this encounter, including time spent before and after the visit in records review, medical decision making, counseling pertinent to today's visit, and charting.   She is doing well as far as her medications go.  No changes need to be made.  Continue Xanax  0.5 mg, 1 p.o. twice daily as needed.   Continue BuSpar  15 mg, 1 p.o. 3 times daily.  (She usually takes 2 in the morning and 1 in the evening which is fine.) Continue Prozac  40 mg daily. Continue therapy with Marval Bunde, LCSW. Return in 6 months.   Verneita Cooks, PA-C  "

## 2024-11-20 ENCOUNTER — Ambulatory Visit: Payer: Self-pay | Admitting: Internal Medicine

## 2024-11-20 ENCOUNTER — Other Ambulatory Visit (HOSPITAL_BASED_OUTPATIENT_CLINIC_OR_DEPARTMENT_OTHER)

## 2024-11-20 DIAGNOSIS — I493 Ventricular premature depolarization: Secondary | ICD-10-CM

## 2024-11-20 LAB — ECHOCARDIOGRAM COMPLETE
AR max vel: 1.61 cm2
AV Area VTI: 1.64 cm2
AV Area mean vel: 1.51 cm2
AV Mean grad: 7 mmHg
AV Peak grad: 12.8 mmHg
AV Vena cont: 0.41 cm
Ao pk vel: 1.79 m/s
Area-P 1/2: 3.4 cm2
P 1/2 time: 599 ms
S' Lateral: 2.27 cm

## 2025-05-18 ENCOUNTER — Ambulatory Visit: Admitting: Physician Assistant

## 2025-11-09 ENCOUNTER — Ambulatory Visit
